# Patient Record
Sex: Male | Born: 1935
Health system: Southern US, Community
[De-identification: ages and names within clinical notes are randomized; demographics above are authoritative.]

## PROBLEM LIST (undated history)

## (undated) DIAGNOSIS — Z9989 Dependence on other enabling machines and devices: Principal | ICD-10-CM

## (undated) DIAGNOSIS — R519 Headache, unspecified: Secondary | ICD-10-CM

## (undated) DIAGNOSIS — F419 Anxiety disorder, unspecified: Secondary | ICD-10-CM

## (undated) DIAGNOSIS — E785 Hyperlipidemia, unspecified: Secondary | ICD-10-CM

## (undated) DIAGNOSIS — J189 Pneumonia, unspecified organism: Secondary | ICD-10-CM

## (undated) DIAGNOSIS — Z85038 Personal history of other malignant neoplasm of large intestine: Secondary | ICD-10-CM

## (undated) DIAGNOSIS — I2510 Atherosclerotic heart disease of native coronary artery without angina pectoris: Secondary | ICD-10-CM

## (undated) DIAGNOSIS — H53002 Unspecified amblyopia, left eye: Secondary | ICD-10-CM

## (undated) DIAGNOSIS — G4733 Obstructive sleep apnea (adult) (pediatric): Secondary | ICD-10-CM

## (undated) DIAGNOSIS — I480 Paroxysmal atrial fibrillation: Secondary | ICD-10-CM

## (undated) DIAGNOSIS — C189 Malignant neoplasm of colon, unspecified: Secondary | ICD-10-CM

## (undated) DIAGNOSIS — I10 Essential (primary) hypertension: Secondary | ICD-10-CM

## (undated) DIAGNOSIS — F32A Depression, unspecified: Secondary | ICD-10-CM

## (undated) DIAGNOSIS — R51 Headache: Secondary | ICD-10-CM

## (undated) DIAGNOSIS — F329 Major depressive disorder, single episode, unspecified: Secondary | ICD-10-CM

## (undated) DIAGNOSIS — G4731 Primary central sleep apnea: Secondary | ICD-10-CM

## (undated) DIAGNOSIS — E662 Morbid (severe) obesity with alveolar hypoventilation: Secondary | ICD-10-CM

## (undated) DIAGNOSIS — M4802 Spinal stenosis, cervical region: Secondary | ICD-10-CM

## (undated) DIAGNOSIS — I509 Heart failure, unspecified: Secondary | ICD-10-CM

## (undated) DIAGNOSIS — M1990 Unspecified osteoarthritis, unspecified site: Secondary | ICD-10-CM

## (undated) DIAGNOSIS — E669 Obesity, unspecified: Secondary | ICD-10-CM

## (undated) HISTORY — DX: Primary central sleep apnea: G47.31

## (undated) HISTORY — PX: HERNIA REPAIR: SHX51

## (undated) HISTORY — DX: Heart failure, unspecified: I50.9

## (undated) HISTORY — PX: EYE SURGERY: SHX253

## (undated) HISTORY — PX: PARTIAL COLECTOMY: SHX5273

## (undated) HISTORY — PX: CATARACT EXTRACTION: SUR2

## (undated) HISTORY — PX: HEMORRHOID SURGERY: SHX153

## (undated) HISTORY — DX: Major depressive disorder, single episode, unspecified: F32.9

## (undated) HISTORY — PX: TONSILLECTOMY: SUR1361

## (undated) HISTORY — DX: Morbid (severe) obesity with alveolar hypoventilation: E66.2

## (undated) HISTORY — DX: Obstructive sleep apnea (adult) (pediatric): G47.33

## (undated) HISTORY — DX: Essential (primary) hypertension: I10

## (undated) HISTORY — DX: Depression, unspecified: F32.A

## (undated) HISTORY — PX: KNEE ARTHROSCOPY W/ MENISCAL REPAIR: SHX1877

## (undated) HISTORY — DX: Hyperlipidemia, unspecified: E78.5

## (undated) HISTORY — PX: HYDROCELE EXCISION / REPAIR: SUR1145

## (undated) HISTORY — DX: Dependence on other enabling machines and devices: Z99.89

---

## 1987-02-23 HISTORY — PX: POLYPECTOMY: SHX149

## 1990-02-22 HISTORY — PX: CORONARY ARTERY BYPASS GRAFT: SHX141

## 1998-07-04 ENCOUNTER — Emergency Department (HOSPITAL_COMMUNITY): Admission: EM | Admit: 1998-07-04 | Discharge: 1998-07-04 | Payer: Self-pay | Admitting: *Deleted

## 2001-01-30 ENCOUNTER — Encounter (INDEPENDENT_AMBULATORY_CARE_PROVIDER_SITE_OTHER): Payer: Self-pay | Admitting: *Deleted

## 2001-01-30 ENCOUNTER — Ambulatory Visit (HOSPITAL_COMMUNITY): Admission: RE | Admit: 2001-01-30 | Discharge: 2001-01-30 | Payer: Self-pay | Admitting: Gastroenterology

## 2002-02-16 ENCOUNTER — Encounter: Payer: Self-pay | Admitting: Emergency Medicine

## 2002-02-16 ENCOUNTER — Emergency Department (HOSPITAL_COMMUNITY): Admission: EM | Admit: 2002-02-16 | Discharge: 2002-02-16 | Payer: Self-pay | Admitting: Emergency Medicine

## 2003-11-26 ENCOUNTER — Ambulatory Visit (HOSPITAL_BASED_OUTPATIENT_CLINIC_OR_DEPARTMENT_OTHER): Admission: RE | Admit: 2003-11-26 | Discharge: 2003-11-26 | Payer: Self-pay | Admitting: Urology

## 2003-12-04 ENCOUNTER — Inpatient Hospital Stay (HOSPITAL_COMMUNITY): Admission: EM | Admit: 2003-12-04 | Discharge: 2003-12-07 | Payer: Self-pay | Admitting: Emergency Medicine

## 2003-12-15 ENCOUNTER — Inpatient Hospital Stay (HOSPITAL_COMMUNITY): Admission: EM | Admit: 2003-12-15 | Discharge: 2003-12-19 | Payer: Self-pay | Admitting: Emergency Medicine

## 2008-01-02 ENCOUNTER — Encounter: Admission: RE | Admit: 2008-01-02 | Discharge: 2008-01-02 | Payer: Self-pay | Admitting: Cardiology

## 2008-01-11 ENCOUNTER — Inpatient Hospital Stay (HOSPITAL_BASED_OUTPATIENT_CLINIC_OR_DEPARTMENT_OTHER): Admission: RE | Admit: 2008-01-11 | Discharge: 2008-01-11 | Payer: Self-pay | Admitting: Cardiology

## 2008-05-16 ENCOUNTER — Encounter (HOSPITAL_COMMUNITY): Admission: RE | Admit: 2008-05-16 | Discharge: 2008-08-14 | Payer: Self-pay | Admitting: Cardiology

## 2008-08-15 ENCOUNTER — Encounter (HOSPITAL_COMMUNITY): Admission: RE | Admit: 2008-08-15 | Discharge: 2008-08-21 | Payer: Self-pay | Admitting: Cardiology

## 2010-07-07 NOTE — Cardiovascular Report (Signed)
NAME:  Vincent Sharp, Vincent Sharp NO.:  [id number]   MEDICAL RECORD NO.:  [id number]          PATIENT TYPE:  OIB   LOCATION:  1966                         FACILITY:  [facility]   PHYSICIAN:  Georga Hacking, M.D.DATE OF BIRTH:  [birth year]   DATE OF PROCEDURE:  01/11/2008  DATE OF DISCHARGE:  01/11/2008                            CARDIAC CATHETERIZATION   HISTORY:  The 77 year old male had bypass grafting in 1992 for single-  vessel disease.  He presented with new onset of exertional angina.   PROCEDURE:  The patient was brought to the cardiac catheterization  laboratory in the same-day diagnostic center, prepped and draped in the  usual manner.  After Xylocaine anesthesia, a 4-French sheath was placed  in the right femoral artery percutaneously with a single anterior needle  wall stick.  The grafts were selected with the right catheter and 4 left  and right catheters were used to select the native coronary arteries and  a 30 mL ventriculogram was performed.  He tolerated procedure well.   HEMODYNAMIC DATA:  Aorta postcontrast 143/78 and LV postcontrast 143/10-  20.   ANGIOGRAPHIC DATA:  Left ventriculogram:  Performed in the 30 degrees  RAO projection.  The aortic valve is normal.  The mitral valve is  normal.  Left ventricle appears normal in size.  Estimated ejection  fraction is 60%.  Coronary arteries arise and distribute normally.  System is codominant and balanced distribution.  There is significant  calcification in the left and right coronary systems.  The left main  coronary artery is calcified with mild irregularities.  The left  anterior descending is occluded proximally.  The intermediate branch  arises and contains scattered irregularities.  A small first marginal  branch arises and has scattered irregularities.  Right coronary has a  proximal 40% stenosis and moderate distal irregularity ending in a  single posterior descending artery.  The saphenous vein graft  to the  obtuse marginal artery is widely patent and fills retrograde a large  posterolateral branch.  The saphenous vein graft to the diagonal branch  is occluded.  The diagonal branch fills by means of a patent mammary  graft and fills retrograde, but there is a stenosis between the diagonal  branch and the insertion site of the mammary graft causing potential  site of ischemia.  The occlusion of the vein graft looks recent.  The  internal mammary graft to the LAD is widely patent.  The distal vessel  has good flow, but there is a stenosis between the insertion site of the  mammary graft and the diagonal branch that is previously bypassed.   IMPRESSION:  1. Probable recent occlusion of the saphenous vein graft to the      diagonal branch, which is unprotected at the present time and a      likely source of ischemia.  2. Patent internal mammary graft to the left anterior descending      coronary artery and a patent saphenous vein graft to the obtuse      marginal.  3. Severe native two-vessel coronary artery disease with mild to  moderate disease involving the right coronary artery.  4. Normal left ventricular function.   RECOMMENDATIONS:  Intensive medical therapy and attention to risk  factors.  Continued medical therapy.  The vessel in question is not  suitable for percutaneous intervention.      Georga Hacking, M.D.  Electronically Signed     WST/MEDQ  D:  01/11/2008  T:  01/12/2008  Job:  [id number]   cc:   Geoffry Paradise, M.D.  Delsa Grana. Andrey Campanile, M.D.

## 2010-07-10 NOTE — H&P (Signed)
NAME:  Vincent Sharp, WANAT NO.:  [id number]   MEDICAL RECORD NO.:  [id number]          PATIENT TYPE:  INP   LOCATION:  0101                         FACILITY:  [facility]   PHYSICIAN:  Lindaann Slough, M.D.  DATE OF BIRTH:  June 06, 1932   DATE OF ADMISSION:  12/15/2003  DATE OF DISCHARGE:                                HISTORY & PHYSICAL   CHIEF COMPLAINT:  Swelling of the scrotum.   The patient is a 77 year old male, who had bilateral hydrocelectomy and  right inguinal hernia repair on November 26, 2003, by Dr. Logan Bores and Dr.  Orson Slick.  The patient had been having swelling of the scrotum since surgery,  and he was readmitted on December 04, 2003, for marked swelling of the  scrotum and confusion.  He was discharged home about 10 days ago, and the  swelling has decreased since; however, for the past 2-3 days he has been  having seropurulent drainage from the right and was drained, and he also has  been having diarrhea.  He has not been eating or drinking fluids.  His  daughter and his wife brought him to the emergency room for reevaluation.   On physical examination, the scrotum is swollen and slightly tender, more on  the right side than the left side.  His scrotal ultrasound showed a  collection of fluid anterior to the testicle with septations.  The patient  is now admitted for further treatment.   PAST MEDICAL HISTORY:  1.  History of heart disease.  2.  History of colon cancer.  3.  Bypass surgery about 15 years ago and hemorrhoidectomy.   FAMILY HISTORY:  Positive for cancer and also prostate cancer, hypertension,  heart disease, and diabetes.   MEDICATIONS:  1.  Cipro, but that was discontinued about 3 days ago because of the      diarrhea.  2.  He is on oral analgesics.   REVIEW OF SYSTEMS:  He has no cough, no shortness of breath, no hemoptysis.  CARDIOVASCULAR:  No palpitation, no chest pain.  GI:  No nausea, but he has  a poor appetite, and he has  diarrhea.   PHYSICAL EXAMINATION:  VITAL SIGNS:  Temperature is 98.5, and blood pressure  is 12/472, pulse 122, and respirations 24.  HEENT:  His tongue is dry.  LUNGS:  Clear.  HEART:  Regular rhythm.  ABDOMEN:  Soft, nondistended, nontender.  He has no CVA tenderness.  Bowel  sounds normal.  He has a healing right inguinal scar.  GU:  Penis is circumcised, and meatus is normal.  Scrotum is enlarged, more  on the right than the left.  He has a Penrose drain on each side of the  scrotum.  There is no drainage from the left side, and there is some  seropurulent drainage from the right side.  The lower part of the scrotum is  fluctuant and nontender.  Cannot palpate either testicle because of the  swelling of the scrotum.   His hemoglobin is 12, hematocrit 35.2, and WBC 13.3.  Platelet count is 428.  Neutrophil is 87%.  Sodium is  133, potassium 3.4, glucose 137, BUN 24,  creatinine 1.2.   IMPRESSION:  1.  Status post bilateral hydrocelectomy, status post right inguinal hernia      repair.  2.  Scrotal hematoma.  3.  Possible scrotal abscess.      MN/MEDQ  D:  12/15/2003  T:  12/15/2003  Job:  [id number]

## 2010-07-10 NOTE — Op Note (Signed)
NAME:  Vincent Sharp, Vincent Sharp NO.:  [id number]   MEDICAL RECORD NO.:  [id number]          PATIENT TYPE:  AMB   LOCATION:  [facility]C                         FACILITY:  [facility]   PHYSICIAN:  Lorre Munroe., M.D.DATE OF BIRTH:  02-Feb-1933   DATE OF PROCEDURE:  11/26/2003  DATE OF DISCHARGE:                                 OPERATIVE REPORT   PREOPERATIVE DIAGNOSIS:  Indirect right inguinal hernia.   POSTOPERATIVE DIAGNOSIS:  Indirect right inguinal hernia.   OPERATION:  Repair of right inguinal hernia.   SURGEON:  Lebron Conners, M.D.   ASSISTANT:  Jamison Neighbor, M.D.   ANESTHESIA:  General.   PROCEDURE:  Dr. Logan Bores discovered a large inguinal hernia when he was  excising a hydrocele and repairing a varicocele of the right scrotum on Mr.  Vincent Sharp.  He asked me to scrub in and assess and take care of the hernia.  I  noted that there was a scrotal component of this hernia containing a large  amount of fat.  After Dr. Logan Bores finished his procedure, I made a standard  incision about 7 cm long in the right inguinal region after routine  preparation and draping of the skin.  I liberally used 0.5% Marcaine with  epinephrine local anesthesia to provide a long-lasting block after surgery.  I deepened the dissection through the fat until I encountered the external  oblique and found that I could easily see the superficial ring which was  greatly expanded by the large hernia.  I opened the superficial ring further  by cutting along the fibers of the external oblique aponeurosis, and then  exposed the hernia and spermatic cord.  Spermatic cord was so greatly  expanded that it was very difficult to dissect around it at the level of the  pubic tubercle, so I incised it longitudinally in the proximal aspect of it  and dissected out the large hernia which appeared to contain mostly omentum.  I took care to avoid injuring the ilioinguinal nerve, the vas deferens, and  the cord vessels.   After dissecting the hernia free from those structures, I  reduced it through the deep ring and placed a generous plug of polypropylene  mesh in the deep ring and sewed that in with 2-0 silk, sewing into the  internal oblique muscle on each side.  That held it in nicely.  I then cut  cremaster fibers further and demonstrated the pubic tubercle, shelving edge  of the inguinal ligament, and the superficial fascia of the internal  oblique, and the deep ring and cord.  I then fashioned a generous patch of  polypropylene mesh, cut a slit in it to allow exit of the spermatic cord  into the scrotum, and sewed that in from the pubic tubercle laterally and  inferiorly with running stitch in the inguinal ligament, and superiorly and  medially with running basting suture in the superficial fascia of the  internal oblique.  I used 2-0 Prolene for this suture, and I sutured the  tails of the mesh together lateral to the cord and deep ring with  two sutures  of 2-0 Prolene.  I felt that the repair was secure.  I added  more local anesthetic, then closed the external oblique and subcutaneous  tissues with running 3-0 Vicryl and closed the skin with intracuticular 4-0  Vicryl and Steri-Strips.  The patient was stable through the procedure.      WB/MEDQ  D:  11/26/2003  T:  11/26/2003  Job:  [id number]

## 2010-07-10 NOTE — Op Note (Signed)
NAME:  Vincent Sharp, Vincent Sharp NO.:  [id number]   MEDICAL RECORD NO.:  [id number]          PATIENT TYPE:  INP   LOCATION:  0101                         FACILITY:  [facility]   PHYSICIAN:  Lindaann Slough, M.D.  DATE OF BIRTH:  [birth year]   DATE OF PROCEDURE:  12/15/2003  DATE OF DISCHARGE:                                 OPERATIVE REPORT   PREPROCEDURE DIAGNOSIS:  Scrotal abscess.   POSTPROCEDURE DIAGNOSIS:  Scrotal abscess.   PROCEDURE DONE:  Incision and drainage of scrotal abscess.   INDICATION:  The patient is a 77 year old male, who had bilateral  hydrocelectomy on November 26, 2003 and repair of right inguinal hernia at the  same time.  He had been complaining of swelling of the scrotum and was  readmitted on December 04, 2003, and was treated with antibiotics.  The  swelling had decreased in size; however, for the past 2-3 days, he has been  having some drainage from the right scrotum, and the Penrose drain on the  left side is dry, but there is some seropurulent drainage from the right  side.  Scrotal ultrasound done this morning showed a collection of fluid  anterior to the testis with septations.  The scrotum is fluctuant and  tender.  The patient is scheduled for incision and drainage of the scrotum.   The scrotum was then prepped and draped, and the scrotum was infiltrated  with 1% lidocaine, and a longitudinal incision was made over fluctuant area,  and some purulent material was drained out of that incision.  The incision  was then extended, and a large amount of purulent material was drained out  of the scrotum.  Culture of the abscess was done.  Then, the wound was  packed with Iodoform gauze.   The patient tolerated the procedure well.      MN/MEDQ  D:  12/15/2003  T:  12/15/2003  Job:  [id number]

## 2010-07-10 NOTE — Discharge Summary (Signed)
NAME:  Vincent Sharp, Vincent Sharp NO.:  [id number]   MEDICAL RECORD NO.:  [id number]          PATIENT TYPE:  INP   LOCATION:  0361                         FACILITY:  [facility]   PHYSICIAN:  Jamison Neighbor, M.D.  DATE OF BIRTH:  1932/04/08   DATE OF ADMISSION:  12/04/2003  DATE OF DISCHARGE:  12/07/2003                                 DISCHARGE SUMMARY   DISCHARGE DIAGNOSES:  1.  Sleep apnea.  2.  Coronary artery disease.  3.  Venous insufficiency.  4.  Postoperative hematoma following recent hematoma following      hernia/hydrocele surgery.   HISTORY:  This 77 year old male was admitted to the hospital because he  developed significant problems with sleep apnea after he took medications  following recent bilateral hydrocele surgery. The patient became quite  hypoxic and presented to my evidence with clear-cut evidence of sleep apnea.  The family noted that the patient had been told in the past that he should  evaluated but has never had a formal diagnosis and has never been willing to  take anything for this. Once the patient become somewhat obtundent from his  pain medication, he clearly became hypoxic, and it was felt by me that when  I saw him in the office that he should be admitted to the hospital for  further evaluation.   The patient's past medical history, family history, social history, and  review of systems are well delineated in the dictated history and physical.   The patient was given a PCA pump to get his pain under better control and to  stop him from taking oral pain medications. He was started on oxygen and a  pulmonary consult was obtained. He felt it was clear-cut evidence of sleep  apnea and arranged for him to obtain a CPAP machine. The patient felt  significantly better and said this is the first time he has slept in many,  many years. He said that the CPAP worked quite nicely for him. The patient  still has a significant scrotal hematoma, but it is  felt that this would  resolve with time and that the best thing to do is to treat his hypoxia,  take care of the issues having to do with his sleep apnea, and worry about  the hematoma if it does not resolve on its own. During the hospital stay,  the medical service checked his D-dimer which was elevated, but I did not  wish to have the patient go on Lovenox because of the recent problems with  scrotal hematoma. He was ambulated regularly. He was on stockings and was  carefully monitored, and there did not appear to be any need for him to go  on anticoagulants. The patient was sent home on December 07, 2003. He was  encouraged to use scrotal  support as well as to continue to use his diuretics because he did appear to  be somewhat fluid overloaded. He was going on home oxygen which would be  monitored. He was given Flomax to help with his voiding and is maintained on  Cipro. He will return in  2 weeks for followup.      RJE/MEDQ  D:  12/30/2003  T:  12/30/2003  Job:  [id number]   cc:   Geoffry Paradise, M.D.  [address]  [place]  [place] [address]  Fax: [phone]

## 2010-07-10 NOTE — Discharge Summary (Signed)
NAME:  Vincent Sharp, Vincent Sharp NO.:  [id number]   MEDICAL RECORD NO.:  [id number]          PATIENT TYPE:  INP   LOCATION:  0359                         FACILITY:  [facility]   PHYSICIAN:  Jamison Neighbor, M.D.  DATE OF BIRTH:  December 02, 1932   DATE OF ADMISSION:  12/15/2003  DATE OF DISCHARGE:  12/19/2003                                 DISCHARGE SUMMARY   DISCHARGE DIAGNOSES:  1.  Infected hematoma, right scrotum.  2.  Group B streptococcal infection.  3.  Coronary artery disease.  4.  Past history of angioplasty.   OPERATION/PROCEDURE:  Drainage of the scrotal abscess by Dr. Brunilda Payor.   HISTORY:  This 77 year old male underwent bilateral hydrocelectomy and right  hernia repair on October 4.  The patient developed some postoperative  swelling and was admitted for postoperative pain management as well as for  treatment of some issues that he had with sleep apnea.  The patient felt  better with discharge.  On the day of this admission, he had developed some  drainage on that side.  He felt like he had gotten worse and was admitted by  Dr. Brunilda Payor.  The patient was found to have a collection on ultrasound with  septations and Dr. Brunilda Payor drained this area.   PAST MEDICAL HISTORY:  Remains unchanged.  He has a history of heart disease  and colon cancer.  Had a bypass surgery 15 years ago.  Has also had a  history of hemorrhoidectomy.   HOSPITAL COURSE:  The patient felt much better after drainage of that area.  He was carefully followed and the wound cleaned up very nicely.  He still  has some residual __________  but it is felt at this point that it will  clear up over time.  The patient did have wound and blood cultures  pending.  The patient blood cultures was negative.  The wound culture grew strep.  The  patient remained afebrile and was ready for discharge on December 19, 2003.  He will follow up with [facility] as well as in my office.      RJE/MEDQ  D:   01/10/2004  T:  01/11/2004  Job:  [id number]

## 2010-07-10 NOTE — Op Note (Signed)
NAME:  Vincent Sharp, Vincent Sharp NO.:  [id number]   MEDICAL RECORD NO.:  [id number]          PATIENT TYPE:  AMB   LOCATION:  [facility]C                         FACILITY:  [facility]   PHYSICIAN:  Jamison Neighbor, M.D.  DATE OF BIRTH:  09-27-1932   DATE OF PROCEDURE:  11/26/2003  DATE OF DISCHARGE:                                 OPERATIVE REPORT   PREOPERATIVE DIAGNOSES:  1.  Bilateral hydrocele.  2.  Right varicocele.   POSTOPERATIVE DIAGNOSES:  1.  Bilateral hydrocele.  2.  Right varicocele.  3.  Right inguinal hernia.   PROCEDURE:  Bilateral hydrocelectomy, right varicocelectomy, and right  inguinal hernia repair.   SURGEON:  Jamison Neighbor, M.D., hydrocele and varicocele repair.   SURGEON:  Lebron Conners, M.D., hernia repair, with Dr. Logan Bores assisting.   ANESTHESIA:  General with local infiltration.   COMPLICATIONS:  None.   DRAINS:  Bilateral Penrose drain in each hemiscrotum.   HISTORY:  This 77 year old male presented to the office with a massive  hydrocele.  This is primarily on the left-hand side, but there did appear to  be a bilateral component to this.  The patient's scrotum was so swollen he  was having a difficult time walking, and it is felt that it would be  appropriate for him to undergo surgical correction of the hydrocele.  The  patient is known to have a varicocele with some varicosities on the right-  hand side.  He was told that this could be repaired at the time of  procedure.  The patient gave full informed consent for the procedure.   PROCEDURE:  After successful induction of general anesthesia, the patient  was placed in the supine position, prepped with Betadine and draped in the  usual sterile fashion.  A midline incision was made and was carried down  through the contents of the right hemiscrotum.  The patient was found to  have a hydrocele on that side, but it was no where near as large as had been  expected.  The hydrocele sac was opened  and everted in preparation for  closure.  The patient did have some varicosities that were taken down, with  care taken to avoid injury to the vas or to parts of the testicle.  The  hydrocele sac was oversewn with a running suture of 3-0 Vicryl.  As the sac  was taken down and drained, it became clear there was additional fatty  tissue above the hydrocele sac, as that was carefully explored.  It became  clear that this was some omentum that had fallen down through a hernia sac.  This was reduced.  The sac could easily be identified separate from the  hydrocele.  General surgery was contacted to repair this.  The patient's  left hydrocele was exposed by making a small incision across the septum  through this same midline incision.  The hydrocele was then stripped away  from the surrounding tissue.  It was opened, everted.  The edges were  trimmed up, and it was over-sewn in the usual fashion with a running,  locking suture  of 3-0 Vicryl.  The testicle and the everted hydrocele were  turned back into the left hemiscrotum.  The opening in the septum was then  closed with a running suture of 3-0 Vicryl.  The hernia was reduced.  The  hydrocele sac was closed on that side, as was the opening in the dartos  layer.  The skin was then closed with a running suture of 3-0 chromic.  The  patient had Penrose drains placed bilaterally.  These were sutured in place  with a Vicryl stitch.  The patient was then reprepped and draped.  Dr.  Orson Slick made an incision.  He reduced the hernia with care taken to avoid  injury to the cord.  He then eventually placed the hernia structures back  into their abdominal contents using a plug system as well as a mesh repair.  He will dictate that portion of the procedure as well as the closure.  The  patient did have collodion placed on the scrotal incision.  He was given  scrotal support and ice.  The patient tolerated the procedure well and was  taken to the recovery  room in good condition.      RJE/MEDQ  D:  11/26/2003  T:  11/26/2003  Job:  [id number]   cc:   Vale Haven. Andrey Campanile, M.D.  [address]  [place]  [place] [address]  Fax: [phone]

## 2010-07-10 NOTE — H&P (Signed)
NAME:  Vincent Sharp, Vincent Sharp NO.:  [id number]   MEDICAL RECORD NO.:  [id number]          PATIENT TYPE:  INP   LOCATION:  0361                         FACILITY:  [facility]   PHYSICIAN:  Jamison Neighbor, M.D.  DATE OF BIRTH:  1932-08-01   DATE OF ADMISSION:  12/04/2003  DATE OF DISCHARGE:  12/07/2003                                HISTORY & PHYSICAL   ADMISSION DIAGNOSES:  1.  Scrotal hematoma.  2.  Sleep apnea.  3.  Coronary artery disease.  4.  Past history of chronic malignancy.  5.  Venous insufficiency.   HISTORY:  This 77 year old male was taken to the operating room on an  outpatient basis for a repair of two large hydroceles. The patient was found  to have a large hydrocele bilaterally, but unexpectedly behind the hydrocele  on the right hand side was found to have a hernia. He underwent groin  exploration by Dr. __________ with repair of the hernia. The patient  appeared stable and was sent home. He did develop the usual postoperative  swelling but presented back to the office on the day of admission with  increasing problems with somnolence. The patient has been taking pain  medication and trying to get his pain under control and became quite clear  that he was laboring to breath and quite hypoxic. Discussion with the family  revealed the fact that the patient had been having breathing problems for  some time but had refused any workup and had never had any formal diagnosis  of sleep apnea but clearly had all of the signs and symptoms of that  condition. Because the patient had poor pain control and really appeared to  be struggling, he was sent to the emergency room where a pulse oximeter was  obtained which revealed definite hypoxia. The patient was started on oxygen  and was admitted for pain control.   The patient's past medical history is remarkable for coronary artery  disease. He had bypass surgery x3 back in 1992. His only other previous  surgery was  the hemorrhoidectomy in 1989. He is known to have varicose veins  in the legs and some problems with circulation, particularly on that right  hand side. He is known to have some peripheral arthritis but no other  significant health problems.   He was on no medications other than pain medications at the time of  admission.   ALLERGIES:  He had no known allergies.   The patient does not use alcohol and has had no tobacco since high school.   PHYSICAL EXAMINATION:  GENERAL:  His oxygen saturation is 99%, but when he  was examined in the emergency room, he was 89%. He is afebrile. Pulse is 93,  respirations 18, blood pressure 157/69. He is a well-developed, well-  nourished male, quite large in size with an estimated weight of 250. He is  dehydrated and clearly anxious and having a difficult time with intermittent  episodes of apnea. In the emergency room, he could be seen to stop breathing  temporarily and then start breathing again on his own.  HEENT:  Normocephalic, atraumatic. Cranial nerves II-XII grossly intact.  NECK:  Supple without adenopathy or thyromegaly. His mucous membranes are  dry.  LUNGS:  Clear.  HEART:  Regular rate and rhythm with no murmurs, thrills, gallops, rubs, or  heaves.  ABDOMEN:  Soft, nontender, with no palpable masses, rebound, or guarding.  The patient has significant scrotal edema. He still has Penrose drains in  place. The tissue is quite thickened and edematous. There is no drainage at  this point. The penis is free of any lesions.  EXTREMITIES:  Pertinent for some varicose veins on the right hand side.   IMPRESSION:  1.  Sleep apnea exacerbated by recent surgery and pain medication use.  2.  Postoperative hematoma following recent scrotal surgery.   PLAN:  Admit for rehydration, evaluation of hematoma, and pulmonary  consultation to work on his sleep apnea problem.      RJE/MEDQ  D:  12/30/2003  T:  12/30/2003  Job:  [id number]   cc:   Geoffry Paradise, M.D.  [address]  [place]  [place] [address]  Fax: [phone]

## 2010-09-10 ENCOUNTER — Emergency Department (HOSPITAL_COMMUNITY): Payer: Medicare Other

## 2010-09-10 ENCOUNTER — Emergency Department (HOSPITAL_COMMUNITY)
Admission: EM | Admit: 2010-09-10 | Discharge: 2010-09-10 | Disposition: A | Discharge status: Home / Self Care | Payer: Medicare Other | Attending: Emergency Medicine | Admitting: Emergency Medicine

## 2010-09-10 DIAGNOSIS — Z85038 Personal history of other malignant neoplasm of large intestine: Secondary | ICD-10-CM | POA: Insufficient documentation

## 2010-09-10 DIAGNOSIS — R339 Retention of urine, unspecified: Secondary | ICD-10-CM | POA: Insufficient documentation

## 2010-09-10 DIAGNOSIS — R109 Unspecified abdominal pain: Secondary | ICD-10-CM | POA: Insufficient documentation

## 2010-09-10 DIAGNOSIS — I10 Essential (primary) hypertension: Secondary | ICD-10-CM | POA: Insufficient documentation

## 2010-09-10 DIAGNOSIS — R319 Hematuria, unspecified: Secondary | ICD-10-CM | POA: Insufficient documentation

## 2010-09-10 DIAGNOSIS — K5730 Diverticulosis of large intestine without perforation or abscess without bleeding: Secondary | ICD-10-CM | POA: Insufficient documentation

## 2010-09-10 DIAGNOSIS — I25810 Atherosclerosis of coronary artery bypass graft(s) without angina pectoris: Secondary | ICD-10-CM | POA: Insufficient documentation

## 2010-09-10 DIAGNOSIS — K8020 Calculus of gallbladder without cholecystitis without obstruction: Secondary | ICD-10-CM | POA: Insufficient documentation

## 2010-09-10 DIAGNOSIS — N419 Inflammatory disease of prostate, unspecified: Secondary | ICD-10-CM | POA: Insufficient documentation

## 2010-09-10 LAB — BASIC METABOLIC PANEL
BUN: 25 mg/dL — ABNORMAL HIGH (ref 6–23)
CO2: 29 mEq/L (ref 19–32)
Glucose, Bld: 109 mg/dL — ABNORMAL HIGH (ref 70–99)
Potassium: 4.4 mEq/L (ref 3.5–5.1)
Sodium: 141 mEq/L (ref 135–145)

## 2010-09-10 LAB — CBC
Hemoglobin: 14 g/dL (ref 13.0–17.0)
MCH: 31.7 pg (ref 26.0–34.0)
RBC: 4.42 MIL/uL (ref 4.22–5.81)
WBC: 7 10*3/uL (ref 4.0–10.5)

## 2010-09-10 LAB — URINE MICROSCOPIC-ADD ON

## 2010-09-10 LAB — DIFFERENTIAL
Basophils Absolute: 0 10*3/uL (ref 0.0–0.1)
Basophils Relative: 0 % (ref 0–1)
Lymphocytes Relative: 8 % — ABNORMAL LOW (ref 12–46)
Monocytes Relative: 7 % (ref 3–12)
Neutro Abs: 5.9 10*3/uL (ref 1.7–7.7)

## 2010-09-10 LAB — URINALYSIS, ROUTINE W REFLEX MICROSCOPIC
Glucose, UA: NEGATIVE mg/dL
Ketones, ur: NEGATIVE mg/dL
Specific Gravity, Urine: 1.015 (ref 1.005–1.030)
pH: 5.5 (ref 5.0–8.0)

## 2010-09-11 LAB — URINE CULTURE
Culture  Setup Time: 201207191533
Culture: NO GROWTH

## 2010-09-23 ENCOUNTER — Ambulatory Visit (HOSPITAL_COMMUNITY): Payer: Medicare Other | Attending: Orthopedic Surgery

## 2010-09-23 ENCOUNTER — Ambulatory Visit (HOSPITAL_BASED_OUTPATIENT_CLINIC_OR_DEPARTMENT_OTHER)
Admission: RE | Admit: 2010-09-23 | Discharge: 2010-09-23 | Disposition: A | Discharge status: Home / Self Care | Payer: Medicare Other | Source: Ambulatory Visit | Attending: Orthopedic Surgery | Admitting: Orthopedic Surgery

## 2010-09-23 DIAGNOSIS — E669 Obesity, unspecified: Secondary | ICD-10-CM | POA: Insufficient documentation

## 2010-09-23 DIAGNOSIS — I498 Other specified cardiac arrhythmias: Secondary | ICD-10-CM | POA: Insufficient documentation

## 2010-09-23 DIAGNOSIS — Z951 Presence of aortocoronary bypass graft: Secondary | ICD-10-CM | POA: Insufficient documentation

## 2010-09-23 DIAGNOSIS — M2390 Unspecified internal derangement of unspecified knee: Secondary | ICD-10-CM | POA: Insufficient documentation

## 2010-09-23 DIAGNOSIS — Z01818 Encounter for other preprocedural examination: Secondary | ICD-10-CM | POA: Insufficient documentation

## 2010-09-23 DIAGNOSIS — I4510 Unspecified right bundle-branch block: Secondary | ICD-10-CM | POA: Insufficient documentation

## 2010-09-23 DIAGNOSIS — M23329 Other meniscus derangements, posterior horn of medial meniscus, unspecified knee: Secondary | ICD-10-CM | POA: Insufficient documentation

## 2010-09-23 DIAGNOSIS — Z01810 Encounter for preprocedural cardiovascular examination: Secondary | ICD-10-CM | POA: Insufficient documentation

## 2010-09-23 DIAGNOSIS — Z01812 Encounter for preprocedural laboratory examination: Secondary | ICD-10-CM | POA: Insufficient documentation

## 2010-09-23 DIAGNOSIS — M23359 Other meniscus derangements, posterior horn of lateral meniscus, unspecified knee: Secondary | ICD-10-CM | POA: Insufficient documentation

## 2010-09-23 DIAGNOSIS — G4733 Obstructive sleep apnea (adult) (pediatric): Secondary | ICD-10-CM | POA: Insufficient documentation

## 2010-09-23 DIAGNOSIS — I10 Essential (primary) hypertension: Secondary | ICD-10-CM | POA: Insufficient documentation

## 2010-09-23 DIAGNOSIS — I440 Atrioventricular block, first degree: Secondary | ICD-10-CM | POA: Insufficient documentation

## 2010-09-23 DIAGNOSIS — I2510 Atherosclerotic heart disease of native coronary artery without angina pectoris: Secondary | ICD-10-CM | POA: Insufficient documentation

## 2010-10-05 NOTE — Op Note (Signed)
NAME:  Vincent Sharp, Vincent Sharp NO.:  [id number]  MEDICAL RECORD NO.:  [id number]  LOCATION:                                 FACILITY:  PHYSICIAN:  Ollen Gross, M.D.    DATE OF BIRTH:  26-Jun-1932  DATE OF PROCEDURE:  09/23/2010 DATE OF DISCHARGE:                              OPERATIVE REPORT   PREOPERATIVE DIAGNOSIS:  Right knee medial and lateral meniscal tears.  POSTOPERATIVE DIAGNOSES:  Right knee medial and lateral meniscal tears plus chondral defect medial.  PROCEDURE:  Right knee arthroscopy with medial and lateral meniscal debridement and chondroplasty medial femoral condyle.  SURGEON:  Ollen Gross, MD  ASSISTANT:  None.  ANESTHESIA:  General.  ESTIMATED BLOOD LOSS:  Minimal.  DRAIN:  None.  COMPLICATIONS:  None.  CONDITION:  Stable to Recovery.  BRIEF CLINICAL NOTE:  [name] is a 77 year old male with several- month history of significant right knee pain and mechanical symptoms. Exam and history suggest a meniscal tear, confirmed by MRI.  He presents for arthroscopy and debridement.  PROCEDURE IN DETAIL:  After successful administration of general anesthetic, a tourniquet was placed high on his right thigh and his right lower extremity was prepped and draped in usual sterile fashion. Standard superomedial and inferolateral incisions were made, inflow cannula passed superomedial and camera passed inferolateral. Arthroscopic visualization proceeds.  Undersurface of the patella and trochlea had some grade 2 changes, but no full-thickness chondral defects.  Medial and lateral gutters were visualized, there were no loose bodies.  Flexion and valgus force was applied to the knee and the medial compartment was entered.  He has a bad tear in the body and posterior horn of the medial meniscus.  He also has an area about 1 x 1 cm where the cartilage is coming off bone.  Spinal needle was used to localize the inferomedial portal, small incision  made, and dilator placed.  This was debrided back to stable base with baskets and a 4.2 mm shaver and then sealed off with the ArthroCare.  The unstable cartilage on the surface of medial femoral condyle was debrided back to stable bony base with stable cartilaginous edges.  Overall size was about 1 x 1 cm.  The rest of the condyle looked normal.  Intercondylar notch was visualized, ACL was intact.  Lateral compartment was entered, it has got significant degenerative tear body and posterior horn of the lateral meniscus.  It was debrided back to stable base with baskets and a 4.2 mm shaver and sealed off with the ArthroCare.  The lateral compartment otherwise looked normal.  The joint was again inspected, no other tears, defects, or loose bodies were noted.  Arthroscopic equipments were removed from the inferior portals which were closed with interrupted 4-0 nylon.  A 20 cc of 0.25% Marcaine with epinephrine injected through the inflow cannula, then that was removed and that portal closed with nylon. Incisions were cleaned and dried and a bulky sterile dressing applied. He was then awakened and transported to Recovery in stable condition.     Ollen Gross, M.D.     FA/MEDQ  D:  09/23/2010  T:  09/24/2010  Job:  [id number]  Electronically Signed  by Ollen Gross M.D. on 10/05/2010 11:38:24 AM

## 2011-03-02 DIAGNOSIS — E785 Hyperlipidemia, unspecified: Secondary | ICD-10-CM | POA: Diagnosis not present

## 2011-03-02 DIAGNOSIS — I10 Essential (primary) hypertension: Secondary | ICD-10-CM | POA: Diagnosis not present

## 2011-03-02 DIAGNOSIS — G4733 Obstructive sleep apnea (adult) (pediatric): Secondary | ICD-10-CM | POA: Diagnosis not present

## 2011-03-02 DIAGNOSIS — I2510 Atherosclerotic heart disease of native coronary artery without angina pectoris: Secondary | ICD-10-CM | POA: Diagnosis not present

## 2011-03-02 DIAGNOSIS — Z23 Encounter for immunization: Secondary | ICD-10-CM | POA: Diagnosis not present

## 2011-04-08 DIAGNOSIS — B0059 Other herpesviral disease of eye: Secondary | ICD-10-CM | POA: Diagnosis not present

## 2011-04-08 DIAGNOSIS — H5710 Ocular pain, unspecified eye: Secondary | ICD-10-CM | POA: Diagnosis not present

## 2011-04-29 DIAGNOSIS — H40019 Open angle with borderline findings, low risk, unspecified eye: Secondary | ICD-10-CM | POA: Diagnosis not present

## 2011-04-29 DIAGNOSIS — H04129 Dry eye syndrome of unspecified lacrimal gland: Secondary | ICD-10-CM | POA: Diagnosis not present

## 2011-04-29 DIAGNOSIS — B0059 Other herpesviral disease of eye: Secondary | ICD-10-CM | POA: Diagnosis not present

## 2011-08-03 DIAGNOSIS — I10 Essential (primary) hypertension: Secondary | ICD-10-CM | POA: Diagnosis not present

## 2011-08-03 DIAGNOSIS — E785 Hyperlipidemia, unspecified: Secondary | ICD-10-CM | POA: Diagnosis not present

## 2011-08-03 DIAGNOSIS — R0609 Other forms of dyspnea: Secondary | ICD-10-CM | POA: Diagnosis not present

## 2011-08-03 DIAGNOSIS — I2510 Atherosclerotic heart disease of native coronary artery without angina pectoris: Secondary | ICD-10-CM | POA: Diagnosis not present

## 2011-08-03 DIAGNOSIS — I25810 Atherosclerosis of coronary artery bypass graft(s) without angina pectoris: Secondary | ICD-10-CM | POA: Diagnosis not present

## 2011-08-03 DIAGNOSIS — E669 Obesity, unspecified: Secondary | ICD-10-CM | POA: Diagnosis not present

## 2011-08-03 DIAGNOSIS — R0989 Other specified symptoms and signs involving the circulatory and respiratory systems: Secondary | ICD-10-CM | POA: Diagnosis not present

## 2011-08-19 DIAGNOSIS — Z125 Encounter for screening for malignant neoplasm of prostate: Secondary | ICD-10-CM | POA: Diagnosis not present

## 2011-08-19 DIAGNOSIS — E785 Hyperlipidemia, unspecified: Secondary | ICD-10-CM | POA: Diagnosis not present

## 2011-08-19 DIAGNOSIS — I10 Essential (primary) hypertension: Secondary | ICD-10-CM | POA: Diagnosis not present

## 2011-08-30 DIAGNOSIS — I2510 Atherosclerotic heart disease of native coronary artery without angina pectoris: Secondary | ICD-10-CM | POA: Diagnosis not present

## 2011-08-30 DIAGNOSIS — Z23 Encounter for immunization: Secondary | ICD-10-CM | POA: Diagnosis not present

## 2011-08-30 DIAGNOSIS — I10 Essential (primary) hypertension: Secondary | ICD-10-CM | POA: Diagnosis not present

## 2011-08-30 DIAGNOSIS — Z125 Encounter for screening for malignant neoplasm of prostate: Secondary | ICD-10-CM | POA: Diagnosis not present

## 2011-08-30 DIAGNOSIS — E785 Hyperlipidemia, unspecified: Secondary | ICD-10-CM | POA: Diagnosis not present

## 2011-08-30 DIAGNOSIS — Z0000 Encounter for general adult medical examination without abnormal findings: Secondary | ICD-10-CM | POA: Diagnosis not present

## 2011-10-04 DIAGNOSIS — Z86010 Personal history of colonic polyps: Secondary | ICD-10-CM | POA: Diagnosis not present

## 2011-10-04 DIAGNOSIS — K5730 Diverticulosis of large intestine without perforation or abscess without bleeding: Secondary | ICD-10-CM | POA: Diagnosis not present

## 2011-10-04 DIAGNOSIS — D126 Benign neoplasm of colon, unspecified: Secondary | ICD-10-CM | POA: Diagnosis not present

## 2011-10-04 DIAGNOSIS — K648 Other hemorrhoids: Secondary | ICD-10-CM | POA: Diagnosis not present

## 2011-10-27 DIAGNOSIS — K625 Hemorrhage of anus and rectum: Secondary | ICD-10-CM | POA: Diagnosis not present

## 2011-10-27 DIAGNOSIS — K648 Other hemorrhoids: Secondary | ICD-10-CM | POA: Diagnosis not present

## 2011-11-10 DIAGNOSIS — K648 Other hemorrhoids: Secondary | ICD-10-CM | POA: Diagnosis not present

## 2011-11-24 DIAGNOSIS — K648 Other hemorrhoids: Secondary | ICD-10-CM | POA: Diagnosis not present

## 2011-12-27 DIAGNOSIS — H04129 Dry eye syndrome of unspecified lacrimal gland: Secondary | ICD-10-CM | POA: Diagnosis not present

## 2011-12-27 DIAGNOSIS — H43399 Other vitreous opacities, unspecified eye: Secondary | ICD-10-CM | POA: Diagnosis not present

## 2011-12-27 DIAGNOSIS — H40019 Open angle with borderline findings, low risk, unspecified eye: Secondary | ICD-10-CM | POA: Diagnosis not present

## 2012-01-04 DIAGNOSIS — K648 Other hemorrhoids: Secondary | ICD-10-CM | POA: Diagnosis not present

## 2012-01-04 DIAGNOSIS — K623 Rectal prolapse: Secondary | ICD-10-CM | POA: Diagnosis not present

## 2012-01-04 DIAGNOSIS — K625 Hemorrhage of anus and rectum: Secondary | ICD-10-CM | POA: Diagnosis not present

## 2012-02-02 DIAGNOSIS — K648 Other hemorrhoids: Secondary | ICD-10-CM | POA: Diagnosis not present

## 2012-02-02 DIAGNOSIS — K623 Rectal prolapse: Secondary | ICD-10-CM | POA: Diagnosis not present

## 2012-03-01 DIAGNOSIS — I2510 Atherosclerotic heart disease of native coronary artery without angina pectoris: Secondary | ICD-10-CM | POA: Diagnosis not present

## 2012-03-01 DIAGNOSIS — E785 Hyperlipidemia, unspecified: Secondary | ICD-10-CM | POA: Diagnosis not present

## 2012-03-01 DIAGNOSIS — I10 Essential (primary) hypertension: Secondary | ICD-10-CM | POA: Diagnosis not present

## 2012-03-01 DIAGNOSIS — M1990 Unspecified osteoarthritis, unspecified site: Secondary | ICD-10-CM | POA: Diagnosis not present

## 2012-06-23 DIAGNOSIS — I2510 Atherosclerotic heart disease of native coronary artery without angina pectoris: Secondary | ICD-10-CM | POA: Diagnosis not present

## 2012-06-23 DIAGNOSIS — I10 Essential (primary) hypertension: Secondary | ICD-10-CM | POA: Diagnosis not present

## 2012-06-23 DIAGNOSIS — M542 Cervicalgia: Secondary | ICD-10-CM | POA: Diagnosis not present

## 2012-06-23 DIAGNOSIS — M1990 Unspecified osteoarthritis, unspecified site: Secondary | ICD-10-CM | POA: Diagnosis not present

## 2012-06-23 DIAGNOSIS — R42 Dizziness and giddiness: Secondary | ICD-10-CM | POA: Diagnosis not present

## 2012-06-23 DIAGNOSIS — IMO0002 Reserved for concepts with insufficient information to code with codable children: Secondary | ICD-10-CM | POA: Diagnosis not present

## 2012-06-26 DIAGNOSIS — M949 Disorder of cartilage, unspecified: Secondary | ICD-10-CM | POA: Diagnosis not present

## 2012-06-26 DIAGNOSIS — M899 Disorder of bone, unspecified: Secondary | ICD-10-CM | POA: Diagnosis not present

## 2012-06-26 DIAGNOSIS — M47812 Spondylosis without myelopathy or radiculopathy, cervical region: Secondary | ICD-10-CM | POA: Diagnosis not present

## 2012-07-06 ENCOUNTER — Other Ambulatory Visit (HOSPITAL_COMMUNITY): Payer: Self-pay | Admitting: Internal Medicine

## 2012-07-06 DIAGNOSIS — N889 Noninflammatory disorder of cervix uteri, unspecified: Secondary | ICD-10-CM

## 2012-07-18 ENCOUNTER — Encounter (HOSPITAL_COMMUNITY)
Admission: RE | Admit: 2012-07-18 | Discharge: 2012-07-18 | Disposition: A | Discharge status: Home / Self Care | Payer: Medicare Other | Source: Ambulatory Visit | Attending: Internal Medicine | Admitting: Internal Medicine

## 2012-07-18 DIAGNOSIS — Z85038 Personal history of other malignant neoplasm of large intestine: Secondary | ICD-10-CM | POA: Insufficient documentation

## 2012-07-18 DIAGNOSIS — R42 Dizziness and giddiness: Secondary | ICD-10-CM | POA: Insufficient documentation

## 2012-07-18 DIAGNOSIS — M25569 Pain in unspecified knee: Secondary | ICD-10-CM | POA: Insufficient documentation

## 2012-07-18 DIAGNOSIS — M542 Cervicalgia: Secondary | ICD-10-CM | POA: Diagnosis not present

## 2012-07-18 DIAGNOSIS — N889 Noninflammatory disorder of cervix uteri, unspecified: Secondary | ICD-10-CM

## 2012-07-18 DIAGNOSIS — Z9181 History of falling: Secondary | ICD-10-CM | POA: Diagnosis not present

## 2012-07-18 MED ORDER — TECHNETIUM TC 99M MEDRONATE IV KIT
25.0000 | PACK | Freq: Once | INTRAVENOUS | Status: AC | PRN
  Administered 2012-07-18: 25 via INTRAVENOUS

## 2012-07-21 ENCOUNTER — Ambulatory Visit
Admission: RE | Admit: 2012-07-21 | Discharge: 2012-07-21 | Disposition: A | Discharge status: Home / Self Care | Payer: Medicare Other | Source: Ambulatory Visit | Attending: Internal Medicine | Admitting: Internal Medicine

## 2012-07-21 ENCOUNTER — Other Ambulatory Visit: Payer: Self-pay | Admitting: Internal Medicine

## 2012-07-21 DIAGNOSIS — N889 Noninflammatory disorder of cervix uteri, unspecified: Secondary | ICD-10-CM

## 2012-07-21 DIAGNOSIS — M542 Cervicalgia: Secondary | ICD-10-CM | POA: Diagnosis not present

## 2012-08-01 DIAGNOSIS — R0989 Other specified symptoms and signs involving the circulatory and respiratory systems: Secondary | ICD-10-CM | POA: Diagnosis not present

## 2012-08-01 DIAGNOSIS — R0609 Other forms of dyspnea: Secondary | ICD-10-CM | POA: Diagnosis not present

## 2012-08-01 DIAGNOSIS — I10 Essential (primary) hypertension: Secondary | ICD-10-CM | POA: Diagnosis not present

## 2012-08-01 DIAGNOSIS — I25810 Atherosclerosis of coronary artery bypass graft(s) without angina pectoris: Secondary | ICD-10-CM | POA: Diagnosis not present

## 2012-08-01 DIAGNOSIS — E669 Obesity, unspecified: Secondary | ICD-10-CM | POA: Diagnosis not present

## 2012-08-01 DIAGNOSIS — E785 Hyperlipidemia, unspecified: Secondary | ICD-10-CM | POA: Diagnosis not present

## 2012-08-01 DIAGNOSIS — I2510 Atherosclerotic heart disease of native coronary artery without angina pectoris: Secondary | ICD-10-CM | POA: Diagnosis not present

## 2012-08-17 DIAGNOSIS — Z125 Encounter for screening for malignant neoplasm of prostate: Secondary | ICD-10-CM | POA: Diagnosis not present

## 2012-08-17 DIAGNOSIS — E785 Hyperlipidemia, unspecified: Secondary | ICD-10-CM | POA: Diagnosis not present

## 2012-08-17 DIAGNOSIS — I10 Essential (primary) hypertension: Secondary | ICD-10-CM | POA: Diagnosis not present

## 2012-08-31 ENCOUNTER — Other Ambulatory Visit: Payer: Self-pay | Admitting: Internal Medicine

## 2012-08-31 DIAGNOSIS — R2689 Other abnormalities of gait and mobility: Secondary | ICD-10-CM

## 2012-08-31 DIAGNOSIS — R110 Nausea: Secondary | ICD-10-CM

## 2012-08-31 DIAGNOSIS — R42 Dizziness and giddiness: Secondary | ICD-10-CM

## 2012-08-31 DIAGNOSIS — E785 Hyperlipidemia, unspecified: Secondary | ICD-10-CM | POA: Diagnosis not present

## 2012-08-31 DIAGNOSIS — I2510 Atherosclerotic heart disease of native coronary artery without angina pectoris: Secondary | ICD-10-CM | POA: Diagnosis not present

## 2012-08-31 DIAGNOSIS — Z125 Encounter for screening for malignant neoplasm of prostate: Secondary | ICD-10-CM | POA: Diagnosis not present

## 2012-08-31 DIAGNOSIS — G4733 Obstructive sleep apnea (adult) (pediatric): Secondary | ICD-10-CM | POA: Diagnosis not present

## 2012-08-31 DIAGNOSIS — Z0000 Encounter for general adult medical examination without abnormal findings: Secondary | ICD-10-CM | POA: Diagnosis not present

## 2012-08-31 DIAGNOSIS — Z23 Encounter for immunization: Secondary | ICD-10-CM | POA: Diagnosis not present

## 2012-08-31 DIAGNOSIS — M1990 Unspecified osteoarthritis, unspecified site: Secondary | ICD-10-CM | POA: Diagnosis not present

## 2012-08-31 DIAGNOSIS — I10 Essential (primary) hypertension: Secondary | ICD-10-CM | POA: Diagnosis not present

## 2012-09-01 ENCOUNTER — Encounter: Payer: Self-pay | Admitting: Internal Medicine

## 2012-09-01 ENCOUNTER — Ambulatory Visit
Admission: RE | Admit: 2012-09-01 | Discharge: 2012-09-01 | Disposition: A | Discharge status: Home / Self Care | Payer: Medicare Other | Source: Ambulatory Visit | Attending: Internal Medicine | Admitting: Internal Medicine

## 2012-09-01 ENCOUNTER — Other Ambulatory Visit (INDEPENDENT_AMBULATORY_CARE_PROVIDER_SITE_OTHER): Payer: Medicare Other | Admitting: *Deleted

## 2012-09-01 DIAGNOSIS — R42 Dizziness and giddiness: Secondary | ICD-10-CM | POA: Diagnosis not present

## 2012-09-01 DIAGNOSIS — R110 Nausea: Secondary | ICD-10-CM

## 2012-09-01 DIAGNOSIS — R2689 Other abnormalities of gait and mobility: Secondary | ICD-10-CM

## 2012-09-02 DIAGNOSIS — R51 Headache: Secondary | ICD-10-CM | POA: Diagnosis not present

## 2012-09-02 DIAGNOSIS — R42 Dizziness and giddiness: Secondary | ICD-10-CM | POA: Diagnosis not present

## 2012-09-02 DIAGNOSIS — R110 Nausea: Secondary | ICD-10-CM | POA: Diagnosis not present

## 2012-09-02 MED ORDER — GADOBENATE DIMEGLUMINE 529 MG/ML IV SOLN
20.0000 mL | Freq: Once | INTRAVENOUS | Status: AC | PRN
  Administered 2012-09-02: 20 mL via INTRAVENOUS

## 2012-09-08 ENCOUNTER — Encounter: Payer: Self-pay | Admitting: Diagnostic Neuroimaging

## 2012-09-08 ENCOUNTER — Ambulatory Visit (INDEPENDENT_AMBULATORY_CARE_PROVIDER_SITE_OTHER): Payer: Medicare Other | Admitting: Diagnostic Neuroimaging

## 2012-09-08 VITALS — BP 129/69 | HR 58 | Temp 98.2°F | Ht 72.0 in | Wt 261.0 lb

## 2012-09-08 DIAGNOSIS — R209 Unspecified disturbances of skin sensation: Secondary | ICD-10-CM

## 2012-09-08 DIAGNOSIS — R42 Dizziness and giddiness: Secondary | ICD-10-CM | POA: Diagnosis not present

## 2012-09-08 DIAGNOSIS — R200 Anesthesia of skin: Secondary | ICD-10-CM | POA: Insufficient documentation

## 2012-09-08 DIAGNOSIS — M542 Cervicalgia: Secondary | ICD-10-CM | POA: Diagnosis not present

## 2012-09-08 DIAGNOSIS — Z79899 Other long term (current) drug therapy: Secondary | ICD-10-CM | POA: Diagnosis not present

## 2012-09-08 NOTE — Progress Notes (Signed)
[facility]  PATIENT: Vincent Sharp DOB: [birth year]  REFERRING CLINICIAN: Jacky Kindle HISTORY FROM: patient, wife, daughter REASON FOR VISIT: new consult   HISTORICAL  CHIEF COMPLAINT:  Chief Complaint  Patient presents with  . Dizziness    HISTORY OF PRESENT ILLNESS:   79 year old right-handed male here for evaluation of neck pain and dizziness. Has significant medical history including hypertension, hyperkalemia, coronary artery disease, skin cancer, colon cancer.  For past 5 years patient complains of gradual onset progressive neck pain. He has pain and grinding sensation in his neck when he turns from side to side. Symptoms have progressed or time. Sometimes he has pain in the back of his neck radiating to his left shoulder and left arm. Sometimes his bilateral hands go numb when he drives. Patient has had progressive balance and gait difficulty as well.  Since summer 2013 patient has had increasing problems with dizziness episodes and falling. He describes his dizziness as a combination of lightheadedness, spinning sensation, balance problems. Symptoms typically affects him when he moves his head from side to side, sits up or stands up. He has wife have never checked blood pressures during severe dizzy spells. Sometimes he has nausea and sweating with the episodes.  REVIEW OF SYSTEMS: Full 14 system review of systems performed and notable only for fatigue swelling in legs spinning sensation rash induration problems impotence consistently running nose depression anxiety joint pain swelling cramps aching muscles feeling hot headache numbness weakness dizziness sleeping snoring.  ALLERGIES: No Known Allergies  HOME MEDICATIONS: No outpatient prescriptions prior to visit.   No facility-administered medications prior to visit.    PAST MEDICAL HISTORY: Past Medical History  Diagnosis Date  . Hypertension   . Hyperlipidemia   . Heart disease   .  Depression   . Cancer     skin, colon    PAST SURGICAL HISTORY: Past Surgical History  Procedure Laterality Date  . Polypectomy  1989  . Gastric bypass  1992  . Testicle surgery  2005  . Cystectomy  1960    spine  . Cataract extraction Bilateral     FAMILY HISTORY: Family History  Problem Relation Age of Onset  . Heart disease Mother     SOCIAL HISTORY:  History   Social History  . Marital Status: Married    Spouse Name: [name]    Number of Children: 3  . Years of Education: HS   Occupational History  .      N/A   Social History Main Topics  . Smoking status: Never Smoker   . Smokeless tobacco: Never Used  . Alcohol Use: No  . Drug Use: No  . Sexually Active: Not on file   Other Topics Concern  . Not on file   Social History Narrative   Patient lives at home with his spouse.   Married 55 yrs   Caffeine Use: 1/2 cup daily.     PHYSICAL EXAM  Filed Vitals:   09/08/12 1021 09/08/12 1038  BP: 135/73 129/69  Pulse: 64 58  Temp: 98.2 F (36.8 C)   TempSrc: Oral   Height: 6' (1.829 m)   Weight: 261 lb (118.389 kg)     Not recorded    Body mass index is 35.39 kg/(m^2).  GENERAL EXAM: Patient is in no distress; DECR ROM IN NECK. DIX HALLPIKE TRIGGERS VERTIGO BUT NO NYSTAGMUS.  CARDIOVASCULAR: Regular rate and rhythm, no murmurs, no carotid bruits  NEUROLOGIC: MENTAL STATUS: awake, alert, language fluent, comprehension  intact, naming intact; NEG MYERSONS. NEG SNOUT. CRANIAL NERVE: no papilledema on fundoscopic exam, EXOTROPIA, WITH LEFT EYE AMBLYOPIA. pupils equal and reactive to light, visual fields full to confrontation, extraocular muscles intact, no nystagmus, facial sensation and strength symmetric, uvula midline, shoulder shrug symmetric, tongue midline. MOTOR: normal bulk and tone, full strength in the BUE, BLE; MILD POSTURAL TREMOR. NO BRADYKINESIA  SENSORY: ABSENT VIB AT TOES; ABSENT VIB IN LEFT ANKLE. DECR PP IN  FEET/ANKLES. COORDINATION: finger-nose-finger, fine finger movements normal REFLEXES: BUE (BICEPS 2, TRICEPS 3), KNEES 2, ANKLES 1.  GAIT/STATION: UNSTEADY GAIT. SHOT STEPS. SLOW TURNS.   DIAGNOSTIC DATA (LABS, IMAGING, TESTING) - I reviewed patient records, labs, notes, testing and imaging myself where available.  Lab Results  Component Value Date   WBC 7.0 09/10/2010   HGB 14.3 09/23/2010   HCT 42.0 09/23/2010   MCV 88.7 09/10/2010   PLT 85* 09/10/2010      Component Value Date/Time   NA 142 09/23/2010 1229   K 4.0 09/23/2010 1229   CL 105 09/10/2010 1014   CO2 29 09/10/2010 1014   GLUCOSE 89 09/23/2010 1229   BUN 25* 09/10/2010 1014   CREATININE 1.00 09/10/2010 1014   CALCIUM 9.1 09/10/2010 1014   GFRNONAA >60 09/10/2010 1014   GFRAA >60 09/10/2010 1014   No results found for this basename: CHOL, HDL, LDLCALC, LDLDIRECT, TRIG, CHOLHDL   No results found for this basename: HGBA1C   No results found for this basename: VITAMINB12   No results found for this basename: TSH    09/02/12 MRI brain - Mild age related atrophy without hydrocephalus. No acute infarct. No intracranial mass or abnormal enhancement. Very mild small vessel disease type changes.   09/01/12 CT head - < 40% stenosis of bilateral ICA   ASSESSMENT AND PLAN  79 y.o. year old male  has a past medical history of Hypertension; Hyperlipidemia; Heart disease; Depression; and Cancer. here with 5 years of neck pain and 1 year of dizziness.  Ddx neck pain: cervical spine dz, degenerative, radiculopathy, spinal stenosis   Ddx dizziness: presyncope, BPV, labyrinthitis, cardiac, metabolic  PLAN: 1. addl testing 2. PT eval for balance 3. Patient should not drive due to presyncope attacks, sleep attacks, deconditioning, poor coordination; family and patient agree with plan  Orders Placed This Encounter  Procedures  . MR Cervical Spine Wo Contrast  . Vitamin B12  . Hemoglobin A1c  . Ambulatory referral to Physical Therapy      Suanne Marker, MD 09/08/2012, 11:52 AM Certified in Neurology, Neurophysiology and Neuroimaging  [facility] [address] [place], [place] [address] [phone]

## 2012-09-09 LAB — HEMOGLOBIN A1C
Est. average glucose Bld gHb Est-mCnc: 111 mg/dL
Hgb A1c MFr Bld: 5.5 % (ref 4.8–5.6)

## 2012-09-15 ENCOUNTER — Ambulatory Visit
Admission: RE | Admit: 2012-09-15 | Discharge: 2012-09-15 | Disposition: A | Discharge status: Home / Self Care | Payer: Medicare Other | Source: Ambulatory Visit | Attending: Diagnostic Neuroimaging | Admitting: Diagnostic Neuroimaging

## 2012-09-15 DIAGNOSIS — R200 Anesthesia of skin: Secondary | ICD-10-CM

## 2012-09-15 DIAGNOSIS — R42 Dizziness and giddiness: Secondary | ICD-10-CM

## 2012-09-15 DIAGNOSIS — M542 Cervicalgia: Secondary | ICD-10-CM

## 2012-09-15 DIAGNOSIS — R209 Unspecified disturbances of skin sensation: Secondary | ICD-10-CM | POA: Diagnosis not present

## 2012-09-18 ENCOUNTER — Telehealth: Payer: Self-pay | Admitting: Diagnostic Neuroimaging

## 2012-09-18 NOTE — Telephone Encounter (Signed)
I called patient's wife. Reviewed MRI cervical results. Multi-level spinal stenosis and foraminal stenosis. May contribute to neck pain and possibly some balance issues. Could consider neurosurgery evaluation, but they want to think about it, as he is not sure about pursuing surgical treatment. Also could continue conservative mgmt with PT.  They will discuss and call us back.  Suanne Marker, MD 09/18/2012, 5:50 PM Certified in Neurology, Neurophysiology and Neuroimaging  [facility] [address], [address] [place], [place] [address] [phone]

## 2012-09-19 ENCOUNTER — Ambulatory Visit: Payer: Medicare Other | Admitting: Physical Therapy

## 2012-09-20 ENCOUNTER — Ambulatory Visit: Payer: Medicare Other | Admitting: Neurology

## 2012-09-26 DIAGNOSIS — H40019 Open angle with borderline findings, low risk, unspecified eye: Secondary | ICD-10-CM | POA: Diagnosis not present

## 2012-09-26 DIAGNOSIS — H04129 Dry eye syndrome of unspecified lacrimal gland: Secondary | ICD-10-CM | POA: Diagnosis not present

## 2012-10-11 DIAGNOSIS — H8110 Benign paroxysmal vertigo, unspecified ear: Secondary | ICD-10-CM | POA: Diagnosis not present

## 2012-10-11 DIAGNOSIS — R42 Dizziness and giddiness: Secondary | ICD-10-CM | POA: Diagnosis not present

## 2012-12-11 ENCOUNTER — Ambulatory Visit (INDEPENDENT_AMBULATORY_CARE_PROVIDER_SITE_OTHER): Payer: Medicare Other | Admitting: Diagnostic Neuroimaging

## 2012-12-11 ENCOUNTER — Encounter (INDEPENDENT_AMBULATORY_CARE_PROVIDER_SITE_OTHER): Payer: Self-pay

## 2012-12-11 ENCOUNTER — Encounter: Payer: Self-pay | Admitting: Diagnostic Neuroimaging

## 2012-12-11 VITALS — BP 137/63 | HR 58 | Ht 72.0 in | Wt 263.0 lb

## 2012-12-11 DIAGNOSIS — M4802 Spinal stenosis, cervical region: Secondary | ICD-10-CM | POA: Diagnosis not present

## 2012-12-11 DIAGNOSIS — M542 Cervicalgia: Secondary | ICD-10-CM

## 2012-12-11 DIAGNOSIS — R42 Dizziness and giddiness: Secondary | ICD-10-CM | POA: Diagnosis not present

## 2012-12-11 HISTORY — DX: Spinal stenosis, cervical region: M48.02

## 2012-12-11 NOTE — Patient Instructions (Signed)
Try physical therapy

## 2012-12-11 NOTE — Progress Notes (Signed)
[facility]  PATIENT: Vincent Sharp DOB: 1932-07-23  REFERRING CLINICIAN: Jacky Kindle HISTORY FROM: patient, wife, daughter REASON FOR VISIT: new consult   HISTORICAL  CHIEF COMPLAINT:  Chief Complaint  Patient presents with  . Follow-up    dizziness    HISTORY OF PRESENT ILLNESS:   UPDATE 12/11/12: Since last visit, doing about the same. 2 more dizzy spells since last visit, when he wakes up and has difficulty walking (5-30 minutes). No vertigo. BP checks with episodes unremarkable (130's/60's). Still with some numbness in hands when he is driving.  PRIOR HPI ([birth year]): 79 year old right-handed male here for evaluation of neck pain and dizziness. Has significant medical history including hypertension, hyperkalemia, coronary artery disease, skin cancer, colon cancer.  For past 5 years patient complains of gradual onset progressive neck pain. He has pain and grinding sensation in his neck when he turns from side to side. Symptoms have progressed or time. Sometimes he has pain in the back of his neck radiating to his left shoulder and left arm. Sometimes his bilateral hands go numb when he drives. Patient has had progressive balance and gait difficulty as well.  Since summer 2013 patient has had increasing problems with dizziness episodes and falling. He describes his dizziness as a combination of lightheadedness, spinning sensation, balance problems. Symptoms typically affects him when he moves his head from side to side, sits up or stands up. He has wife have never checked blood pressures during severe dizzy spells. Sometimes he has nausea and sweating with the episodes.  REVIEW OF SYSTEMS: Full 14 system review of systems performed and notable only for joint pain, dizziness, snoring.  ALLERGIES: No Known Allergies  HOME MEDICATIONS: Outpatient Prescriptions Prior to Visit  Medication Sig Dispense Refill  . aspirin 81 MG tablet Take 81 mg by mouth daily.       Marland Kitchen atorvastatin (LIPITOR) 40 MG tablet Take 40 mg by mouth daily. 1/2 tab in the a.m; 1/2 tab in evening      . Cyanocobalamin (B-12) 500 MCG TABS Take 1 tablet by mouth daily.      . nebivolol (BYSTOLIC) 10 MG tablet Take 10 mg by mouth daily.      . Omega-3 Fatty Acids (FISH OIL) 300 MG CAPS Take 2 capsules by mouth daily.      . ramipril (ALTACE) 10 MG capsule Take 10 mg by mouth daily.       No facility-administered medications prior to visit.    PAST MEDICAL HISTORY: Past Medical History  Diagnosis Date  . Hypertension   . Hyperlipidemia   . Heart disease   . Depression   . Cancer     skin, colon    PAST SURGICAL HISTORY: Past Surgical History  Procedure Laterality Date  . Polypectomy  1989  . Gastric bypass  1992  . Testicle surgery  2005  . Cystectomy  1960    spine  . Cataract extraction Bilateral     FAMILY HISTORY: Family History  Problem Relation Age of Onset  . Heart disease Mother     SOCIAL HISTORY:  History   Social History  . Marital Status: Married    Spouse Name: [name]    Number of Children: 3  . Years of Education: HS   Occupational History  .      N/A   Social History Main Topics  . Smoking status: Never Smoker   . Smokeless tobacco: Never Used  . Alcohol Use: No  . Drug Use:  No  . Sexual Activity: Not on file   Other Topics Concern  . Not on file   Social History Narrative   Patient lives at home with his spouse.   Married 55 yrs   Caffeine Use: 1/2 cup daily.     PHYSICAL EXAM  Filed Vitals:   12/11/12 0835  BP: 137/63  Pulse: 58  Height: 6' (1.829 m)  Weight: 263 lb (119.296 kg)    Not recorded    Body mass index is 35.66 kg/(m^2).  GENERAL EXAM: Patient is in no distress; DECR ROM IN NECK.  CARDIOVASCULAR: Regular rate and rhythm, no murmurs, no carotid bruits  NEUROLOGIC: MENTAL STATUS: awake, alert, language fluent, comprehension intact, naming intact; NEG MYERSONS. NEG SNOUT. CRANIAL NERVE:  EXOTROPIA, WITH LEFT EYE AMBLYOPIA. pupils equal and reactive to light, visual fields full to confrontation, extraocular muscles intact, no nystagmus, facial sensation and strength symmetric, uvula midline, shoulder shrug symmetric, tongue midline. MOTOR: normal bulk and tone, full strength in the BUE, BLE; MILD POSTURAL TREMOR. NO BRADYKINESIA  SENSORY: ABSENT VIB AT TOES; ABSENT VIB IN LEFT ANKLE. DECR PP IN FEET/ANKLES. COORDINATION: finger-nose-finger, fine finger movements normal REFLEXES: BUE 2, KNEES 2, ANKLES 1.  GAIT/STATION: UNSTEADY GAIT. SHOT STEPS. SLOW TURNS.   DIAGNOSTIC DATA (LABS, IMAGING, TESTING) - I reviewed patient records, labs, notes, testing and imaging myself where available.  Lab Results  Component Value Date   WBC 7.0 09/10/2010   HGB 14.3 09/23/2010   HCT 42.0 09/23/2010   MCV 88.7 09/10/2010   PLT 85* 09/10/2010      Component Value Date/Time   NA 142 09/23/2010 1229   K 4.0 09/23/2010 1229   CL 105 09/10/2010 1014   CO2 29 09/10/2010 1014   GLUCOSE 89 09/23/2010 1229   BUN 25* 09/10/2010 1014   CREATININE 1.00 09/10/2010 1014   CALCIUM 9.1 09/10/2010 1014   GFRNONAA >60 09/10/2010 1014   GFRAA >60 09/10/2010 1014   No results found for this basename: CHOL,  HDL,  LDLCALC,  LDLDIRECT,  TRIG,  CHOLHDL   Lab Results  Component Value Date   HGBA1C 5.5 09/08/2012   Lab Results  Component Value Date   VITAMINB12 1225* 09/08/2012   No results found for this basename: TSH    09/02/12 MRI brain - Mild age related atrophy without hydrocephalus. No acute infarct. No intracranial mass or abnormal enhancement. Very mild small vessel disease type changes.   09/01/12 CT head - < 40% stenosis of bilateral ICA  09/18/12 MRI cervical spine 1. Mild spinal stenosis from C3-4 to C5-6, and mild-moderate spinal stenosis at C6-7, due to facet hypertrophy, uncovertebral joint hypertrophy, and possible ossification of the posterior longitudinal ligament. No cord signal abnormalities.   2. Multi-level foraminal stenosis and degenerative changes from C3-4 to C6-7.   ASSESSMENT AND PLAN  79 y.o. year old male  has a past medical history of Hypertension; Hyperlipidemia; Heart disease; Depression; and Cancer. here with 5 years of neck pain and 1 year of dizziness.  Dx neck pain: cervical spine dz (degenerative, radiculopathy and spinal stenosis)  Ddx dizziness: presyncope, BPV, labyrinthitis, cardiac, metabolic  PLAN: 1. PT eval for balance/gait training 2. Discussed options for surgical evaluation of cervical spinal stenosis; patient wants to hold off and pursue conservative mgmt for now which is reasonable. If symptoms of weakness, pain, bowel/bladd dysfunction progress, then will need neurosurg referral   Orders Placed This Encounter  Procedures  . Ambulatory referral to Physical Therapy  Suanne Marker, MD 12/11/2012, 9:12 AM Certified in Neurology, Neurophysiology and Neuroimaging  [facility] [address], [address] [place], [place] [address] [phone]

## 2012-12-19 ENCOUNTER — Ambulatory Visit
Payer: Medicare Other | Attending: Diagnostic Neuroimaging | Admitting: Rehabilitative and Restorative Service Providers"

## 2012-12-19 DIAGNOSIS — R42 Dizziness and giddiness: Secondary | ICD-10-CM | POA: Insufficient documentation

## 2012-12-19 DIAGNOSIS — R269 Unspecified abnormalities of gait and mobility: Secondary | ICD-10-CM | POA: Diagnosis not present

## 2012-12-19 DIAGNOSIS — IMO0001 Reserved for inherently not codable concepts without codable children: Secondary | ICD-10-CM | POA: Diagnosis not present

## 2012-12-19 DIAGNOSIS — M542 Cervicalgia: Secondary | ICD-10-CM | POA: Diagnosis not present

## 2012-12-26 ENCOUNTER — Ambulatory Visit
Payer: Medicare Other | Attending: Diagnostic Neuroimaging | Admitting: Rehabilitative and Restorative Service Providers"

## 2012-12-26 DIAGNOSIS — R269 Unspecified abnormalities of gait and mobility: Secondary | ICD-10-CM | POA: Diagnosis not present

## 2012-12-26 DIAGNOSIS — M542 Cervicalgia: Secondary | ICD-10-CM | POA: Insufficient documentation

## 2012-12-26 DIAGNOSIS — IMO0001 Reserved for inherently not codable concepts without codable children: Secondary | ICD-10-CM | POA: Insufficient documentation

## 2012-12-26 DIAGNOSIS — R42 Dizziness and giddiness: Secondary | ICD-10-CM | POA: Diagnosis not present

## 2012-12-27 ENCOUNTER — Other Ambulatory Visit: Payer: Self-pay | Admitting: Dermatology

## 2012-12-27 DIAGNOSIS — B079 Viral wart, unspecified: Secondary | ICD-10-CM | POA: Diagnosis not present

## 2012-12-27 DIAGNOSIS — C44320 Squamous cell carcinoma of skin of unspecified parts of face: Secondary | ICD-10-CM | POA: Diagnosis not present

## 2012-12-27 DIAGNOSIS — L570 Actinic keratosis: Secondary | ICD-10-CM | POA: Diagnosis not present

## 2012-12-27 DIAGNOSIS — D485 Neoplasm of uncertain behavior of skin: Secondary | ICD-10-CM | POA: Diagnosis not present

## 2012-12-27 DIAGNOSIS — C44319 Basal cell carcinoma of skin of other parts of face: Secondary | ICD-10-CM | POA: Diagnosis not present

## 2012-12-28 ENCOUNTER — Ambulatory Visit: Payer: Medicare Other | Admitting: Rehabilitative and Restorative Service Providers"

## 2013-01-02 ENCOUNTER — Ambulatory Visit: Payer: Medicare Other | Admitting: Rehabilitative and Restorative Service Providers"

## 2013-01-03 DIAGNOSIS — H04129 Dry eye syndrome of unspecified lacrimal gland: Secondary | ICD-10-CM | POA: Diagnosis not present

## 2013-01-03 DIAGNOSIS — H02839 Dermatochalasis of unspecified eye, unspecified eyelid: Secondary | ICD-10-CM | POA: Diagnosis not present

## 2013-01-03 DIAGNOSIS — H43819 Vitreous degeneration, unspecified eye: Secondary | ICD-10-CM | POA: Diagnosis not present

## 2013-01-03 DIAGNOSIS — H40019 Open angle with borderline findings, low risk, unspecified eye: Secondary | ICD-10-CM | POA: Diagnosis not present

## 2013-01-03 DIAGNOSIS — H31019 Macula scars of posterior pole (postinflammatory) (post-traumatic), unspecified eye: Secondary | ICD-10-CM | POA: Diagnosis not present

## 2013-01-04 ENCOUNTER — Ambulatory Visit: Payer: Medicare Other | Admitting: Rehabilitative and Restorative Service Providers"

## 2013-01-05 DIAGNOSIS — M1990 Unspecified osteoarthritis, unspecified site: Secondary | ICD-10-CM | POA: Diagnosis not present

## 2013-01-05 DIAGNOSIS — G4733 Obstructive sleep apnea (adult) (pediatric): Secondary | ICD-10-CM | POA: Diagnosis not present

## 2013-01-05 DIAGNOSIS — E785 Hyperlipidemia, unspecified: Secondary | ICD-10-CM | POA: Diagnosis not present

## 2013-01-05 DIAGNOSIS — I2510 Atherosclerotic heart disease of native coronary artery without angina pectoris: Secondary | ICD-10-CM | POA: Diagnosis not present

## 2013-01-05 DIAGNOSIS — Z23 Encounter for immunization: Secondary | ICD-10-CM | POA: Diagnosis not present

## 2013-01-05 DIAGNOSIS — Z6834 Body mass index (BMI) 34.0-34.9, adult: Secondary | ICD-10-CM | POA: Diagnosis not present

## 2013-01-05 DIAGNOSIS — I10 Essential (primary) hypertension: Secondary | ICD-10-CM | POA: Diagnosis not present

## 2013-01-09 ENCOUNTER — Ambulatory Visit: Payer: Medicare Other | Admitting: Rehabilitative and Restorative Service Providers"

## 2013-01-11 ENCOUNTER — Ambulatory Visit (INDEPENDENT_AMBULATORY_CARE_PROVIDER_SITE_OTHER): Payer: Medicare Other | Admitting: Neurology

## 2013-01-11 ENCOUNTER — Ambulatory Visit: Payer: Medicare Other | Admitting: Rehabilitative and Restorative Service Providers"

## 2013-01-11 VITALS — BP 138/64 | HR 54 | Resp 18 | Ht 72.0 in | Wt 261.0 lb

## 2013-01-11 DIAGNOSIS — G4710 Hypersomnia, unspecified: Secondary | ICD-10-CM | POA: Diagnosis not present

## 2013-01-11 NOTE — Progress Notes (Signed)
[facility]  Provider:  Melvyn Novas, M D  Referring Provider: Minda Meo, MD Primary Care Physician:  Minda Meo, MD  Chief Complaint  Patient presents with  . SLEEP CONSULT    POSS. OSA    HPI:  Vincent Sharp is a 79 y.o. male  Is seen here as a referral   from Dr. Marjory Lies and Dr. Jacky Kindle for excessive daytime sleepiness.     His wife and daughters "made" Vincent Sharp seek medical advise about his hypersomnia. He feels it's a normal function of age, that he is sleepier and easier tired, but his family disagrees. He is retired from the [employer] and worked often extra or late hours. He has been snoring since his thirties, and he fell asleep in a truck once waiting for a train to pass.  His family noted him to fall asleep in the midst of a lively conversation, , at church , during a visit and dinner with friends...his wife witnessed apnoeic breathing.  Interestingly , Vincent Sharp is seemingly unaware- he picks up the conversation were he dropped of.  He reports dreaming about his former work, waking up and stating " I will call in sick today" , 18 years after his retirement.   His wife reports verbalization in his sleep, as if he carries mail.  His wife is doing most of the driving now.   Since Vincent Sharp retired he has less and less of a sleep wake routine. Sometimes the couple watches movies up to 2 AM , some night 10 PM or midnight is the bed time.  It is remarkable that the patient can't stay awake during a movie that late at night without falling asleep. The patient reports that he wakes up in the morning regardless of when he went to bed, mainly because his shoulder pain on the left side is waking him. He also has a headache in the early morning hours when waking. Seems to have a multi level -neck  and headache.  The headache seems  associated with the morning hours but doesn't necessarily wake him up-  but he wakes up with it. He drinks  a cup of coffee in AM, he drinks neither alcohol not smokes. He schedules no naps, but inadvertently falls asleep frequently - and his wife is frequently embarrassed.  He will take an Aleve after breakfast after he had some food intake , and his headaches sometimes resolve without additional medication, always  over the morning hours, by lunch he is better . He wakes up with a dry mouth and runny nose and had 3 bathroom breaks at night.   The patient wears partial dentures, takes these off at night.   I reviewed the patients medications. :  Health history : reviewed.            Dr Marjory Lies wrote this update on  12/11/12: Since last visit, doing about the same. 2 more dizzy spells since last visit, when he wakes up and has difficulty walking (5-30 minutes). No vertigo.  BP checks with episodes unremarkable (130's/60's). Still with some numbness in hands when he is driving.  PRIOR HPI (03/23/2034): 79 year old right-handed male here for evaluation of neck pain and dizziness. Has significant medical history including hypertension, hyperkalemia, coronary artery disease, skin cancer, colon cancer.  For past 5 years patient complains of gradual onset progressive neck pain. He has pain and grinding sensation in his neck when he turns from side to side. Symptoms have progressed  or time. Sometimes he has pain in the back of his neck radiating to his left shoulder and left arm. Sometimes his bilateral hands go numb when he drives. Patient has had progressive balance and gait difficulty as well.  Since summer 2013 patient has had increasing problems with dizziness episodes and falling. He describes his dizziness as a combination of lightheadedness, spinning sensation, balance problems. Symptoms typically affects him when he moves his head from side to side, sits up or stands up. He has wife have never checked blood pressures during severe dizzy spells. Sometimes he has nausea and sweating with the  episodes.  REVIEW OF SYSTEMS: Full 14 system review of systems performed and notable only for joint pain, dizziness, snoring. Epworth score 15, GDS 4 points, FSS 24 points.       History   Social History  . Marital Status: Married    Spouse Name: [name]    Number of Children: 3  . Years of Education: HS   Occupational History  . RET [employer]    N/A  .  [employer]   Social History Main Topics  . Smoking status: Never Smoker   . Smokeless tobacco: Never Used  . Alcohol Use: No  . Drug Use: No  . Sexual Activity: No   Other Topics Concern  . Not on file   Social History Narrative   Patient lives at home with his spouse.   Married 55 yrs   Caffeine Use: 1/2 cup daily.    Family History  Problem Relation Age of Onset  . Heart disease Mother     Past Medical History  Diagnosis Date  . Hypertension   . Hyperlipidemia   . Heart disease   . Depression   . Cancer     skin, colon  . Hypersomnia, persistent 01/11/2013    Loud snoring and daytime sleepy for many years, fell asleep at a red light.     Past Surgical History  Procedure Laterality Date  . Polypectomy  1989  . Gastric bypass  1992  . Testicle surgery  2005  . Cystectomy  1960    spine  . Cataract extraction Bilateral     Current Outpatient Prescriptions  Medication Sig Dispense Refill  . aspirin 81 MG tablet Take 81 mg by mouth daily.      Marland Kitchen atorvastatin (LIPITOR) 40 MG tablet Take 40 mg by mouth daily. 1/2 tab in the a.m; 1/2 tab in evening      . Cyanocobalamin (B-12) 500 MCG TABS Take 1 tablet by mouth daily.      . nebivolol (BYSTOLIC) 10 MG tablet Take 10 mg by mouth daily.      . Omega-3 Fatty Acids (FISH OIL) 300 MG CAPS Take 2 capsules by mouth daily.      . ramipril (ALTACE) 10 MG capsule Take 10 mg by mouth daily.       No current facility-administered medications for this visit.    Allergies as of 01/11/2013  . (No Known Allergies)    Vitals: BP 138/64  Pulse 54   Resp 18  Ht 6' (1.829 m)  Wt 261 lb (118.389 kg)  BMI 35.39 kg/m2 Last Weight:  Wt Readings from Last 1 Encounters:  01/11/13 261 lb (118.389 kg)   Last Height:   Ht Readings from Last 1 Encounters:  01/11/13 6' (1.829 m)    Physical exam:  General: The patient is awake, alert and appears not in acute distress. The patient is  well groomed. Head: Normocephalic, atraumatic. Neck is supple. Mallampati 4 , neck circumference: 14, but retrognathia is noted.  Cardiovascular:  Regular rate and rhythm , without  murmurs or carotid bruit, and without distended neck veins. Respiratory: Lungs are clear to auscultation. Skin:  With evidence of edema and skin rash over the right leg and ankle, since bypass grafting 1992 , triple bypass- Dr. Andrey Campanile. Trunk: BMI is  elevated and patient  has normal posture.  Neurologic exam : The patient is awake and alert, oriented to place and time.  Memory subjective described as intact. There is a normal attention span & concentration ability.    [facility]  PATIENT: Vincent Sharp DOB: Jul 23, 1932  REFERRING CLINICIAN: Jacky Kindle HISTORY FROM: patient, wife, daughter REASON FOR VISIT: new consult   HISTORICAL  CHIEF COMPLAINT:  Chief Complaint  Patient presents with  . SLEEP CONSULT    POSS. OSA    HISTORY OF PRESENT ILLNESS:   UPDATE 12/11/12: Since last visit, doing about the same. 2 more dizzy spells since last visit, when he wakes up and has difficulty walking (5-30 minutes). No vertigo. BP checks with episodes unremarkable (130's/60's). Still with some numbness in hands when he is driving.  PRIOR HPI (09/09/34): 79 year old right-handed male here for evaluation of neck pain and dizziness. Has significant medical history including hypertension, hyperkalemia, coronary artery disease, skin cancer, colon cancer.  For past 5 years patient complains of gradual onset progressive neck pain. He has pain and grinding sensation in  his neck when he turns from side to side. Symptoms have progressed or time. Sometimes he has pain in the back of his neck radiating to his left shoulder and left arm. Sometimes his bilateral hands go numb when he drives. Patient has had progressive balance and gait difficulty as well.  Since summer 2013 patient has had increasing problems with dizziness episodes and falling. He describes his dizziness as a combination of lightheadedness, spinning sensation, balance problems. Symptoms typically affects him when he moves his head from side to side, sits up or stands up. He has wife have never checked blood pressures during severe dizzy spells. Sometimes he has nausea and sweating with the episodes.  REVIEW OF SYSTEMS: Full 14 system review of systems performed and notable only for joint pain, dizziness, snoring.  ALLERGIES: No Known Allergies  HOME MEDICATIONS: Outpatient Prescriptions Prior to Visit  Medication Sig Dispense Refill  . aspirin 81 MG tablet Take 81 mg by mouth daily.      Marland Kitchen atorvastatin (LIPITOR) 40 MG tablet Take 40 mg by mouth daily. 1/2 tab in the a.m; 1/2 tab in evening      . Cyanocobalamin (B-12) 500 MCG TABS Take 1 tablet by mouth daily.      . nebivolol (BYSTOLIC) 10 MG tablet Take 10 mg by mouth daily.      . Omega-3 Fatty Acids (FISH OIL) 300 MG CAPS Take 2 capsules by mouth daily.      . ramipril (ALTACE) 10 MG capsule Take 10 mg by mouth daily.       No facility-administered medications prior to visit.    PAST MEDICAL HISTORY: Past Medical History  Diagnosis Date  . Hypertension   . Hyperlipidemia   . Heart disease   . Depression   . Cancer     skin, colon  . Hypersomnia, persistent 01/11/2013    Loud snoring and daytime sleepy for many years, fell asleep at a red light.     PAST  SURGICAL HISTORY: Past Surgical History  Procedure Laterality Date  . Polypectomy  1989  . Gastric bypass  1992  . Testicle surgery  2005  . Cystectomy  1960    spine  .  Cataract extraction Bilateral     FAMILY HISTORY: Family History  Problem Relation Age of Onset  . Heart disease Mother     SOCIAL HISTORY:  History   Social History  . Marital Status: Married    Spouse Name: [name]    Number of Children: 3  . Years of Education: HS   Occupational History  . RET [employer]    N/A  .  [employer]   Social History Main Topics  . Smoking status: Never Smoker   . Smokeless tobacco: Never Used  . Alcohol Use: No  . Drug Use: No  . Sexual Activity: No   Other Topics Concern  . Not on file   Social History Narrative   Patient lives at home with his spouse.   Married 55 yrs   Caffeine Use: 1/2 cup daily.     PHYSICAL EXAM  Filed Vitals:   01/11/13 1543  BP: 138/64  Pulse: 54  Resp: 18  Height: 6' (1.829 m)  Weight: 261 lb (118.389 kg)    Not recorded    Body mass index is 35.39 kg/(m^2).  GENERAL EXAM: Patient is in no distress; DECR ROM IN NECK.  CARDIOVASCULAR: Regular rate and rhythm, no murmurs, no carotid bruits  NEUROLOGIC: MENTAL STATUS: awake, alert, language fluent, comprehension intact, naming intact; NEG MYERSONS. NEG SNOUT. CRANIAL NERVE: EXOTROPIA, WITH LEFT EYE AMBLYOPIA. pupils equal and reactive to light, visual fields full to confrontation, extraocular muscles intact, no nystagmus, facial sensation and strength symmetric, uvula midline, shoulder shrug symmetric, tongue midline. MOTOR: normal bulk and tone, full strength in the BUE, BLE; MILD POSTURAL TREMOR. NO BRADYKINESIA  SENSORY: ABSENT VIB AT TOES; ABSENT VIB IN LEFT ANKLE. DECR PP IN FEET/ANKLES. COORDINATION: finger-nose-finger, fine finger movements normal REFLEXES: BUE 2, KNEES 2, ANKLES 1.  GAIT/STATION: UNSTEADY GAIT. SHOT STEPS. SLOW TURNS.   DIAGNOSTIC DATA (LABS, IMAGING, TESTING) - I reviewed patient records, labs, notes, testing and imaging myself where available.  Lab Results  Component Value Date   WBC 7.0 09/10/2010    HGB 14.3 09/23/2010   HCT 42.0 09/23/2010   MCV 88.7 09/10/2010   PLT 85* 09/10/2010      Component Value Date/Time   NA 142 09/23/2010 1229   K 4.0 09/23/2010 1229   CL 105 09/10/2010 1014   CO2 29 09/10/2010 1014   GLUCOSE 89 09/23/2010 1229   BUN 25* 09/10/2010 1014   CREATININE 1.00 09/10/2010 1014   CALCIUM 9.1 09/10/2010 1014   GFRNONAA >60 09/10/2010 1014   GFRAA >60 09/10/2010 1014   No results found for this basename: CHOL,  HDL,  LDLCALC,  LDLDIRECT,  TRIG,  CHOLHDL   Lab Results  Component Value Date   HGBA1C 5.5 09/08/2012   Lab Results  Component Value Date   VITAMINB12 1225* 09/08/2012   No results found for this basename: TSH    09/02/12 MRI brain - Mild age related atrophy without hydrocephalus. No acute infarct. No intracranial mass or abnormal enhancement. Very mild small vessel disease type changes.   09/01/12 CT head - < 40% stenosis of bilateral ICA  09/18/12 MRI cervical spine 1. Mild spinal stenosis from C3-4 to C5-6, and mild-moderate spinal stenosis at C6-7, due to facet hypertrophy, uncovertebral joint hypertrophy, and  possible ossification of the posterior longitudinal ligament. No cord signal abnormalities.  2. Multi-level foraminal stenosis and degenerative changes from C3-4 to C6-7.   ASSESSMENT AND PLAN  79 y.o. year old male  has a past medical history of Hypertension; Hyperlipidemia; Heart disease; Depression; Cancer; and Hypersomnia, persistent (01/11/2013). here with 5 years of neck pain and 1 year of dizziness.  Dx neck pain: cervical spine dz (degenerative, radiculopathy and spinal stenosis)  Ddx dizziness: presyncope, BPV, labyrinthitis, cardiac, metabolic  PLAN: 1. PT eval for balance/gait training 2. Discussed options for surgical evaluation of cervical spinal stenosis; patient wants to hold off and pursue conservative mgmt for now which is reasonable. If symptoms of weakness, pain, bowel/bladd dysfunction progress, then will need neurosurg  referral   No orders of the defined types were placed in this encounter.     Suanne Marker, MD 01/11/2013, 4:53 PM Certified in Neurology, Neurophysiology and Neuroimaging  [facility] [address] [place], [place] [address] [phone] Speech is fluent without   dysarthria, dysphonia or aphasia. Mood and affect are appropriate.  Cranial nerves: Marland Kitchen Visual fields for right eye by  finger perimetry are intact,  EXOTROPIA, WITH LEFT EYE AMBLYOPIA. pupils equal and reactive to light, visual fields full to confrontation, extraocular muscles intact, no nystagmus, facial sensation and strength symmetric, uvula midline, shoulder shrug symmetric, tongue midline.Hearing to finger rub intact.  Facial sensation intact to fine touch. Facial motor strength is symmetric and tongue and uvula move midline.  MOTOR: normal bulk and tone, full strength in the BUE, BLE;  Carpal tunnel syndrome.   MILD POSTURAL TREMOR. NO BRADYKINESIA  SENSORY: ABSENT VIB AT TOES; ABSENT VIB IN LEFT ANKLE. DECR PP IN FEET/ANKLES. COORDINATION: finger-nose-finger is with mild tremor.   REFLEXES: BUE 2, KNEES 2, ANKLES 1.  GAIT/STATION: UNSTEADY GAIT. SHOT STEPS. SLOW TURNS.   Assessment : Patient with EDS, reflected in Epworth of 16  Points , and high risk form BMI, Mallompatti 3-4 and from CAD history , HTN on multiple medications.  He has intolerance to statins.   Plan: PSG with parasomnia montage , split at AHI 15 and score at 4%.

## 2013-01-11 NOTE — Patient Instructions (Signed)
Sleep Apnea  Sleep apnea is a sleep disorder characterized by abnormal pauses in breathing while you sleep. When your breathing pauses, the level of oxygen in your blood decreases. This causes you to move out of deep sleep and into light sleep. As a result, your quality of sleep is poor, and the system that carries your blood throughout your body (cardiovascular system) experiences stress. If sleep apnea remains untreated, the following conditions can develop:  High blood pressure (hypertension).  Coronary artery disease.  Inability to achieve or maintain an erection (impotence).  Impairment of your thought process (cognitive dysfunction). There are three types of sleep apnea: 1. Obstructive sleep apnea Pauses in breathing during sleep because of a blocked airway. 2. Central sleep apnea Pauses in breathing during sleep because the area of the brain that controls your breathing does not send the correct signals to the muscles that control breathing. 3. Mixed sleep apnea A combination of both obstructive and central sleep apnea. RISK FACTORS The following risk factors can increase your risk of developing sleep apnea:  Being overweight.  Smoking.  Having narrow passages in your nose and throat.  Being of older age.  Being male.  Alcohol use.  Sedative and tranquilizer use.  Ethnicity. Among individuals younger than 35 years, African Americans are at increased risk of sleep apnea. SYMPTOMS   Difficulty staying asleep.  Daytime sleepiness and fatigue.  Loss of energy.  Irritability.  Loud, heavy snoring.  Morning headaches.  Trouble concentrating.  Forgetfulness.  Decreased interest in sex. DIAGNOSIS  In order to diagnose sleep apnea, your caregiver will perform a physical examination. Your caregiver may suggest that you take a home sleep test. Your caregiver may also recommend that you spend the night in a sleep lab. In the sleep lab, several monitors record  information about your heart, lungs, and brain while you sleep. Your leg and arm movements and blood oxygen level are also recorded. TREATMENT The following actions may help to resolve mild sleep apnea:  Sleeping on your side.   Using a decongestant if you have nasal congestion.   Avoiding the use of depressants, including alcohol, sedatives, and narcotics.   Losing weight and modifying your diet if you are overweight. There also are devices and treatments to help open your airway:  Oral appliances. These are custom-made mouthpieces that shift your lower jaw forward and slightly open your bite. This opens your airway.  Devices that create positive airway pressure. This positive pressure "splints" your airway open to help you breathe better during sleep. The following devices create positive airway pressure:  Continuous positive airway pressure (CPAP) device. The CPAP device creates a continuous level of air pressure with an air pump. The air is delivered to your airway through a mask while you sleep. This continuous pressure keeps your airway open.  Nasal expiratory positive airway pressure (EPAP) device. The EPAP device creates positive air pressure as you exhale. The device consists of single-use valves, which are inserted into each nostril and held in place by adhesive. The valves create very little resistance when you inhale but create much more resistance when you exhale. That increased resistance creates the positive airway pressure. This positive pressure while you exhale keeps your airway open, making it easier to breath when you inhale again.  Bilevel positive airway pressure (BPAP) device. The BPAP device is used mainly in patients with central sleep apnea. This device is similar to the CPAP device because it also uses an air pump to deliver   continuous air pressure through a mask. However, with the BPAP machine, the pressure is set at two different levels. The pressure when you  exhale is lower than the pressure when you inhale.  Surgery. Typically, surgery is only done if you cannot comply with less invasive treatments or if the less invasive treatments do not improve your condition. Surgery involves removing excess tissue in your airway to create a wider passage way. Document Released: 01/29/2002 Document Revised: 06/05/2012 Document Reviewed: 06/17/2011 ExitCare Patient Information 2014 [employer]. Polysomnography (Sleep Studies) Polysomnography (PSG) is a series of tests used for detecting (diagnosing) obstructive sleep apnea and other sleep disorders. The tests measure how some parts of your body are working while you are sleeping. The tests are extensive and expensive. They are done in a sleep lab or hospital, and vary from center to center. Your caregiver may perform other more simple sleep studies and questionnaires before doing more complete and involved testing. Testing may not be covered by insurance. Some of these tests are:  An EEG (Electroencephalogram). This tests your brain waves and stages of sleep.  An EOG (Electrooculogram). This measures the movements of your eyes. It detects periods of REM (rapid eye movement) sleep, which is your dream sleep.  An EKG (Electrocardiogram). This measures your heart rhythm.  EMG (Electromyography). This is a measurement of how the muscles are working in your upper airway and your legs while sleeping.  An oximetry measurement. It measures how much oxygen (air) you are getting while sleeping.  Breathing efforts may be measured. The same test can be interpreted (understood) differently by different caregivers and centers that study sleep.  Studies may be given an apnea/hypopnea index (AHI). This is a number which is found by counting the times of no breathing or under breathing during the night, and relating those numbers to the amount of time spent in bed. When the AHI is greater than 15, the patient is likely to  complain of daytime sleepiness. When the AHI is greater than 30, the patient is at increased risk for heart problems and must be followed more closely. Following the AHI also allows you to know how treatment is working. Simple oximetry (tracking the amount of oxygen that is taken in) can be used for screening patients who:  Do not have symptoms (problems) of OSA.  Have a normal Epworth Sleepiness Scale Score.  Have a low pre-test probability of having OSA.  Have none of the upper airway problems likely to cause apnea.  Oximetry is also used to determine if treatment is effective in patients who showed significant desaturations (not getting enough oxygen) on their home sleep study. One extra measure of safety is to perform additional studies for the person who only snores. This is because no one can predict with absolute certainty who will have OSA. Those who show significant desaturations (not getting enough oxygen) are recommended to have a more detailed sleep study. Document Released: 08/15/2002 Document Revised: 05/03/2011 Document Reviewed: 02/08/2005 ExitCare Patient Information 2014 [employer].  

## 2013-01-15 ENCOUNTER — Institutional Professional Consult (permissible substitution): Payer: Medicare Other | Admitting: Neurology

## 2013-01-16 ENCOUNTER — Ambulatory Visit: Payer: Medicare Other | Admitting: Rehabilitative and Restorative Service Providers"

## 2013-01-23 ENCOUNTER — Ambulatory Visit
Payer: Medicare Other | Attending: Diagnostic Neuroimaging | Admitting: Rehabilitative and Restorative Service Providers"

## 2013-01-23 DIAGNOSIS — IMO0001 Reserved for inherently not codable concepts without codable children: Secondary | ICD-10-CM | POA: Diagnosis not present

## 2013-01-23 DIAGNOSIS — R42 Dizziness and giddiness: Secondary | ICD-10-CM | POA: Insufficient documentation

## 2013-01-23 DIAGNOSIS — M542 Cervicalgia: Secondary | ICD-10-CM | POA: Diagnosis not present

## 2013-01-23 DIAGNOSIS — R269 Unspecified abnormalities of gait and mobility: Secondary | ICD-10-CM | POA: Insufficient documentation

## 2013-02-20 ENCOUNTER — Ambulatory Visit (INDEPENDENT_AMBULATORY_CARE_PROVIDER_SITE_OTHER): Payer: Medicare Other | Admitting: Neurology

## 2013-02-20 DIAGNOSIS — G4731 Primary central sleep apnea: Secondary | ICD-10-CM | POA: Diagnosis not present

## 2013-02-20 DIAGNOSIS — G4710 Hypersomnia, unspecified: Secondary | ICD-10-CM

## 2013-02-20 DIAGNOSIS — G4733 Obstructive sleep apnea (adult) (pediatric): Secondary | ICD-10-CM

## 2013-02-27 ENCOUNTER — Telehealth: Payer: Self-pay | Admitting: Neurology

## 2013-02-27 DIAGNOSIS — G4731 Primary central sleep apnea: Secondary | ICD-10-CM

## 2013-02-27 NOTE — Telephone Encounter (Signed)
Patient could not achieve apnea control at any setting of CPAP and some additional BiPAP settings failed as well. He seems to need additional oxygen . This means he will have to return for a full night BiPAP or adapt Sv titration and oxygen titration.  i like for him to get the mask for the PAP machine already issued , so he can try to get " warmed up' to using it, send to [facility].

## 2013-02-28 ENCOUNTER — Encounter: Payer: Self-pay | Admitting: *Deleted

## 2013-02-28 NOTE — Telephone Encounter (Signed)
Ivin Booty contacted this patient to try to bring him in tonight.  However, he reports that he and his spouse both have developed upper airway infections.  He was scheduled tentatively for 03/07/13 as a 1:1 patient.  This will allow the tech extra time and attention to do a fantastic desensitization prior to titration and also will allow for the choice of several masks.  Because he is sick and also because he had so many centrals emerge on PAP therapy, it will be best for this patient to await his titration study rather than starting on APAP or CPAP without O2 right now as recommended as a "warm-up".  When patient returns for titration, appropriate titration methods should be used to determine if he fails CPAP (this has been documented and if tech feels best, patient can start out on BiPAP initially - but pt may benefit from more time on CPAP).  Obstruction must be eliminated.  If central sleep apnea persists and 50% of events or greater are central in nature, then it is appropriate to switch to either BiPAP ST, ASV, or IVAPS -- if his issue is related to Hypoventilation, IVAPS may provide greater ventilation and oxygenation for him especially as he changes position.    O2 therapy may be added after we document that patient has been optimally titrated, AHI is less than 5/hr or as low as we can possibly get it and obstruction is not contributing to desats, and sats remain 88% or below for a cumulative total of 5 minutes.  O2 therapy must be added during titration study in order to order supplemental O2 therapy and have it covered by Medicare.

## 2013-03-07 ENCOUNTER — Ambulatory Visit (INDEPENDENT_AMBULATORY_CARE_PROVIDER_SITE_OTHER): Payer: Medicare Other | Admitting: Neurology

## 2013-03-07 DIAGNOSIS — L820 Inflamed seborrheic keratosis: Secondary | ICD-10-CM | POA: Diagnosis not present

## 2013-03-07 DIAGNOSIS — G4733 Obstructive sleep apnea (adult) (pediatric): Secondary | ICD-10-CM

## 2013-03-07 DIAGNOSIS — Z85828 Personal history of other malignant neoplasm of skin: Secondary | ICD-10-CM | POA: Diagnosis not present

## 2013-03-07 DIAGNOSIS — G4731 Primary central sleep apnea: Secondary | ICD-10-CM

## 2013-03-07 DIAGNOSIS — L570 Actinic keratosis: Secondary | ICD-10-CM | POA: Diagnosis not present

## 2013-03-15 ENCOUNTER — Telehealth: Payer: Self-pay | Admitting: *Deleted

## 2013-03-15 ENCOUNTER — Encounter: Payer: Self-pay | Admitting: *Deleted

## 2013-03-15 DIAGNOSIS — G4733 Obstructive sleep apnea (adult) (pediatric): Secondary | ICD-10-CM

## 2013-03-15 DIAGNOSIS — G4737 Central sleep apnea in conditions classified elsewhere: Secondary | ICD-10-CM

## 2013-03-15 NOTE — Telephone Encounter (Signed)
Called patient to discuss sleep study results.  Discussed findings, recommendations and follow up care.  Patient understood well and all questions were answered.   Patient will contact our office as soon as he receives his equipment in order to set an appointment for Dr. Brett Fairy by 30 days of BiPAP use.  Patient prefers to have his equipment delivered to his home if at all possible, therefore, I will send his order to [facility].  This may also be a good fit because Dr. Brett Fairy has requested another desensitization for this patient prior to his starting BiPAP.  Their therapist is quite good at this and will take good care of him.  Patient is aware that if he hasn't heard anything from [facility] in 1 week, he should call the sleep lab and he took that number down.  A copy of the sleep study interpretive report as well as a letter with info regarding contact info for the DME company, the importance of CPAP compliance, and the need to schedule the follow up appointment info will be mailed to the patient's home.

## 2013-04-04 NOTE — Telephone Encounter (Signed)
Called and made sure that orders were received and went through.  First try came back busy, had to send again and did receive a confirmation that time.  When I called, they had to go to their new faxes but did find that the orders were there.  She said they would contact us with any additional documentation they might need via fax. -sh

## 2013-04-04 NOTE — Telephone Encounter (Signed)
Patient reports they hadn't heard anything from [facility] so they called the phone number in their letter and [facility] said they never received any faxed order.  I prepared and re-faxed the order today 04/04/13 to [phone].  I also contacted [facility] to ensure this was the correct fax number to forward orders to for [facility] residents.    I will contact them in one hour to ensure the order was received.  I asked them to rush these orders if at all possible.

## 2013-05-02 DIAGNOSIS — H40019 Open angle with borderline findings, low risk, unspecified eye: Secondary | ICD-10-CM | POA: Diagnosis not present

## 2013-05-14 ENCOUNTER — Encounter: Payer: Self-pay | Admitting: Neurology

## 2013-05-15 ENCOUNTER — Encounter: Payer: Self-pay | Admitting: Neurology

## 2013-05-18 ENCOUNTER — Ambulatory Visit (INDEPENDENT_AMBULATORY_CARE_PROVIDER_SITE_OTHER): Payer: Medicare Other | Admitting: Neurology

## 2013-05-18 ENCOUNTER — Encounter: Payer: Self-pay | Admitting: Neurology

## 2013-05-18 VITALS — BP 138/75 | HR 53 | Resp 19 | Ht 72.5 in | Wt 265.0 lb

## 2013-05-18 DIAGNOSIS — G4730 Sleep apnea, unspecified: Secondary | ICD-10-CM | POA: Diagnosis not present

## 2013-05-18 DIAGNOSIS — G4739 Other sleep apnea: Secondary | ICD-10-CM

## 2013-05-18 DIAGNOSIS — G4731 Primary central sleep apnea: Secondary | ICD-10-CM

## 2013-05-18 DIAGNOSIS — G4733 Obstructive sleep apnea (adult) (pediatric): Secondary | ICD-10-CM | POA: Insufficient documentation

## 2013-05-18 DIAGNOSIS — Z9989 Dependence on other enabling machines and devices: Principal | ICD-10-CM

## 2013-05-18 HISTORY — DX: Other sleep apnea: G47.39

## 2013-05-18 HISTORY — DX: Primary central sleep apnea: G47.31

## 2013-05-18 NOTE — Patient Instructions (Signed)
Continue using the FFM or chin strap. Your apnea is much improved.

## 2013-05-18 NOTE — Progress Notes (Addendum)
[facility]  Provider:  Larey Seat, M D  Referring Provider: [name], MD Primary Care Physician:  [name], MD  Chief Complaint  Patient presents with  . Follow-up    Room 10  . Sleep Apnea    HPI:  SIDDIQ KALUZNY is a 80 y.o. male  Is seen here as a referral   from Dr. Leta Baptist and Dr. Reynaldo Minium for excessive daytime sleepiness.     He underwent a sleep study on 02-20-13 and was diagnosed with severe complex apnea , at an [facility] of 62, was titrated to a adapt SV, expiratory pressure of 12 cm water  and 15 cm water inspiratory pressure/ In the meanwhile his wife has been more sick , admited to hospital with pulmonary sarcoidosis. She is a cancer survivor. He is depressed.     His wife and daughters "made" Mr. Snellgrove seek medical advise about his hypersomnia. He feels it's a normal function of age, that he is sleepier and easier tired, but his family disagrees. He is retired from the [employer] and worked often extra or late hours. He has been snoring since his thirties, and he fell asleep in a truck once waiting for a train to pass.  His family noted him to fall asleep in the midst of a lively conversation, , at church , during a visit and dinner with friends...his wife witnessed apnoeic breathing.  Interestingly , Mr.Laszlo is seemingly unaware- he picks up the conversation were he dropped of.  He reports dreaming about his former work, waking up and stating " I will call in sick today" , 18 years after his retirement.   His wife reports verbalization in his sleep, as if he carries mail.  His wife is doing most of the driving now.   Since Mr. Antuna retired he has less and less of a sleep wake routine. Sometimes the couple watches movies up to 2 AM , some night 10 PM or midnight is the bed time.  It is remarkable that the patient can't stay awake during a movie that late at night without falling asleep. The patient reports that he  wakes up in the morning regardless of when he went to bed, mainly because his shoulder pain on the left side is waking him. He also has a headache in the early morning hours when waking. Seems to have a multi level -neck  and headache.  The headache seems  associated with the morning hours but doesn't necessarily wake him up-  but he wakes up with it. He drinks a cup of coffee in AM, he drinks neither alcohol not smokes. He schedules no naps, but inadvertently falls asleep frequently - and his wife is frequently embarrassed.  He will take an Aleve after breakfast after he had some food intake , and his headaches sometimes resolve without additional medication, always  over the morning hours, by lunch he is better . He wakes up with a dry mouth and runny nose and had 3 bathroom breaks at night.   The patient wears partial dentures, takes these off at night.   I reviewed the patients medications. :  Health history : reviewed.            Dr Leta Baptist wrote this update on  12/11/12: Since last visit, doing about the same. 2 more dizzy spells since last visit, when he wakes up and has difficulty walking (5-30 minutes). No vertigo.  BP checks with episodes unremarkable (130's/60's). Still  with some numbness in hands when he is driving.  PRIOR HPI (2034-07-11): 80 year old right-handed male here for evaluation of neck pain and dizziness. Has significant medical history including hypertension, hyperkalemia, coronary artery disease, skin cancer, colon cancer.  For past 5 years patient complains of gradual onset progressive neck pain. He has pain and grinding sensation in his neck when he turns from side to side. Symptoms have progressed or time. Sometimes he has pain in the back of his neck radiating to his left shoulder and left arm. Sometimes his bilateral hands go numb when he drives. Patient has had progressive balance and gait difficulty as well.  Since summer 2013 patient has had increasing  problems with dizziness episodes and falling. He describes his dizziness as a combination of lightheadedness, spinning sensation, balance problems. Symptoms typically affects him when he moves his head from side to side, sits up or stands up. He has wife have never checked blood pressures during severe dizzy spells. Sometimes he has nausea and sweating with the episodes.  REVIEW OF SYSTEMS: Full 14 system review of systems performed and notable only for joint pain, dizziness, snoring. Epworth score 15, GDS 4 points, FSS 24 points.       History   Social History  . Marital Status: Married    Spouse Name: [name]    Number of Children: 3  . Years of Education: HS   Occupational History  . RET [employer]    N/A  .  [place] History Main Topics  . Smoking status: Never Smoker   . Smokeless tobacco: Never Used  . Alcohol Use: No  . Drug Use: No  . Sexual Activity: No   Other Topics Concern  . Not on file   Social History Narrative   Patient lives at home with his spouseWilburn Cornelia)   Patient has three children.   Married 55 yrs   Caffeine Use: 1/2 cup daily.   Patient is retired.   Patient is right-handed.    Family History  Problem Relation Age of Onset  . Heart disease Mother     Past Medical History  Diagnosis Date  . Hypertension   . Hyperlipidemia   . Heart disease   . Depression   . Cancer     skin, colon  . Hypersomnia, persistent 01/11/2013    Loud snoring and daytime sleepy for many years, fell asleep at a red light.   . OSA on CPAP   . Obesity hypoventilation syndrome   . Complex sleep apnea syndrome 05/18/2013    Past Surgical History  Procedure Laterality Date  . Polypectomy  1989  . Gastric bypass  1992  . Testicle surgery  2005  . Cystectomy  1960    spine  . Cataract extraction Bilateral     Current Outpatient Prescriptions  Medication Sig Dispense Refill  . aspirin 81 MG tablet Take 81 mg by mouth daily.      Marland Kitchen  atorvastatin (LIPITOR) 40 MG tablet Take 40 mg by mouth daily. 1/2 tab in the a.m; 1/2 tab in evening      . Cyanocobalamin (B-12) 500 MCG TABS Take 1 tablet by mouth daily.      . nebivolol (BYSTOLIC) 10 MG tablet Take 10 mg by mouth daily.      . Omega-3 Fatty Acids (FISH OIL) 300 MG CAPS Take 2 capsules by mouth daily.      . ramipril (ALTACE) 10 MG capsule Take 10 mg by mouth  daily.       No current facility-administered medications for this visit.    Allergies as of 05/18/2013  . (No Known Allergies)    Vitals: BP 138/75  Pulse 53  Resp 19  Ht 6' 0.5" (1.842 m)  Wt 265 lb (120.203 kg)  BMI 35.43 kg/m2 Last Weight:  Wt Readings from Last 1 Encounters:  05/18/13 265 lb (120.203 kg)   Last Height:   Ht Readings from Last 1 Encounters:  05/18/13 6' 0.5" (1.842 m)    Physical exam:  General: The patient is awake, alert and appears not in acute distress. The patient is well groomed. Head: Normocephalic, atraumatic. Neck is supple. Mallampati 4 , neck circumference: 14, but retrognathia is noted.  Cardiovascular:  Regular rate and rhythm , without  murmurs or carotid bruit, and without distended neck veins. Respiratory: Lungs are clear to auscultation. Skin:  With evidence of edema and skin rash over the right leg and ankle, since bypass grafting 1992 , triple bypass- Dr. Redmond Pulling. Trunk: BMI is  elevated and patient  has normal posture.  Neurologic exam : The patient is awake and alert, oriented to place and time.  Memory subjective described as intact. There is a normal attention span & concentration ability.    [facility]  PATIENT: DAEL HOWLAND DOB: 01/27/1933  REFERRING CLINICIAN: Reynaldo Minium HISTORY FROM: patient, wife, daughter REASON FOR VISIT: new consult   HISTORICAL  CHIEF COMPLAINT:  Chief Complaint  Patient presents with  . Follow-up    Room 10  . Sleep Apnea    HISTORY OF PRESENT ILLNESS:   UPDATE 12/11/12: Since last visit,  doing about the same. 2 more dizzy spells since last visit, when he wakes up and has difficulty walking (5-30 minutes). No vertigo. BP checks with episodes unremarkable (130's/60's). Still with some numbness in hands when he is driving.  PRIOR HPI (October 16, 2034): 80 year old right-handed male here for evaluation of neck pain and dizziness. Has significant medical history including hypertension, hyperkalemia, coronary artery disease, skin cancer, colon cancer.  For past 5 years patient complains of gradual onset progressive neck pain. He has pain and grinding sensation in his neck when he turns from side to side. Symptoms have progressed or time. Sometimes he has pain in the back of his neck radiating to his left shoulder and left arm. Sometimes his bilateral hands go numb when he drives. Patient has had progressive balance and gait difficulty as well.  Since summer 2013 patient has had increasing problems with dizziness episodes and falling. He describes his dizziness as a combination of lightheadedness, spinning sensation, balance problems. Symptoms typically affects him when he moves his head from side to side, sits up or stands up. He has wife have never checked blood pressures during severe dizzy spells. Sometimes he has nausea and sweating with the episodes.  REVIEW OF SYSTEMS: Full 14 system review of systems performed and notable only for joint pain, dizziness, snoring.  ALLERGIES: No Known Allergies    PHYSICAL EXAM  Filed Vitals:   05/18/13 1413  BP: 138/75  Pulse: 53  Resp: 19  Height: 6' 0.5" (1.842 m)  Weight: 265 lb (120.203 kg)    Not recorded    Body mass index is 35.43 kg/(m^2).  GENERAL EXAM: Patient is in no distress; DECR ROM IN NECK.  CARDIOVASCULAR: Regular rate and rhythm, no murmurs, no carotid bruits  NEUROLOGIC: MENTAL STATUS: awake, alert, language fluent, comprehension intact, naming intact; NEG MYERSONS. NEG SNOUT. CRANIAL NERVE:  EXOTROPIA, WITH LEFT EYE  AMBLYOPIA. pupils equal and reactive to light, visual fields full to confrontation, extraocular muscles intact, no nystagmus, facial sensation and strength symmetric, uvula midline, shoulder shrug symmetric, tongue midline. MOTOR: normal bulk and tone, full strength in the BUE, BLE; MILD POSTURAL TREMOR. NO BRADYKINESIA  SENSORY: ABSENT VIB AT TOES; ABSENT VIB IN LEFT ANKLE. DECR PP IN FEET/ANKLES. COORDINATION: finger-nose-finger, fine finger movements normal REFLEXES: BUE 2, KNEES 2, ANKLES 1.  GAIT/STATION: UNSTEADY GAIT. SHOT STEPS. SLOW TURNS.   DIAGNOSTIC DATA (LABS, IMAGING, TESTING).  See sleep study from 02-20-13  ( AHI 62) and today's download.   ASSESSMENT AND PLAN  80 y.o. year old male  has a past medical history of Hypertension; Hyperlipidemia; Heart disease; Depression; Cancer; Hypersomnia, persistent (01/11/2013); OSA on CPAP; Obesity hypoventilation syndrome; and Complex sleep apnea syndrome (05/18/2013). here with 5 years of neck pain and 1 year of dizziness.  Dx : complex apnea, followed by Dr. Brett Fairy . This patient's  AHi on 90 days is  is much reduced to 7 AHI, and  5:41 hours of use.  86% .  Expiratory pressure of  11 and inspiratory pressure of 15 cm water.   FSS was 21  And EDS  at 12 , GDS at 6 points.   Patient feels depressed.    Dx neck spinal stenosis and gait disorder , followed by Dr. Leta Baptist  .      Larey Seat, MD   5/64/3329, 5:18 PM Certified in Neurology, Neurophysiology and Sleep medicine.  [facility] [address] [place], [place] [address] [phone] Speech is fluent without   dysarthria, dysphonia or aphasia. Mood and affect are appropriate.  Cranial nerves: Marland Kitchen Visual fields for right eye by  finger perimetry are intact,  EXOTROPIA, WITH LEFT EYE AMBLYOPIA. pupils equal and reactive to light, visual fields full to confrontation, extraocular muscles intact, no nystagmus, facial sensation and strength  symmetric, uvula midline, shoulder shrug symmetric, tongue midline. Hearing to finger rub intact.  Facial sensation intact to fine touch.  Facial motor strength is symmetric and tongue and uvula move midline.  MOTOR: normal bulk and tone, full strength in the BUE, BLE;  Carpal tunnel syndrome.   MILD POSTURAL TREMOR. NO BRADYKINESIA    Assessment : Patient with EDS, reflected in Epworth of 16  Points , diagnosed with complex sleep apnea and compliant with ASV PAP.  Sleep study results discussed , diagnosis and treatment options discussed .    Risk factors discussed : high risk  BMI, Mallompatti 3-4 and due to CAD history , HTN on multiple medications. He has intolerance to statins.   Plan: the patient has used ASV  5  Hours and  41 minutes each night, compliance of  86% .  Discussed with patient , who is compliant but feels symptoms did not change.   He admits he is not happy with having to use the machine. He feels not more alert or less fatigued with or without machine use , he stated. He takes naps and feels nothing of concern about it.

## 2013-05-22 ENCOUNTER — Encounter: Payer: Self-pay | Admitting: Neurology

## 2013-07-18 ENCOUNTER — Encounter: Payer: Self-pay | Admitting: Neurology

## 2013-07-26 ENCOUNTER — Encounter: Payer: Self-pay | Admitting: Neurology

## 2013-08-02 ENCOUNTER — Telehealth: Payer: Self-pay | Admitting: Neurology

## 2013-08-02 NOTE — Telephone Encounter (Signed)
Lets make a RV with him last of the day so we have ample time.

## 2013-08-02 NOTE — Telephone Encounter (Signed)
Received communication from [facility] that the patient no longer wanted to continue CPAP therapy.  His dl also notes an elevated AHI.  Called the patient to find out what obstacles he has been encountering with CPAP therapy.  Pt has many complaints.  Primarily, he is having difficulty keeping the mask on he says because he is rolling around a lot while sleep.  His wife cannot sleep with him because of the noise when air leaks out of the mask.  She has to sleep in a different room.  He says that the air pressure wakes him up in the middle of the night as if there is too much air.  His nose runs from the time he stops using it all the way until lunch time.  He suffers from vertigo, he feels that it has become worse with cpap use.  He dreads having to use it so much that he doesn't want to go to bed.  Often times not going to bed until after midnight.  He wants to use the therapy if he could find some way to become comfortable with it.  Lastly, his wife is suffering from cancer and she is not in the best of health right now.  He is worried about her and he feels so guilty that she is suffering so much, struggling to live, yet his complaint about his inability to use the machine consistently makes him ashamed.  He wants this to work for her.  After long conversation with the patient, I advised him that we would do everything possible to help him.  Advised him that I would send this information to he sleep lab manager so that she can call him with appropriate recommendations and suggestions.

## 2013-08-03 NOTE — Telephone Encounter (Signed)
RV with Vincent Sharp or me .

## 2013-08-06 ENCOUNTER — Institutional Professional Consult (permissible substitution): Payer: Medicare Other | Admitting: Pulmonary Disease

## 2013-08-06 DIAGNOSIS — I10 Essential (primary) hypertension: Secondary | ICD-10-CM | POA: Diagnosis not present

## 2013-08-06 DIAGNOSIS — E785 Hyperlipidemia, unspecified: Secondary | ICD-10-CM | POA: Diagnosis not present

## 2013-08-06 DIAGNOSIS — I2510 Atherosclerotic heart disease of native coronary artery without angina pectoris: Secondary | ICD-10-CM | POA: Diagnosis not present

## 2013-08-06 DIAGNOSIS — R0989 Other specified symptoms and signs involving the circulatory and respiratory systems: Secondary | ICD-10-CM | POA: Diagnosis not present

## 2013-08-06 DIAGNOSIS — I25810 Atherosclerosis of coronary artery bypass graft(s) without angina pectoris: Secondary | ICD-10-CM | POA: Diagnosis not present

## 2013-08-06 DIAGNOSIS — E669 Obesity, unspecified: Secondary | ICD-10-CM | POA: Diagnosis not present

## 2013-08-06 DIAGNOSIS — R0609 Other forms of dyspnea: Secondary | ICD-10-CM | POA: Diagnosis not present

## 2013-08-09 NOTE — Telephone Encounter (Signed)
LM for patient on voicemail.  Asked him to call sleep lab in order to schedule RV with Dr. Brett Fairy.  Explained she would like to assist him with some of the problems he has had with CPAP to see if we can help make things better for him.  Stated she would prefer to see him as the last patient of the day to ensure there is plenty of time for discussion.  Asked him to call us to schedule this appointment.

## 2013-08-10 NOTE — Telephone Encounter (Signed)
Spouse called back, we looked for last appt of the day, but there are no open slots for some time.  Went ahead and scheduled patient at 9:00 AM on 6/22.  He is aware to bring his machine to appointment. -sh

## 2013-08-13 ENCOUNTER — Encounter: Payer: Self-pay | Admitting: Neurology

## 2013-08-13 ENCOUNTER — Ambulatory Visit (INDEPENDENT_AMBULATORY_CARE_PROVIDER_SITE_OTHER): Payer: Medicare Other | Admitting: Neurology

## 2013-08-13 VITALS — BP 139/74 | HR 58 | Resp 18 | Ht 72.5 in | Wt 257.0 lb

## 2013-08-13 DIAGNOSIS — G4737 Central sleep apnea in conditions classified elsewhere: Secondary | ICD-10-CM | POA: Diagnosis not present

## 2013-08-13 DIAGNOSIS — R001 Bradycardia, unspecified: Secondary | ICD-10-CM

## 2013-08-13 DIAGNOSIS — G4739 Other sleep apnea: Secondary | ICD-10-CM

## 2013-08-13 DIAGNOSIS — I498 Other specified cardiac arrhythmias: Secondary | ICD-10-CM

## 2013-08-13 DIAGNOSIS — G4733 Obstructive sleep apnea (adult) (pediatric): Secondary | ICD-10-CM | POA: Diagnosis not present

## 2013-08-13 DIAGNOSIS — G4731 Primary central sleep apnea: Secondary | ICD-10-CM

## 2013-08-13 NOTE — Progress Notes (Signed)
[facility]  Provider:  Larey Seat, M D  Referring Provider: [name], MD Primary Care Physician:  Vincent Lyons, MD  Chief Complaint  Patient presents with  . Follow-up    Room 11  . Sleep Apnea    HPI:  Vincent Sharp is a 80 y.o. male  Is seen here as a referral  from Dr. Leta Sharp and Dr. Reynaldo Sharp for excessive daytime sleepiness.   Follow up visit; 08-13-13  Patient reports he sleeps initially on his back, as soon as he moves , he loses the air seal with all these masks. n the morning he sneezes and has a running nose after using CPAP, and he has a morning headache on CPAP. He wonders if the pressure was too high. He is willing to try a reduced pressure with 12 over 8 cm water.  He is still very frustrated but no unmotivated to try.  His wife noted that he sleeps less in daytime when using his machine at night. He stays awake in church. He is loudly snoring when not on CPAP. He has not established a better sleep routine. He was last night awake at 2 , 3 and 5 AM.  He underwent a sleep study on 02-20-13 and was diagnosed with severe complex apnea , at an [facility] of 62, was titrated to a adapt SV, expiratory pressure of 12 cm water  and 15 cm water inspiratory pressure/ In the meanwhile his wife has been more sick , admited to hospital with pulmonary sarcoidosis. She is a cancer survivor. He is depressed.    Last visit :  His wife and daughters "made" Vincent Sharp seek medical advise about his hypersomnia. He feels it's a normal function of age, that he is sleepier and easier tired, but his family disagrees. He is retired from the [employer] and worked often extra or late hours. He has been snoring since his thirties, and he fell asleep in a truck once waiting for a train to pass.  His family noted him to fall asleep in the midst of a lively conversation, , at church , during a visit and dinner with friends...his wife witnessed apnoeic breathing.   Interestingly , VincentSharp is seemingly unaware- he picks up the conversation were he dropped of.  He reports dreaming about his former work, waking up and stating " I will call in sick today" , 18 years after his retirement.   His wife reports verbalization in his sleep, as if he carries mail.  His wife is doing most of the driving now.   Since Mr. Vincent Sharp retired he has less and less of a sleep wake routine. Sometimes the couple watches movies up to 2 AM , some night 10 PM or midnight is the bed time.  It is remarkable that the patient can't stay awake during a movie that late at night without falling asleep. The patient reports that he wakes up in the morning regardless of when he went to bed, mainly because his shoulder pain on the left side is waking him. He also has a headache in the early morning hours when waking. Seems to have a multi level -neck  and headache.  The headache seems  associated with the morning hours but doesn't necessarily wake him up-  but he wakes up with it. He drinks a cup of coffee in AM, he drinks neither alcohol not smokes. He schedules no naps, but inadvertently falls asleep frequently - and his wife is  frequently embarrassed.  He will take an Aleve after breakfast after he had some food intake , and his headaches sometimes resolve without additional medication, always  over the morning hours, by lunch he is better . He wakes up with a dry mouth and runny nose and had 3 bathroom breaks at night.   The patient wears partial dentures, takes these off at night.   I reviewed the patients medications. : yes Health history : reviewed.           Dr Vincent Sharp wrote this update on  12/11/12: Since last visit, doing about the same. 2 more dizzy spells since last visit, when he wakes up and has difficulty walking (5-30 minutes). No vertigo.  BP checks with episodes unremarkable (130's/60's). Still with some numbness in hands when he is driving.  PRIOR HPI (02-20-2035):  80 year old right-handed male here for evaluation of neck pain and dizziness. Has significant medical history including hypertension, hyperkalemia, coronary artery disease, skin cancer, colon cancer.  REVIEW OF SYSTEMS: Full 14 system review of systems performed and notable only for joint pain, dizziness, snoring. Epworth score 15, GDS 4 points, FSS 24 points.       History   Social History  . Marital Status: Married    Spouse Name: Vincent Sharp    Number of Children: 3  . Years of Education: HS   Occupational History  . RET [employer]    N/A  .  [place] History Main Topics  . Smoking status: Never Smoker   . Smokeless tobacco: Never Used  . Alcohol Use: No  . Drug Use: No  . Sexual Activity: No   Other Topics Concern  . Not on file   Social History Narrative   Patient lives at home with his spouseWilburn Sharp)   Patient has three children.   Married 57 yrs   Caffeine Use: 1/2 cup daily.   Patient is retired.   Patient is right-handed.    Family History  Problem Relation Age of Onset  . Heart disease Mother     Past Medical History  Diagnosis Date  . Hypertension   . Hyperlipidemia   . Heart disease   . Depression   . Cancer     skin, colon  . Hypersomnia, persistent 01/11/2013    Loud snoring and daytime sleepy for many years, fell asleep at a red light.   . OSA on CPAP   . Obesity hypoventilation syndrome   . Complex sleep apnea syndrome 05/18/2013    Past Surgical History  Procedure Laterality Date  . Polypectomy  1989  . Gastric bypass  1992  . Testicle surgery  2005  . Cystectomy  1960    spine  . Cataract extraction Bilateral     Current Outpatient Prescriptions  Medication Sig Dispense Refill  . aspirin 81 MG tablet Take 81 mg by mouth daily.      Marland Kitchen atorvastatin (LIPITOR) 40 MG tablet Take 40 mg by mouth daily. 1/2 tab in the a.m; 1/2 tab in evening      . Coenzyme Q10 (CO Q-10) 100 MG CAPS Take 1 capsule by mouth daily.       . Cyanocobalamin (B-12) 500 MCG TABS Take 1 tablet by mouth daily.      . nebivolol (BYSTOLIC) 10 MG tablet Take 10 mg by mouth daily.      . Omega-3 Fatty Acids (FISH OIL) 300 MG CAPS Take 2 capsules by mouth daily.      Marland Kitchen  ramipril (ALTACE) 10 MG capsule Take 10 mg by mouth daily.       No current facility-administered medications for this visit.    Allergies as of 08/13/2013  . (No Known Allergies)    Vitals: BP 139/74  Pulse 58  Resp 18  Ht 6' 0.5" (1.842 m)  Wt 257 lb (116.574 kg)  BMI 34.36 kg/m2 Last Weight:  Wt Readings from Last 1 Encounters:  08/13/13 257 lb (116.574 kg)   Last Height:   Ht Readings from Last 1 Encounters:  08/13/13 6' 0.5" (1.842 m)    Physical exam:  General: The patient is awake, alert and appears not in acute distress. The patient is well groomed. Head: Normocephalic, atraumatic. Neck is supple. Mallampati 4 , neck circumference: 14, but retrognathia is noted.  Cardiovascular:  Regular rate and rhythm , without  murmurs or carotid bruit, and without distended neck veins. Respiratory: Lungs are clear to auscultation. Skin:  With evidence of edema and skin rash over the right leg and ankle, since bypass grafting 1992 , triple bypass- Dr. Redmond Pulling. Trunk: BMI is  elevated and patient  has normal posture.  Neurologic exam : The patient is awake and alert, oriented to place and time.  Memory subjective described as intact. There is a normal attention span & concentration ability.   UPDATE 12/11/12: Since last visit, doing about the same. 2 more dizzy spells since last visit, when he wakes up and has difficulty walking (5-30 minutes). No vertigo. BP checks with episodes unremarkable (130's/60's). Still with some numbness in hands when he is driving.     PHYSICAL EXAM  Filed Vitals:   08/13/13 0907  BP: 139/74  Pulse: 58  Resp: 18  Height: 6' 0.5" (1.842 m)  Weight: 257 lb (116.574 kg)    Not recorded    Body mass index is 34.36  kg/(m^2).  DIAGNOSTIC DATA (LABS, IMAGING, TESTING).  See sleep study from 02-20-13  ( AHI 62) and today's download.   ASSESSMENT AND PLAN  80 y.o. year old male  has a past medical history of Hypertension; Hyperlipidemia; Heart disease; Depression; Cancer; Hypersomnia, persistent (01/11/2013); OSA on CPAP; Obesity hypoventilation syndrome; and Complex sleep apnea syndrome (05/18/2013). here with 5 years of neck pain and 1 year of dizziness.  Dx : complex apnea, followed by  This patient's  AHi on 90 days is  is much reduced to 7. 4  AHI, and  5:41 hours of use.  86% .  Expiratory pressure of  11 and inspiratory pressure of 15 cm water.  Patient feels depressed.   I will reduce the BiPAP rpesure by 2 cm to 13 over 9 cm , flex function and the patient will try to use a wedge in bed.  This may help his wife, too.  Ordered ONO on BiPAP . Shawnee Heugly will see this patient in the sleep lab today for a education and refitting. Humidity adjustment , etc.  Her note will be an addendum to the visit note.    Dx neck spinal stenosis and gait disorder , followed by Dr. Leta Sharp .      Larey Seat, MD   5/36/6440, 3:47 AM Certified in Neurology, Neurophysiology and Sleep medicine.  [facility] [address], [place], [place] [address] [phone]

## 2013-08-13 NOTE — Patient Instructions (Signed)

## 2013-08-15 ENCOUNTER — Other Ambulatory Visit (INDEPENDENT_AMBULATORY_CARE_PROVIDER_SITE_OTHER): Payer: Medicare Other | Admitting: *Deleted

## 2013-08-15 DIAGNOSIS — Z0289 Encounter for other administrative examinations: Secondary | ICD-10-CM

## 2013-08-16 NOTE — Addendum Note (Signed)
Addended by: Jacklynn Bue B on: 08/16/2013 09:30 AM   Modules accepted: Orders

## 2013-08-16 NOTE — Progress Notes (Signed)
Met with patient on 08/15/13 with his spouse to evaluate and troubleshoot problems with BiPAP and mask issues in order to determine if he can be successful in continuing to use it.  Patient has a F&P Eson nasal mask, an F&P Simplus FFM, and a ResMed P10 med nasal pillow mask.  He prefers the nasal pillows but has trouble with it sliding off his head when he moves during the night.  The air leaks have driven his spouse out of the bedroom and this is unacceptable to both of them.  His main problem is high air pressure and difficulty keeping his mouth closed.  He c/o of very dry mouth in the morning as well as feeling overwhelmed by the air pressure during the night and prior to bedtime.  His habit when trying to use it has been waking with high pressures and struggling to adjust, then he finally takes it off for the rest of the night.  He reports he can't wait to wake up in the morning so he can get out of bed and sit in his recliner where he can take a nap and feel more rested.  The primary problems are mouth leak which is aggravated by high air pressure requirements.  After researching the data from both studies, past visit notes, downloads, etc.  He is given the following advice/education: 1.  Stay away from the [facility], this made his apnea worse in his first study.  Stick with the nasal pillows which feel most comfortable to him.  He is given a new P10 medium as his is worn out, he is also fit for a Nuance pillow and shown the straps that will help keep the mask in place potentially better  than the P10, he likes how this mask can hold the tubing out of his way above his head.  He is encouraged to try it at home without the chin strap which he is  wearing so tight it is creating tender pressure points along his face.  His filter was also changed today.  He is advised to contact [facility] for  replacement supplies as needed, they can ship to him and bill his insurance.  His wife prefers [facility] and he is  informed that a prescription for replacement supplies  can be sent to [facility] if desired, they should just let us know.  He has three insurances and should have no OOP expense for replacements. 2.  Try to breathe in through the nose and out through the nose.    Tongue placement is discussed to help him keep his mouth closed as is positional therapy (staying off his back when possible because leak was much worse  while sleeping on his back).  Pt has a shoulder injury so he rolls back and forth all night.  Humidity, temperature, climate control all discussed.   3.  Discussion of what his current pressures do and what they are responsible for, discussion of what his new pressures will do for him and how it will be different and hopefully be a solution for what he is experiencing.    There is some confusion based on the clinic notes as to what settings he has.  He is using a BiPAP in Spontaneous mode (no back up rate) and it is set  currently at 15/11 cm H2O.  Because his machine is a VPAP Auto it can be set in CPAP mode, BiPAP, Auto CPAP or Auto BiPAP mode.  We made changes to  the  settings today in office and orders have been added to this encounter to reflect these changes.  Switched him to Auto BiPAP with pressure support of 4  cm H2O, his minimum EPAP is 4 cm H2O, and maximum is 16 cm H2O.  He tries this pressure while in office today for several minutes and appears to fall  asleep in the chair, mouth is closed the entire time and he feels like he could use this.  He is advised that the pressure will increase during sleep and may  awaken him potentially, especially at first.  But he is also instructed on what to do in that event so that he can continue using it rather than taking it off for the  night.   He feels he has more control now.  He will try this for now and they are instructed that if there are problems, they should call ASAP, if things are going well  after a  week or two, we should review a download and  see how his AHI looks with these new settings.  Both [place] and his wife left feeling like these were good changes for him.  They are aware who to call and when for what so they don't feel they have to trudge through alone if any concerns crop up.

## 2013-08-27 DIAGNOSIS — I10 Essential (primary) hypertension: Secondary | ICD-10-CM | POA: Diagnosis not present

## 2013-08-27 DIAGNOSIS — Z125 Encounter for screening for malignant neoplasm of prostate: Secondary | ICD-10-CM | POA: Diagnosis not present

## 2013-08-27 DIAGNOSIS — E785 Hyperlipidemia, unspecified: Secondary | ICD-10-CM | POA: Diagnosis not present

## 2013-09-03 DIAGNOSIS — Z1331 Encounter for screening for depression: Secondary | ICD-10-CM | POA: Diagnosis not present

## 2013-09-03 DIAGNOSIS — Z0000 Encounter for general adult medical examination without abnormal findings: Secondary | ICD-10-CM | POA: Diagnosis not present

## 2013-09-03 DIAGNOSIS — Z6833 Body mass index (BMI) 33.0-33.9, adult: Secondary | ICD-10-CM | POA: Diagnosis not present

## 2013-09-03 DIAGNOSIS — I10 Essential (primary) hypertension: Secondary | ICD-10-CM | POA: Diagnosis not present

## 2013-09-03 DIAGNOSIS — I2510 Atherosclerotic heart disease of native coronary artery without angina pectoris: Secondary | ICD-10-CM | POA: Diagnosis not present

## 2013-09-03 DIAGNOSIS — M1990 Unspecified osteoarthritis, unspecified site: Secondary | ICD-10-CM | POA: Diagnosis not present

## 2013-09-03 DIAGNOSIS — G4733 Obstructive sleep apnea (adult) (pediatric): Secondary | ICD-10-CM | POA: Diagnosis not present

## 2013-09-03 DIAGNOSIS — E785 Hyperlipidemia, unspecified: Secondary | ICD-10-CM | POA: Diagnosis not present

## 2013-09-03 DIAGNOSIS — Z125 Encounter for screening for malignant neoplasm of prostate: Secondary | ICD-10-CM | POA: Diagnosis not present

## 2013-09-05 DIAGNOSIS — Z85828 Personal history of other malignant neoplasm of skin: Secondary | ICD-10-CM | POA: Diagnosis not present

## 2013-09-05 DIAGNOSIS — L570 Actinic keratosis: Secondary | ICD-10-CM | POA: Diagnosis not present

## 2013-09-06 DIAGNOSIS — M25819 Other specified joint disorders, unspecified shoulder: Secondary | ICD-10-CM | POA: Diagnosis not present

## 2013-09-12 ENCOUNTER — Ambulatory Visit (INDEPENDENT_AMBULATORY_CARE_PROVIDER_SITE_OTHER): Payer: Medicare Other | Admitting: Adult Health

## 2013-09-12 ENCOUNTER — Encounter: Payer: Self-pay | Admitting: Adult Health

## 2013-09-12 VITALS — BP 124/71 | HR 62 | Ht 72.5 in | Wt 250.0 lb

## 2013-09-12 DIAGNOSIS — G4733 Obstructive sleep apnea (adult) (pediatric): Secondary | ICD-10-CM | POA: Diagnosis not present

## 2013-09-12 DIAGNOSIS — G4710 Hypersomnia, unspecified: Secondary | ICD-10-CM

## 2013-09-12 DIAGNOSIS — Z9989 Dependence on other enabling machines and devices: Principal | ICD-10-CM

## 2013-09-12 NOTE — Progress Notes (Signed)
I agree with the assessment and plan as directed by NP .The patient is known to me .   Laurenashley Viar, MD  

## 2013-09-12 NOTE — Patient Instructions (Signed)

## 2013-09-12 NOTE — Progress Notes (Signed)
PATIENT: Vincent Sharp DOB: 30-Jul-1932  REASON FOR VISIT: follow up HISTORY FROM: patient  HISTORY OF PRESENT ILLNESS: Mr. Vincent Sharp is a 80 year old male with history of obstructive sleep Apnea . He returns today for a 30 days compliance download. He brought his machine with him today and his reports shows an AHI of  8.1 on BiPAP. He  uses his machine for average of 2 hours a night. Patient did not use his machine 16 out of 30 days.  His Epworth score is 14 points was previously 15  points. His fatigue severity score is 16 was previously 24.  Patient reports that he gets about 6-7 hours of sleep a night. He goes to bed around 12 am and arises between 6-7am. He denies having trouble falling asleep or staying a sleep. Patient does have trouble falling and staying asleep with the BiPAP on. States that the gets up about 1-2 times a night to urinate. Patient has had basal cell skin cancer spots removed from his face. He states that is one reason that he did not wear the BiPAP because the mask and straps put pressure on the spots he had removed. Patient was also have shoulder pain and having steroid injections and states at night he tossed and turned trying to get comfortable so the mask would come off.    REVIEW OF SYSTEMS: Full 14 system review of systems performed and notable only for:  Constitutional: N/A  Eyes: N/A Ear/Nose/Throat: N/A  Skin: N/A  Cardiovascular: N/A  Respiratory: N/A  Gastrointestinal: N/A  Genitourinary: N/A Hematology/Lymphatic: N/A  Endocrine: N/A Musculoskeletal:N/A  Allergy/Immunology: N/A  Neurological: N/A Psychiatric: N/A Sleep: N/A   ALLERGIES: No Known Allergies  HOME MEDICATIONS: Outpatient Prescriptions Prior to Visit  Medication Sig Dispense Refill  . aspirin 81 MG tablet Take 81 mg by mouth daily.      Marland Kitchen atorvastatin (LIPITOR) 40 MG tablet Take 40 mg by mouth daily. Taking 40 mg MWF, 20 mg other days      . Coenzyme Q10 (CO Q-10) 100 MG CAPS  Take 1 capsule by mouth daily.      . Cyanocobalamin (B-12) 500 MCG TABS Take 1 tablet by mouth daily.      . nebivolol (BYSTOLIC) 10 MG tablet Take 10 mg by mouth daily.      . Omega-3 Fatty Acids (FISH OIL) 300 MG CAPS Take 2 capsules by mouth daily.      . ramipril (ALTACE) 10 MG capsule Take 10 mg by mouth daily.       No facility-administered medications prior to visit.    PAST MEDICAL HISTORY: Past Medical History  Diagnosis Date  . Hypertension   . Hyperlipidemia   . Heart disease   . Depression   . Cancer     skin, colon  . Hypersomnia, persistent 01/11/2013    Loud snoring and daytime sleepy for many years, fell asleep at a red light.   . OSA on CPAP   . Obesity hypoventilation syndrome   . Complex sleep apnea syndrome 05/18/2013    PAST SURGICAL HISTORY: Past Surgical History  Procedure Laterality Date  . Polypectomy  1989  . Gastric bypass  1992  . Testicle surgery  2005  . Cystectomy  1960    spine  . Cataract extraction Bilateral     FAMILY HISTORY: Family History  Problem Relation Age of Onset  . Heart disease Mother     SOCIAL HISTORY: History   Social  History  . Marital Status: Married    Spouse Name: Vincent Sharp    Number of Children: 3  . Years of Education: HS   Occupational History  . RET [employer]    N/A  .  [place] History Main Topics  . Smoking status: Never Smoker   . Smokeless tobacco: Never Used  . Alcohol Use: No  . Drug Use: No  . Sexual Activity: No   Other Topics Concern  . Not on file   Social History Narrative   Patient lives at home with his spouseWilburn Sharp)   Patient has three children.   Married 57 yrs   Caffeine Use: 1/2 cup daily.   Patient is retired.   Patient is right-handed.      PHYSICAL EXAM  Filed Vitals:   09/12/13 0946  BP: 124/71  Pulse: 62  Height: 6' 0.5" (1.842 m)  Weight: 250 lb (113.399 kg)   Body mass index is 33.42 kg/(m^2).  Generalized: Well developed, in no  acute distress NECK: circumference 17 inches, Mallampati 4+   Neurological examination  Mentation: Alert oriented to time, place, history taking. Follows all commands speech and language fluent Cranial nerve II-XII:  Extraocular movements were full, visual field were full on confrontational test. Motor: The motor testing reveals 5 over 5 strength of all 4 extremities. Good symmetric motor tone is noted throughout.  Sensory: Sensory testing is intact to soft touch on all 4 extremities. No evidence of extinction is noted.  Coordination: Cerebellar testing reveals good finger-nose-finger and heel-to-shin bilaterally.  Gait and station: Gait is normal. Tandem gait is normal. Romberg is negative. No drift is seen.  Reflexes: Deep tendon reflexes are symmetric and normal bilaterally.    DIAGNOSTIC DATA (LABS, IMAGING, TESTING) - I reviewed patient records, labs, notes, testing and imaging myself where available.  Lab Results  Component Value Date   WBC 7.0 09/10/2010   HGB 14.3 09/23/2010   HCT 42.0 09/23/2010   MCV 88.7 09/10/2010   PLT 85* 09/10/2010      Component Value Date/Time   NA 142 09/23/2010 1229   K 4.0 09/23/2010 1229   CL 105 09/10/2010 1014   CO2 29 09/10/2010 1014   GLUCOSE 89 09/23/2010 1229   BUN 25* 09/10/2010 1014   CREATININE 1.00 09/10/2010 1014   CALCIUM 9.1 09/10/2010 1014   GFRNONAA >60 09/10/2010 1014   GFRAA >60 09/10/2010 1014   No results found for this basename: CHOL, HDL, LDLCALC, LDLDIRECT, TRIG, CHOLHDL   Lab Results  Component Value Date   HGBA1C 5.5 09/08/2012   Lab Results  Component Value Date   VITAMINB12 1225* 09/08/2012   No results found for this basename: TSH      ASSESSMENT AND PLAN 80 y.o. year old male  has a past medical history of Hypertension; Hyperlipidemia; Heart disease; Depression; Cancer; Hypersomnia, persistent (01/11/2013); OSA on CPAP; Obesity hypoventilation syndrome; and Complex sleep apnea syndrome (05/18/2013). here with:  1. OSA  on CPAP 2. Hypersomnia  Patient was unable to use his BiPAP regularly the last 30 days due to skin cancer being removed from his face. Patient also had some shoulder pain and would toss and turn in bed and that would cause the mask to come off. I have advised the patient that he should attempt to wear the new mask we gave him at the last visit with the current setting. If he finds that he is still unable to tolerate the mask he  should call our sleep lab and have the tech look at it. Patient verbalizes understanding. Patient also having numbness in his hands that is thought be to carpal tunnel syndrome. I explained to the patient that we can do nerve conduction studies to confirm this diagnosis. However at this time the patient has deferred. I advised him to try wrist splints to see if this is beneficial. Patient should followup in 3 months and should bring his machine with him.  Ward Givens, MSN, NP-C 09/12/2013, 9:51 AM [facility] [address] [place], [place] [address] [phone]  Note: This document was prepared with digital dictation and possible smart phrase technology. Any transcriptional errors that result from this process are unintentional.

## 2013-11-07 DIAGNOSIS — K648 Other hemorrhoids: Secondary | ICD-10-CM | POA: Diagnosis not present

## 2013-11-07 DIAGNOSIS — K625 Hemorrhage of anus and rectum: Secondary | ICD-10-CM | POA: Diagnosis not present

## 2013-12-10 ENCOUNTER — Other Ambulatory Visit: Payer: Self-pay | Admitting: Urology

## 2013-12-10 ENCOUNTER — Ambulatory Visit (HOSPITAL_COMMUNITY)
Admission: RE | Admit: 2013-12-10 | Discharge: 2013-12-10 | Disposition: A | Discharge status: Home / Self Care | Payer: Medicare Other | Source: Ambulatory Visit | Attending: Urology | Admitting: Urology

## 2013-12-10 DIAGNOSIS — M25511 Pain in right shoulder: Secondary | ICD-10-CM

## 2013-12-10 DIAGNOSIS — W19XXXA Unspecified fall, initial encounter: Secondary | ICD-10-CM | POA: Diagnosis not present

## 2013-12-10 DIAGNOSIS — N433 Hydrocele, unspecified: Secondary | ICD-10-CM | POA: Diagnosis not present

## 2013-12-10 DIAGNOSIS — M25519 Pain in unspecified shoulder: Secondary | ICD-10-CM | POA: Diagnosis not present

## 2013-12-10 DIAGNOSIS — S4991XA Unspecified injury of right shoulder and upper arm, initial encounter: Secondary | ICD-10-CM | POA: Diagnosis not present

## 2013-12-11 ENCOUNTER — Ambulatory Visit: Payer: Medicare Other | Admitting: Diagnostic Neuroimaging

## 2013-12-11 ENCOUNTER — Ambulatory Visit (INDEPENDENT_AMBULATORY_CARE_PROVIDER_SITE_OTHER): Payer: Medicare Other | Admitting: Diagnostic Neuroimaging

## 2013-12-11 ENCOUNTER — Encounter: Payer: Self-pay | Admitting: Diagnostic Neuroimaging

## 2013-12-11 ENCOUNTER — Encounter (INDEPENDENT_AMBULATORY_CARE_PROVIDER_SITE_OTHER): Payer: Self-pay

## 2013-12-11 VITALS — BP 143/68 | HR 56 | Ht 72.0 in | Wt 249.2 lb

## 2013-12-11 DIAGNOSIS — R42 Dizziness and giddiness: Secondary | ICD-10-CM

## 2013-12-11 DIAGNOSIS — M4802 Spinal stenosis, cervical region: Secondary | ICD-10-CM

## 2013-12-11 NOTE — Progress Notes (Signed)
[facility]  PATIENT: Vincent Sharp DOB: Feb 02, 1933  REFERRING CLINICIAN: Reynaldo Minium HISTORY FROM: patient REASON FOR VISIT: follow up   HISTORICAL  CHIEF COMPLAINT:  Chief Complaint  Patient presents with  . Arm Pain    HISTORY OF PRESENT ILLNESS:   UPDAET 12/11/13: Since last visit, dizzy spells have resolved. Some stiffness in hands has continued, esp in early AM. Last week, was cutting tree limbs, slipped and fell on right side. Now with right shoulder pain and right hip/gluteal bruising. Also struggling with caring for his wife who is struggling with ovarian CA and sarcoidosis. Also on CPAP via Dr. Brett Fairy.  UPDATE 12/11/12: Since last visit, doing about the same. 2 more dizzy spells since last visit, when he wakes up and has difficulty walking (5-30 minutes). No vertigo. BP checks with episodes unremarkable (130's/60's). Still with some numbness in hands when he is driving.  PRIOR HPI (03/02/34): 80 year old right-handed male here for evaluation of neck pain and dizziness. Has significant medical history including hypertension, hyperkalemia, coronary artery disease, skin cancer, colon cancer. For past 5 years patient complains of gradual onset progressive neck pain. He has pain and grinding sensation in his neck when he turns from side to side. Symptoms have progressed or time. Sometimes he has pain in the back of his neck radiating to his left shoulder and left arm. Sometimes his bilateral hands go numb when he drives. Patient has had progressive balance and gait difficulty as well. Since summer 2013 patient has had increasing problems with dizziness episodes and falling. He describes his dizziness as a combination of lightheadedness, spinning sensation, balance problems. Symptoms typically affects him when he moves his head from side to side, sits up or stands up. He has wife have never checked blood pressures during severe dizzy spells. Sometimes he has  nausea and sweating with the episodes.  REVIEW OF SYSTEMS: Full 14 system review of systems performed and notable only for joint pain, dizziness, snoring.  ALLERGIES: No Known Allergies  HOME MEDICATIONS: Outpatient Prescriptions Prior to Visit  Medication Sig Dispense Refill  . aspirin 81 MG tablet Take 81 mg by mouth daily.      Marland Kitchen atorvastatin (LIPITOR) 40 MG tablet Take 40 mg by mouth daily. Taking 40 mg MWF, 20 mg other days      . Coenzyme Q10 (CO Q-10) 100 MG CAPS Take 1 capsule by mouth daily.      . Cyanocobalamin (B-12) 500 MCG TABS Take 1 tablet by mouth daily.      . nebivolol (BYSTOLIC) 10 MG tablet Take 10 mg by mouth daily.      . Omega-3 Fatty Acids (FISH OIL) 300 MG CAPS Take 2 capsules by mouth daily.      . ramipril (ALTACE) 10 MG capsule Take 10 mg by mouth daily.       No facility-administered medications prior to visit.    PAST MEDICAL HISTORY: Past Medical History  Diagnosis Date  . Hypertension   . Hyperlipidemia   . Heart disease   . Depression   . Cancer     skin, colon  . Hypersomnia, persistent 01/11/2013    Loud snoring and daytime sleepy for many years, fell asleep at a red light.   . OSA on CPAP   . Obesity hypoventilation syndrome   . Complex sleep apnea syndrome 05/18/2013    PAST SURGICAL HISTORY: Past Surgical History  Procedure Laterality Date  . Polypectomy  1989  . Gastric bypass  1992  . Testicle surgery  2005  . Cystectomy  1960    spine  . Cataract extraction Bilateral     FAMILY HISTORY: Family History  Problem Relation Age of Onset  . Heart disease Mother     SOCIAL HISTORY:  History   Social History  . Marital Status: Married    Spouse Name: [name]    Number of Children: 3  . Years of Education: HS   Occupational History  . RET [employer]    N/A  .  [place] History Main Topics  . Smoking status: Never Smoker   . Smokeless tobacco: Never Used  . Alcohol Use: No  . Drug Use: No  .  Sexual Activity: No   Other Topics Concern  . Not on file   Social History Narrative   Patient lives at home with his spouseWilburn Cornelia)   Patient has three children.   Married 57 yrs   Caffeine Use: 1/2 cup daily.   Patient is retired.   Patient is right-handed.     PHYSICAL EXAM  Filed Vitals:   12/11/13 1024  BP: 143/68  Pulse: 56  Height: 6' (1.829 m)  Weight: 249 lb 3.2 oz (113.036 kg)    Not recorded    Body mass index is 33.79 kg/(m^2).  GENERAL EXAM: Patient is in no distress; DECR ROM IN NECK.  CARDIOVASCULAR: Regular rate and rhythm, no murmurs, no carotid bruits  NEUROLOGIC: MENTAL STATUS: awake, alert, language fluent, comprehension intact, naming intact; NEG MYERSONS. NEG SNOUT. CRANIAL NERVE: EXOTROPIA, WITH LEFT EYE AMBLYOPIA. pupils equal and reactive to light, visual fields full to confrontation, extraocular muscles intact, no nystagmus, facial sensation and strength symmetric, uvula midline, shoulder shrug symmetric, tongue midline. MOTOR: normal bulk and tone, full strength in the LUE, BLE; MILD POSTURAL TREMOR. NO BRADYKINESIA; RUE LIMITED DUE TO PAIN IN RIGHT SHOULDER.  SENSORY: ABSENT VIB AT TOES; ABSENT VIB IN LEFT ANKLE. DECR PP IN FEET/ANKLES. COORDINATION: finger-nose-finger, fine finger movements normal REFLEXES: BUE 2, KNEES 2, ANKLES 1.  GAIT/STATION: UNSTEADY GAIT. SHOT STEPS. SLOW TURNS.   DIAGNOSTIC DATA (LABS, IMAGING, TESTING) - I reviewed patient records, labs, notes, testing and imaging myself where available.  Lab Results  Component Value Date   WBC 7.0 09/10/2010   HGB 14.3 09/23/2010   HCT 42.0 09/23/2010   MCV 88.7 09/10/2010   PLT 85* 09/10/2010      Component Value Date/Time   NA 142 09/23/2010 1229   K 4.0 09/23/2010 1229   CL 105 09/10/2010 1014   CO2 29 09/10/2010 1014   GLUCOSE 89 09/23/2010 1229   BUN 25* 09/10/2010 1014   CREATININE 1.00 09/10/2010 1014   CALCIUM 9.1 09/10/2010 1014   GFRNONAA >60 09/10/2010 1014   GFRAA >60  09/10/2010 1014   No results found for this basename: CHOL,  HDL,  LDLCALC,  LDLDIRECT,  TRIG,  CHOLHDL   Lab Results  Component Value Date   HGBA1C 5.5 09/08/2012   Lab Results  Component Value Date   VITAMINB12 1225* 09/08/2012   No results found for this basename: TSH    09/02/12 MRI brain - Mild age related atrophy without hydrocephalus. No acute infarct. No intracranial mass or abnormal enhancement. Very mild small vessel disease type changes.   09/01/12 CT head - < 40% stenosis of bilateral ICA  09/18/12 MRI cervical spine 1. Mild spinal stenosis from C3-4 to C5-6, and mild-moderate spinal stenosis at C6-7, due to facet  hypertrophy, uncovertebral joint hypertrophy, and possible ossification of the posterior longitudinal ligament. No cord signal abnormalities.  2. Multi-level foraminal stenosis and degenerative changes from C3-4 to C6-7.   ASSESSMENT AND PLAN  80 y.o. year old male  has a past medical history of Hypertension; Hyperlipidemia; Heart disease; Depression; Cancer; Hypersomnia, persistent (01/11/2013); OSA on CPAP; Obesity hypoventilation syndrome; and Complex sleep apnea syndrome (05/18/2013). here with 5 years of neck pain and 1 year of dizziness. Dizziness has improved spontaneously. Neck issue is stable.   Dx neck pain: cervical spine dz (degenerative, radiculopathy and spinal stenosis)  PLAN: 1. Discussed options for surgical evaluation of cervical spinal stenosis; patient wants to hold off and pursue conservative mgmt for now which is reasonable. If symptoms of weakness, pain, bowel/bladd dysfunction progress, then will need neurosurg referral 2. Follow up as needed  Return if symptoms worsen or fail to improve.    Penni Bombard, MD 44/96/7591, 63:84 AM Certified in Neurology, Neurophysiology and Neuroimaging  [facility] [address] [place], [place] [address] [phone]

## 2013-12-11 NOTE — Patient Instructions (Signed)
Follow up as needed

## 2013-12-13 ENCOUNTER — Ambulatory Visit (INDEPENDENT_AMBULATORY_CARE_PROVIDER_SITE_OTHER): Payer: Medicare Other | Admitting: Adult Health

## 2013-12-13 ENCOUNTER — Encounter: Payer: Self-pay | Admitting: Neurology

## 2013-12-13 ENCOUNTER — Encounter: Payer: Self-pay | Admitting: Adult Health

## 2013-12-13 VITALS — BP 144/70 | HR 65 | Ht 72.5 in | Wt 250.4 lb

## 2013-12-13 DIAGNOSIS — G4733 Obstructive sleep apnea (adult) (pediatric): Secondary | ICD-10-CM | POA: Diagnosis not present

## 2013-12-13 DIAGNOSIS — M25511 Pain in right shoulder: Secondary | ICD-10-CM | POA: Diagnosis not present

## 2013-12-13 DIAGNOSIS — S4991XA Unspecified injury of right shoulder and upper arm, initial encounter: Secondary | ICD-10-CM

## 2013-12-13 DIAGNOSIS — M25611 Stiffness of right shoulder, not elsewhere classified: Secondary | ICD-10-CM

## 2013-12-13 DIAGNOSIS — Z9989 Dependence on other enabling machines and devices: Principal | ICD-10-CM

## 2013-12-13 NOTE — Progress Notes (Signed)
I agree with the assessment and plan as directed by NP .The patient is known to me .   Corley Maffeo, MD  

## 2013-12-13 NOTE — Patient Instructions (Signed)
Sleep Apnea  Sleep apnea is a sleep disorder characterized by abnormal pauses in breathing while you sleep. When your breathing pauses, the level of oxygen in your blood decreases. This causes you to move out of deep sleep and into light sleep. As a result, your quality of sleep is poor, and the system that carries your blood throughout your body (cardiovascular system) experiences stress. If sleep apnea remains untreated, the following conditions can develop:  High blood pressure (hypertension).  Coronary artery disease.  Inability to achieve or maintain an erection (impotence).  Impairment of your thought process (cognitive dysfunction). There are three types of sleep apnea: 1. Obstructive sleep apnea--Pauses in breathing during sleep because of a blocked airway. 2. Central sleep apnea--Pauses in breathing during sleep because the area of the brain that controls your breathing does not send the correct signals to the muscles that control breathing. 3. Mixed sleep apnea--A combination of both obstructive and central sleep apnea. RISK FACTORS The following risk factors can increase your risk of developing sleep apnea:  Being overweight.  Smoking.  Having narrow passages in your nose and throat.  Being of older age.  Being male.  Alcohol use.  Sedative and tranquilizer use.  Ethnicity. Among individuals younger than 35 years, African Americans are at increased risk of sleep apnea. SYMPTOMS   Difficulty staying asleep.  Daytime sleepiness and fatigue.  Loss of energy.  Irritability.  Loud, heavy snoring.  Morning headaches.  Trouble concentrating.  Forgetfulness.  Decreased interest in sex. DIAGNOSIS  In order to diagnose sleep apnea, your caregiver will perform a physical examination. Your caregiver may suggest that you take a home sleep test. Your caregiver may also recommend that you spend the night in a sleep lab. In the sleep lab, several monitors record  information about your heart, lungs, and brain while you sleep. Your leg and arm movements and blood oxygen level are also recorded. TREATMENT The following actions may help to resolve mild sleep apnea:  Sleeping on your side.   Using a decongestant if you have nasal congestion.   Avoiding the use of depressants, including alcohol, sedatives, and narcotics.   Losing weight and modifying your diet if you are overweight. There also are devices and treatments to help open your airway:  Oral appliances. These are custom-made mouthpieces that shift your lower jaw forward and slightly open your bite. This opens your airway.  Devices that create positive airway pressure. This positive pressure "splints" your airway open to help you breathe better during sleep. The following devices create positive airway pressure:  Continuous positive airway pressure (CPAP) device. The CPAP device creates a continuous level of air pressure with an air pump. The air is delivered to your airway through a mask while you sleep. This continuous pressure keeps your airway open.  Nasal expiratory positive airway pressure (EPAP) device. The EPAP device creates positive air pressure as you exhale. The device consists of single-use valves, which are inserted into each nostril and held in place by adhesive. The valves create very little resistance when you inhale but create much more resistance when you exhale. That increased resistance creates the positive airway pressure. This positive pressure while you exhale keeps your airway open, making it easier to breath when you inhale again.  Bilevel positive airway pressure (BPAP) device. The BPAP device is used mainly in patients with central sleep apnea. This device is similar to the CPAP device because it also uses an air pump to deliver continuous air pressure   through a mask. However, with the BPAP machine, the pressure is set at two different levels. The pressure when you  exhale is lower than the pressure when you inhale.  Surgery. Typically, surgery is only done if you cannot comply with less invasive treatments or if the less invasive treatments do not improve your condition. Surgery involves removing excess tissue in your airway to create a wider passage way. Document Released: 01/29/2002 Document Revised: 06/05/2012 Document Reviewed: 06/17/2011 ExitCare Patient Information 2015 [employer]. This information is not intended to replace advice given to you by your health care provider. Make sure you discuss any questions you have with your health care provider.  

## 2013-12-13 NOTE — Progress Notes (Signed)
PATIENT: Vincent Sharp DOB: [birth year]  REASON FOR VISIT: follow up HISTORY FROM: patient  HISTORY OF PRESENT ILLNESS: Vincent Sharp is a 80 year old male with a history of OSA on BiPAP. He returns today for a 30 days compliance download. He brought his machine with him today and his reports shows an AHI of 8.0 on BiPAP. He uses his machine for average of 5 hours and 53 minutes a night. Patient compliance is 60 % -only used the machine for >4 hour for 18 days. He does have a leak at 17.9L/min. Patient reports that he gets about 6-7 hours of sleep a night. He goes to bed around 12 am and arises between 6-7am. He denies having trouble falling asleep or staying a sleep. Patient does have trouble falling and staying asleep with the BiPAP on. He recently fell while moving tree limbs. He fell on the right shoulder and hip. Since the fall he has had limited range of motion of the right arm. His urologist ordered an x-ray of the right shoulder that was unremarkable. He states that it is also painful when he lays down to sleep. He explains that this is part of the reason he does not use CPAP like he should. He has not followed up with his PCP regarding this shoulder injury. He states that he has a bruise on the left hip and it is sore but has not affected his walking.   HISTORY: 80 year old male with history of obstructive sleep Apnea . He returns today for a 30 days compliance download. He brought his machine with him today and his reports shows an AHI of 8.1 on BiPAP. He uses his machine for average of 2 hours a night. Patient did not use his machine 16 out of 30 days. His Epworth score is 14 points was previously 15 points. His fatigue severity score is 16 was previously 24. Patient reports that he gets about 6-7 hours of sleep a night. He goes to bed around 12 am and arises between 6-7am. He denies having trouble falling asleep or staying a sleep. Patient does have trouble falling and staying asleep with  the BiPAP on. States that the gets up about 1-2 times a night to urinate. Patient has had basal cell skin cancer spots removed from his face. He states that is one reason that he did not wear the BiPAP because the mask and straps put pressure on the spots he had removed. Patient was also have shoulder pain and having steroid injections and states at night he tossed and turned trying to get comfortable so the mask would come off.   REVIEW OF SYSTEMS: Full 14 system review of systems performed and notable only for:  Constitutional: N/A  Eyes: N/A Ear/Nose/Throat: N/A  Skin: N/A  Cardiovascular: N/A  Respiratory: N/A  Gastrointestinal: N/A  Genitourinary: N/A Hematology/Lymphatic: N/A  Endocrine: N/A Musculoskeletal:N/A  Allergy/Immunology: N/A  Neurological: N/A Psychiatric: N/A Sleep: N/A   ALLERGIES: No Known Allergies  HOME MEDICATIONS: Outpatient Prescriptions Prior to Visit  Medication Sig Dispense Refill  . aspirin 81 MG tablet Take 81 mg by mouth daily.      Marland Kitchen atorvastatin (LIPITOR) 40 MG tablet Take 40 mg by mouth daily. Taking 40 mg MWF, 20 mg other days      . Coenzyme Q10 (CO Q-10) 100 MG CAPS Take 1 capsule by mouth daily.      . Cyanocobalamin (B-12) 500 MCG TABS Take 1 tablet by mouth daily.      Marland Kitchen  nebivolol (BYSTOLIC) 10 MG tablet Take 10 mg by mouth daily.      . Omega-3 Fatty Acids (FISH OIL) 300 MG CAPS Take 2 capsules by mouth daily.      . ramipril (ALTACE) 10 MG capsule Take 10 mg by mouth daily.       No facility-administered medications prior to visit.    PAST MEDICAL HISTORY: Past Medical History  Diagnosis Date  . Hypertension   . Hyperlipidemia   . Heart disease   . Depression   . Cancer     skin, colon  . Hypersomnia, persistent 01/11/2013    Loud snoring and daytime sleepy for many years, fell asleep at a red light.   . OSA on CPAP   . Obesity hypoventilation syndrome   . Complex sleep apnea syndrome 05/18/2013    PAST SURGICAL  HISTORY: Past Surgical History  Procedure Laterality Date  . Polypectomy  1989  . Gastric bypass  1992  . Testicle surgery  2005  . Cystectomy  1960    spine  . Cataract extraction Bilateral     FAMILY HISTORY: Family History  Problem Relation Age of Onset  . Heart disease Mother     SOCIAL HISTORY: History   Social History  . Marital Status: Married    Spouse Name: Vincent Sharp    Number of Children: 3  . Years of Education: HS   Occupational History  . RET [employer]    N/A  .  [place] History Main Topics  . Smoking status: Never Smoker   . Smokeless tobacco: Never Used  . Alcohol Use: No  . Drug Use: No  . Sexual Activity: No   Other Topics Concern  . Not on file   Social History Narrative   Patient lives at home with his spouseWilburn Sharp)   Patient has three children.   Married 57 yrs   Caffeine Use: 1/2 cup daily.   Patient is retired.   Patient is right-handed.      PHYSICAL EXAM  Filed Vitals:   12/13/13 1036  BP: 144/70  Pulse: 65  Height: 6' 0.5" (1.842 m)  Weight: 250 lb 6.4 oz (113.581 kg)   Body mass index is 33.48 kg/(m^2).  Generalized: Well developed, in no acute distress  NECK: circumference 17 inches and mallampatti 4+  Neurological examination  Mentation: Alert oriented to time, place, history taking. Follows all commands speech and language fluent  Cranial nerve II-XII: Extraocular movements were full, visual field were full on confrontational test.  Motor: The motor testing reveals 5 over 5 strength of all 4 extremities except the right upper extremity.limited range of movement in the right arm. Can only extend the arm approximately 30 degrees, can not abduct the arm but 10 degrees.  Sensory: Sensory testing is intact to soft touch on all 4 extremities. No evidence of extinction is noted.  Coordination: Cerebellar testing reveals good finger-nose-finger and heel-to-shin bilaterally.  Gait and station: Gait is  normal. Romberg is negative. No drift is seen.  Reflexes: Deep tendon reflexes are symmetric and normal bilaterally.     DIAGNOSTIC DATA (LABS, IMAGING, TESTING) - I reviewed patient records, labs, notes, testing and imaging myself where available.  Lab Results  Component Value Date   WBC 7.0 09/10/2010   HGB 14.3 09/23/2010   HCT 42.0 09/23/2010   MCV 88.7 09/10/2010   PLT 85* 09/10/2010      Component Value Date/Time   NA 142 09/23/2010 1229  K 4.0 09/23/2010 1229   CL 105 09/10/2010 1014   CO2 29 09/10/2010 1014   GLUCOSE 89 09/23/2010 1229   BUN 25* 09/10/2010 1014   CREATININE 1.00 09/10/2010 1014   CALCIUM 9.1 09/10/2010 1014   GFRNONAA >60 09/10/2010 1014   GFRAA >60 09/10/2010 1014    Lab Results  Component Value Date   HGBA1C 5.5 09/08/2012   Lab Results  Component Value Date   VITAMINB12 1225* 09/08/2012       ASSESSMENT AND PLAN 80 y.o. year old male  has a past medical history of Hypertension; Hyperlipidemia; Heart disease; Depression; Cancer; Hypersomnia, persistent (01/11/2013); OSA on CPAP; Obesity hypoventilation syndrome; and Complex sleep apnea syndrome (05/18/2013). here with:  1. OSA on CPAP 2. Right shoulder injury 3. Limited ROM of right shoulder  Patient's compliance is still not where we would like it to be- would like it >90%.. However he does have a leak with his mask that may be causing the AHI to artifically be elevated. It also may be affecting how often he uses the machine. I have requested that the patient's mask be refitted. Patient does have limited ROM with the right shoulder. Previous x-ray was unremarkable. I will send the patient for an MRI w/o contrast of the right shoulder to look for a possible rotator cuff tear. The patient will follow-up in 2 months.   Ward Givens, MSN, NP-C 12/13/2013, 10:48 AM [facility] [address] [place], [place] [address] [phone]  Note: This document was prepared with digital  dictation and possible smart phrase technology. Any transcriptional errors that result from this process are unintentional.

## 2013-12-14 DIAGNOSIS — S7001XA Contusion of right hip, initial encounter: Secondary | ICD-10-CM | POA: Diagnosis not present

## 2013-12-14 DIAGNOSIS — S46011A Strain of muscle(s) and tendon(s) of the rotator cuff of right shoulder, initial encounter: Secondary | ICD-10-CM | POA: Diagnosis not present

## 2013-12-17 DIAGNOSIS — M25511 Pain in right shoulder: Secondary | ICD-10-CM | POA: Diagnosis not present

## 2013-12-18 DIAGNOSIS — S7001XD Contusion of right hip, subsequent encounter: Secondary | ICD-10-CM | POA: Diagnosis not present

## 2013-12-18 DIAGNOSIS — S46011D Strain of muscle(s) and tendon(s) of the rotator cuff of right shoulder, subsequent encounter: Secondary | ICD-10-CM | POA: Diagnosis not present

## 2013-12-19 ENCOUNTER — Encounter (HOSPITAL_COMMUNITY): Payer: Self-pay | Admitting: Pharmacy Technician

## 2013-12-24 ENCOUNTER — Encounter (HOSPITAL_COMMUNITY): Payer: Self-pay

## 2013-12-24 ENCOUNTER — Encounter (HOSPITAL_COMMUNITY)
Admission: RE | Admit: 2013-12-24 | Discharge: 2013-12-24 | Disposition: A | Discharge status: Home / Self Care | Payer: Medicare Other | Source: Ambulatory Visit | Attending: Orthopedic Surgery | Admitting: Orthopedic Surgery

## 2013-12-24 ENCOUNTER — Ambulatory Visit (HOSPITAL_COMMUNITY)
Admission: RE | Admit: 2013-12-24 | Discharge: 2013-12-24 | Disposition: A | Discharge status: Home / Self Care | Payer: Medicare Other | Source: Ambulatory Visit | Attending: Anesthesiology | Admitting: Anesthesiology

## 2013-12-24 DIAGNOSIS — Z01818 Encounter for other preprocedural examination: Secondary | ICD-10-CM | POA: Insufficient documentation

## 2013-12-24 DIAGNOSIS — Z951 Presence of aortocoronary bypass graft: Secondary | ICD-10-CM | POA: Diagnosis not present

## 2013-12-24 DIAGNOSIS — I517 Cardiomegaly: Secondary | ICD-10-CM | POA: Diagnosis not present

## 2013-12-24 DIAGNOSIS — I2510 Atherosclerotic heart disease of native coronary artery without angina pectoris: Secondary | ICD-10-CM | POA: Diagnosis not present

## 2013-12-24 DIAGNOSIS — E785 Hyperlipidemia, unspecified: Secondary | ICD-10-CM | POA: Diagnosis not present

## 2013-12-24 DIAGNOSIS — I519 Heart disease, unspecified: Secondary | ICD-10-CM | POA: Diagnosis not present

## 2013-12-24 DIAGNOSIS — I10 Essential (primary) hypertension: Secondary | ICD-10-CM | POA: Insufficient documentation

## 2013-12-24 DIAGNOSIS — I700 Atherosclerosis of aorta: Secondary | ICD-10-CM | POA: Diagnosis not present

## 2013-12-24 HISTORY — DX: Atherosclerotic heart disease of native coronary artery without angina pectoris: I25.10

## 2013-12-24 HISTORY — DX: Pneumonia, unspecified organism: J18.9

## 2013-12-24 LAB — CBC WITH DIFFERENTIAL/PLATELET
Basophils Absolute: 0 10*3/uL (ref 0.0–0.1)
Basophils Relative: 1 % (ref 0–1)
Eosinophils Absolute: 0.1 10*3/uL (ref 0.0–0.7)
Eosinophils Relative: 3 % (ref 0–5)
HCT: 40 % (ref 39.0–52.0)
HEMOGLOBIN: 14.5 g/dL (ref 13.0–17.0)
LYMPHS ABS: 1.7 10*3/uL (ref 0.7–4.0)
Lymphocytes Relative: 35 % (ref 12–46)
MCH: 32.5 pg (ref 26.0–34.0)
MCHC: 36.3 g/dL — ABNORMAL HIGH (ref 30.0–36.0)
MCV: 89.7 fL (ref 78.0–100.0)
MONOS PCT: 9 % (ref 3–12)
Monocytes Absolute: 0.4 10*3/uL (ref 0.1–1.0)
NEUTROS PCT: 53 % (ref 43–77)
Neutro Abs: 2.5 10*3/uL (ref 1.7–7.7)
PLATELETS: 99 10*3/uL — AB (ref 150–400)
RBC: 4.46 MIL/uL (ref 4.22–5.81)
RDW: 12.7 % (ref 11.5–15.5)
WBC: 4.7 10*3/uL (ref 4.0–10.5)

## 2013-12-24 LAB — COMPREHENSIVE METABOLIC PANEL
ALK PHOS: 90 U/L (ref 39–117)
ALT: 31 U/L (ref 0–53)
AST: 33 U/L (ref 0–37)
Albumin: 3.5 g/dL (ref 3.5–5.2)
Anion gap: 11 (ref 5–15)
BUN: 26 mg/dL — ABNORMAL HIGH (ref 6–23)
CO2: 28 meq/L (ref 19–32)
Calcium: 9.2 mg/dL (ref 8.4–10.5)
Chloride: 101 mEq/L (ref 96–112)
Creatinine, Ser: 1.05 mg/dL (ref 0.50–1.35)
GFR, EST AFRICAN AMERICAN: 76 mL/min — AB (ref >90)
GFR, EST NON AFRICAN AMERICAN: 65 mL/min — AB (ref >90)
GLUCOSE: 92 mg/dL (ref 70–99)
POTASSIUM: 4.2 meq/L (ref 3.7–5.3)
SODIUM: 140 meq/L (ref 137–147)
Total Bilirubin: 1 mg/dL (ref 0.3–1.2)
Total Protein: 6.5 g/dL (ref 6.0–8.3)

## 2013-12-24 LAB — PROTIME-INR
INR: 1.12 (ref 0.00–1.49)
Prothrombin Time: 14.5 seconds (ref 11.6–15.2)

## 2013-12-24 LAB — APTT: aPTT: 33 seconds (ref 24–37)

## 2013-12-24 MED ORDER — CHLORHEXIDINE GLUCONATE 4 % EX LIQD: 60.0000 mL | Freq: Once | CUTANEOUS | Status: DC

## 2013-12-24 NOTE — Pre-Procedure Instructions (Signed)
Vincent Sharp  12/24/2013   Your procedure is scheduled on:  Thursday November 5 th at 75 PM  Report to [facility] Admitting at 1040 AM.  Call this number if you have problems the morning of surgery: [phone]   Remember:   Do not eat food or drink liquids after midnight.   Take these medicines the morning of surgery with A SIP OF WATER: Bystolic   Do not wear jewelry.  Do not wear lotions, powders, or perfumes. You may not wear deodorant.   Men may shave face and neck.  Do not bring valuables to the hospital.  [facility] is not responsible   for any belongings or valuables.               Contacts, dentures or bridgework may not be worn into surgery.  Leave suitcase in the car. After surgery it may be brought to your room.  For patients admitted to the hospital, discharge time is determined by your  treatment team.               Patients discharged the day of surgery will not be allowed to drive home.      Special Instructions: [place] - Preparing for Surgery  Before surgery, you can play an important role.  Because skin is not sterile, your skin needs to be as free of germs as possible.  You can reduce the number of germs on you skin by washing with CHG (chlorahexidine gluconate) soap before surgery.  CHG is an antiseptic cleaner which kills germs and bonds with the skin to continue killing germs even after washing.  Please DO NOT use if you have an allergy to CHG or antibacterial soaps.  If your skin becomes reddened/irritated stop using the CHG and inform your nurse when you arrive at Short Stay.  Do not shave (including legs and underarms) for at least 48 hours prior to the first CHG shower.  You may shave your face.  Please follow these instructions carefully:   1.  Shower with CHG Soap the night before surgery and the                                morning of Surgery.  2.  If you choose to wash your hair, wash your hair first as usual with your        normal shampoo.  3.  After you shampoo, rinse your hair and body thoroughly to remove the                      Shampoo.  4.  Use CHG as you would any other liquid soap.  You can apply chg directly       to the skin and wash gently with scrungie or a clean washcloth.  5.  Apply the CHG Soap to your body ONLY FROM THE NECK DOWN.        Do not use on open wounds or open sores.  Avoid contact with your eyes,       ears, mouth and genitals (private parts).  Wash genitals (private parts)       with your normal soap.  6.  Wash thoroughly, paying special attention to the area where your surgery        will be performed.  7.  Thoroughly rinse your body with warm water from the neck down.  8.  DO NOT shower/wash with your normal soap after using and rinsing off       the CHG Soap.  9.  Pat yourself dry with a clean towel.            10.  Wear clean pajamas.            11.  Place clean sheets on your bed the night of your first shower and do not        sleep with pets.  Day of Surgery  Do not apply any lotions/deoderants the morning of surgery.  Please wear clean clothes to the hospital/surgery center.      Please read over the following fact sheets that you were given: Pain Booklet, Coughing and Deep Breathing and Surgical Site Infection Prevention

## 2013-12-25 NOTE — Progress Notes (Signed)
Anesthesia Chart Review:  Patient is a 80 year old male scheduled for right shoulder arthroscopy with subacromial decompression, distal clavicle resection and rotator cuff repair on 12/27/13 by Dr. Onnie Graham.  History includes non-smoker, HTN, HLD, CAD s/p CABG '92, OSA/obesity hypoventilation syndrome on CPAP, anemia, bruises easily, skin cancer, colon cancer. PCP is Dr. Reynaldo Minium, last visit 09/03/13.  Cardiologist is Dr. Wynonia Lawman, records are still pending.  BMI is consistent with obesity.  Meds: MVI, fish oil, Tylenol, ASA 81 mg, Lipitor, Bystolic, ramipril.  EKG on 12/24/13: SR with first degree AVB, LAD, right BBB.  No significant change since last tracing (09/23/10).   Cardiac cath on 01/11/08:  1. Probable recent occlusion of the saphenous vein graft to the diagonal branch, which is unprotected at the present time and a likely source of ischemia. 2. Patent internal mammary graft to the left anterior descending coronary artery and a patent saphenous vein graft to the obtuse marginal. 3. Severe native two-vessel coronary artery disease with mild to moderate disease involving the right coronary artery. 4. Normal left ventricular function. RECOMMENDATIONS: Intensive medical therapy and attention to riskfactors. Continued medical therapy. The vessel in question is notsuitable for percutaneous intervention (Dr. Wynonia Lawman).  09/01/12 carotid duplex: < 40% bilateral ICA stenosis, antegrade VA flow.    Preoperative labs noted.  PLT count 99K, previously 168K on 08/27/13 (PCP). No reported history of thrombocytopenia and not mentioned his his last PCP office note.  PLT count called to Abigail Butts at Dr. Susie Cassette office.  Still no cardiology records yet.  Surgery in scheduled for 12/27/13.  I reviewed above with anesthesiologist Dr. Tamala Julian.  Based on what is known now would recommend cardiology clearance to ensure his CAD is still optimally medically managed.  I have left a message with Abigail Butts at Dr. Susie Cassette office.     George Hugh [facility] Short Stay Center/Anesthesiology Phone [phone] 12/25/2013 5:01 PM

## 2013-12-26 ENCOUNTER — Encounter (HOSPITAL_COMMUNITY): Payer: Self-pay | Admitting: Cardiology

## 2013-12-26 DIAGNOSIS — I2510 Atherosclerotic heart disease of native coronary artery without angina pectoris: Secondary | ICD-10-CM

## 2013-12-26 DIAGNOSIS — Z951 Presence of aortocoronary bypass graft: Secondary | ICD-10-CM

## 2013-12-26 DIAGNOSIS — I10 Essential (primary) hypertension: Secondary | ICD-10-CM

## 2013-12-26 DIAGNOSIS — Z01810 Encounter for preprocedural cardiovascular examination: Secondary | ICD-10-CM | POA: Diagnosis not present

## 2013-12-26 DIAGNOSIS — E668 Other obesity: Secondary | ICD-10-CM | POA: Diagnosis not present

## 2013-12-26 DIAGNOSIS — E785 Hyperlipidemia, unspecified: Secondary | ICD-10-CM

## 2013-12-26 HISTORY — DX: Essential (primary) hypertension: I10

## 2013-12-26 HISTORY — DX: Atherosclerotic heart disease of native coronary artery without angina pectoris: I25.10

## 2013-12-26 MED ORDER — LACTATED RINGERS IV SOLN
INTRAVENOUS | Status: DC
  Administered 2013-12-27: 14:00:00 via INTRAVENOUS

## 2013-12-26 MED ORDER — CEFAZOLIN SODIUM-DEXTROSE 2-3 GM-% IV SOLR
2.0000 g | INTRAVENOUS | Status: AC
  Administered 2013-12-27: 2 g via INTRAVENOUS
  Filled 2013-12-26: qty 50

## 2013-12-26 NOTE — Progress Notes (Signed)
Vincent Sharp    Date of visit:  12/26/2013 DOB:  [birth year]    Age:  80 yrs. Medical record number:  [id number]     Account number:  [id number] Primary Care Provider: Burnard Bunting A ____________________________ CURRENT DIAGNOSES  1. Encounter for preprocedural cardiovascular examination  2. Atherosclerotic heart disease of native coronary artery without angina pectoris  3. Essential (primary) hypertension  4. Obesity  5. Hyperlipidemia  6. Personal history of other malignant neoplasm of large intestine  7. Presence of aortocoronary bypass graft  8. Sleep apnea ____________________________ ALLERGIES  No Known Drug Allergies ____________________________ MEDICATIONS  1. nitroglycerin 0.4 mg Tablet, Sublingual, Take as directed  2. Bystolic 20 mg tablet, 1/2 tab daily  3. atorvastatin 10 mg tablet, 2 tabs  3 x week  4. CoQ-10 100 mg capsule, 1 p.o. daily  5. ramipril 10 mg capsule, 1 p.o. daily  6. aspirin 81 mg chewable tablet, 1 p.o. daily ____________________________ CHIEF COMPLAINTS  Surgical clearance ____________________________ HISTORY OF PRESENT ILLNESS Patient seen for preoperative cardiac evaluation. He fell several weeks ago injuring his right shoulder and is in need of orthopedic surgery tomorrow. We were asked to evaluate urgently for cardiac evaluation. He has a previous history of coronary artery disease with previous mammary bypass to the LAD. He occluded a vein graft to the diagonal a few years ago and had angina at the time but has developed collaterals to that area and is currently without angina. He had minimal disease in the other vessels at that time. He denies PND, orthopnea, or claudication. His EKG from surgical center was reviewed showing a right bundle branch block but no ischemic changes ____________________________ PAST HISTORY  Past Medical Illnesses:  hypertension, hyperlipidemia, sleep apnea, history of colon cancer treated with surgery, obesity, skin  cancer, depression, history of cellulitis, shingles 2013;  Cardiovascular Illnesses:  CAD;  Infectious Diseases:  no previous history of significant infectious diseases;  Surgical Procedures:  inguinal herniorrhaphy-rt, hydrocele surgery, salivary gland surgery, colectomy-partial, hemorrhoidectomy, tonsillectomy/adenoids, arthroscopic rt knee surgery;  Trauma History:  no previous history of significant trauma;  NYHA Classification:  I;  Canadian Angina Classification:  Class 1: Angina with strenuous Exercise;  Cardiology Procedures-Invasive:  cardiac cath (left) November 2009, CABG with LIMA to LAD SVG to OM, SVG to dx1 1992 Dr. Redmond Pulling;  Cardiology Procedures-Noninvasive:  no previous non-invasive cardiovascular testing;  Cardiac Cath Results:  scattered irregularities Left main, occluded LAD, occluded CFX, 40% stenosis proximal RCA, widely patent OM 1 SVG, widely patent LAD LIMA graft and fills first diagonal branch but with stenosis between mammary insertion site and diagonal branch, occluded Diag 1 SVG;  LVEF of 60% documented via cardiac cath on 01/11/2008,   ____________________________ CARDIO-PULMONARY TEST DATES EKG Date:  12/25/2013;   Cardiac Cath Date:  01/11/2008;  CABG: 07/26/1990;  Chest Xray Date: 01/02/2008;   ____________________________ FAMILY HISTORY Brother -- Brother alive and well Brother -- Brother alive with problem, Prostate cancer Brother -- Brother dead, Coronary Artery Disease Father -- Father dead, Carcinoma of the pancreas, Deceased Mother -- Mother dead, Congestive heart failure, Deceased Sister -- Sister alive with problem Sister -- Sister dead, Carcinoma of breast Sister -- Sister dead, Carcinoma of breast ____________________________ SOCIAL HISTORY Alcohol Use:  does not use alcohol;  Smoking:  nonsmoker;  Diet:  regular diet;  Lifestyle:  married;  Exercise:  some exercise;  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[employer];  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  wife;   ____________________________ REVIEW OF  SYSTEMS General:  obesity Eyes: wears eye glasses/contact lenses, cataract extraction with lens implants bilaterally, decreased acuity left eye Respiratory: denies dyspnea, cough, wheezing or hemoptysis. Cardiovascular:  please review HPI Abdominal: denies dyspepsia, GI bleeding, constipation, or diarrhea Genitourinary-Male: history of urinary retention  Musculoskeletal:  see HPI Neurological:  denies headaches, stroke, or TIA  ____________________________ PHYSICAL EXAMINATION VITAL SIGNS  Blood Pressure:  142/80 Sitting, Left arm, large cuff  , 144/74 Standing, Left arm and large cuff   Pulse:  76/min. Weight:  254.00 lbs. Height:  72"BMI: 34  Constitutional:  pleasant white male in no acute distress, severely obese Skin:  warm and dry to touch, no apparent skin lesions, or masses noted. Head:  normocephalic, normal hair pattern, no masses or tenderness ENT:  ears, nose and throat reveal no gross abnormalities.  Dentition good. Neck:  supple, without massess. No JVD, thyromegaly or carotid bruits. Carotid upstroke normal. Chest:  normal symmetry, clear to auscultation, healed median sternotomy scar Cardiac:  regular rhythm, normal S1 and S2, No S3 or S4, no murmurs, gallops or rubs detected. Peripheral Pulses:  the femoral,dorsalis pedis, and posterior tibial pulses are full and equal bilaterally with no bruits auscultated. Extremities & Back:  well healed saphenous vein donor site RLE, bilateral venous insufficiency changes present, 2+ edema right leg, Right shoulder with limited ROM Neurological:  no gross motor or sensory deficits noted, affect appropriate, oriented x3. ____________________________ MOST RECENT LIPID PANEL 08/06/13  CHOL TOTL 204 mg/dl, LDL 129 NM, HDL 34 mg/dl, TRIGLYCER 202 mg/dl and CHOL/HDL 5.9 (Calc) ____________________________ IMPRESSIONS/PLAN 1. From a cardiovascular viewpoint may proceed with the planned orthopedic  surgery. His surgical risk should be average and he may undergo general anesthesia without cardiac workup 2. Coronary artery disease with previous bypass grafting and angina which has been stable 3. Obesity does need to lose weight  Recommendations:  EKG reviewed. Right bundle branch block is present and he is in sinus rhythm. I would be happy to see him at the time of surgery should the need arise.  ____________________________ Cardiology Physician:  Kerry Hough MD [facility]

## 2013-12-27 ENCOUNTER — Encounter (HOSPITAL_COMMUNITY): Admission: RE | Payer: Self-pay | Source: Ambulatory Visit

## 2013-12-27 ENCOUNTER — Encounter (HOSPITAL_COMMUNITY): Payer: Self-pay | Admitting: *Deleted

## 2013-12-27 ENCOUNTER — Encounter (HOSPITAL_COMMUNITY)
Admission: RE | Disposition: A | Discharge status: Home / Self Care | Payer: Self-pay | Source: Ambulatory Visit | Attending: Orthopedic Surgery

## 2013-12-27 ENCOUNTER — Inpatient Hospital Stay (HOSPITAL_COMMUNITY): Payer: Medicare Other | Admitting: Emergency Medicine

## 2013-12-27 ENCOUNTER — Inpatient Hospital Stay (HOSPITAL_COMMUNITY): Admission: RE | Admit: 2013-12-27 | Payer: Medicare Other | Source: Ambulatory Visit | Admitting: Orthopedic Surgery

## 2013-12-27 ENCOUNTER — Observation Stay (HOSPITAL_COMMUNITY)
Admission: RE | Admit: 2013-12-27 | Discharge: 2013-12-28 | Disposition: A | Discharge status: Home / Self Care | Payer: Medicare Other | Source: Ambulatory Visit | Attending: Orthopedic Surgery | Admitting: Orthopedic Surgery

## 2013-12-27 DIAGNOSIS — I2510 Atherosclerotic heart disease of native coronary artery without angina pectoris: Secondary | ICD-10-CM | POA: Diagnosis not present

## 2013-12-27 DIAGNOSIS — G4733 Obstructive sleep apnea (adult) (pediatric): Secondary | ICD-10-CM | POA: Diagnosis not present

## 2013-12-27 DIAGNOSIS — Z23 Encounter for immunization: Secondary | ICD-10-CM | POA: Diagnosis not present

## 2013-12-27 DIAGNOSIS — S46011A Strain of muscle(s) and tendon(s) of the rotator cuff of right shoulder, initial encounter: Secondary | ICD-10-CM | POA: Diagnosis not present

## 2013-12-27 DIAGNOSIS — Z7982 Long term (current) use of aspirin: Secondary | ICD-10-CM | POA: Diagnosis not present

## 2013-12-27 DIAGNOSIS — E662 Morbid (severe) obesity with alveolar hypoventilation: Secondary | ICD-10-CM | POA: Insufficient documentation

## 2013-12-27 DIAGNOSIS — Z79899 Other long term (current) drug therapy: Secondary | ICD-10-CM | POA: Diagnosis not present

## 2013-12-27 DIAGNOSIS — G8918 Other acute postprocedural pain: Secondary | ICD-10-CM | POA: Diagnosis not present

## 2013-12-27 DIAGNOSIS — M19011 Primary osteoarthritis, right shoulder: Secondary | ICD-10-CM | POA: Insufficient documentation

## 2013-12-27 DIAGNOSIS — E785 Hyperlipidemia, unspecified: Secondary | ICD-10-CM | POA: Diagnosis not present

## 2013-12-27 DIAGNOSIS — S46019A Strain of muscle(s) and tendon(s) of the rotator cuff of unspecified shoulder, initial encounter: Secondary | ICD-10-CM | POA: Diagnosis present

## 2013-12-27 DIAGNOSIS — M75101 Unspecified rotator cuff tear or rupture of right shoulder, not specified as traumatic: Secondary | ICD-10-CM | POA: Diagnosis not present

## 2013-12-27 DIAGNOSIS — I10 Essential (primary) hypertension: Secondary | ICD-10-CM | POA: Diagnosis not present

## 2013-12-27 DIAGNOSIS — Z85038 Personal history of other malignant neoplasm of large intestine: Secondary | ICD-10-CM | POA: Insufficient documentation

## 2013-12-27 DIAGNOSIS — Z6834 Body mass index (BMI) 34.0-34.9, adult: Secondary | ICD-10-CM | POA: Diagnosis not present

## 2013-12-27 DIAGNOSIS — Z79891 Long term (current) use of opiate analgesic: Secondary | ICD-10-CM | POA: Insufficient documentation

## 2013-12-27 DIAGNOSIS — S46001A Unspecified injury of muscle(s) and tendon(s) of the rotator cuff of right shoulder, initial encounter: Secondary | ICD-10-CM | POA: Diagnosis not present

## 2013-12-27 DIAGNOSIS — Z951 Presence of aortocoronary bypass graft: Secondary | ICD-10-CM | POA: Diagnosis not present

## 2013-12-27 DIAGNOSIS — M7541 Impingement syndrome of right shoulder: Secondary | ICD-10-CM | POA: Insufficient documentation

## 2013-12-27 DIAGNOSIS — W19XXXA Unspecified fall, initial encounter: Secondary | ICD-10-CM | POA: Insufficient documentation

## 2013-12-27 HISTORY — PX: SHOULDER ARTHROSCOPY WITH ROTATOR CUFF REPAIR AND SUBACROMIAL DECOMPRESSION: SHX5686

## 2013-12-27 SURGERY — SHOULDER ARTHROSCOPY WITH ROTATOR CUFF REPAIR AND SUBACROMIAL DECOMPRESSION
Anesthesia: General | Site: Shoulder | Laterality: Right

## 2013-12-27 MED ORDER — METOCLOPRAMIDE HCL 5 MG PO TABS: 5.0000 mg | ORAL_TABLET | Freq: Three times a day (TID) | ORAL | Status: DC | PRN

## 2013-12-27 MED ORDER — METOCLOPRAMIDE HCL 5 MG/ML IJ SOLN: 5.0000 mg | Freq: Three times a day (TID) | INTRAMUSCULAR | Status: DC | PRN

## 2013-12-27 MED ORDER — ACETAMINOPHEN 325 MG PO TABS
650.0000 mg | ORAL_TABLET | Freq: Four times a day (QID) | ORAL | Status: DC | PRN
Start: 2013-12-27 — End: 2013-12-28

## 2013-12-27 MED ORDER — PHENOL 1.4 % MT LIQD: 1.0000 | OROMUCOSAL | Status: DC | PRN

## 2013-12-27 MED ORDER — PROPOFOL 10 MG/ML IV BOLUS
INTRAVENOUS | Status: AC
  Filled 2013-12-27: qty 20

## 2013-12-27 MED ORDER — RAMIPRIL 10 MG PO CAPS
10.0000 mg | ORAL_CAPSULE | Freq: Every day | ORAL | Status: DC
  Administered 2013-12-27 – 2013-12-28 (×2): 10 mg via ORAL
  Filled 2013-12-27 (×2): qty 1

## 2013-12-27 MED ORDER — MIDAZOLAM HCL 2 MG/2ML IJ SOLN
INTRAMUSCULAR | Status: AC
  Administered 2013-12-27: 1 mg via INTRAVENOUS
  Filled 2013-12-27: qty 2

## 2013-12-27 MED ORDER — ONDANSETRON HCL 4 MG/2ML IJ SOLN
INTRAMUSCULAR | Status: AC
  Filled 2013-12-27: qty 2

## 2013-12-27 MED ORDER — LACTATED RINGERS IV SOLN: INTRAVENOUS | Status: DC

## 2013-12-27 MED ORDER — ONDANSETRON HCL 4 MG/2ML IJ SOLN: 4.0000 mg | Freq: Four times a day (QID) | INTRAMUSCULAR | Status: DC | PRN

## 2013-12-27 MED ORDER — PROPOFOL 10 MG/ML IV BOLUS
INTRAVENOUS | Status: DC | PRN
  Administered 2013-12-27: 150 mg via INTRAVENOUS

## 2013-12-27 MED ORDER — FENTANYL CITRATE 0.05 MG/ML IJ SOLN
INTRAMUSCULAR | Status: DC | PRN
  Administered 2013-12-27: 50 ug via INTRAVENOUS
  Administered 2013-12-27: 100 ug via INTRAVENOUS

## 2013-12-27 MED ORDER — LIDOCAINE HCL (CARDIAC) 20 MG/ML IV SOLN
INTRAVENOUS | Status: DC | PRN
  Administered 2013-12-27: 100 mg via INTRAVENOUS

## 2013-12-27 MED ORDER — LACTATED RINGERS IV SOLN
INTRAVENOUS | Status: DC | PRN
Start: 2013-12-27 — End: 2013-12-27
  Administered 2013-12-27 (×2): via INTRAVENOUS

## 2013-12-27 MED ORDER — ATORVASTATIN CALCIUM 40 MG PO TABS
40.0000 mg | ORAL_TABLET | ORAL | Status: DC
  Filled 2013-12-27: qty 1

## 2013-12-27 MED ORDER — PROMETHAZINE HCL 25 MG/ML IJ SOLN: 6.2500 mg | INTRAMUSCULAR | Status: DC | PRN

## 2013-12-27 MED ORDER — MENTHOL 3 MG MT LOZG: 1.0000 | LOZENGE | OROMUCOSAL | Status: DC | PRN

## 2013-12-27 MED ORDER — HYDROMORPHONE HCL 1 MG/ML IJ SOLN
0.2500 mg | INTRAMUSCULAR | Status: DC | PRN
  Administered 2013-12-28: 1 mg via INTRAVENOUS
  Filled 2013-12-27: qty 1

## 2013-12-27 MED ORDER — OXYCODONE-ACETAMINOPHEN 5-325 MG PO TABS
1.0000 | ORAL_TABLET | ORAL | Status: DC | PRN
  Administered 2013-12-28: 2 via ORAL
  Filled 2013-12-27: qty 2

## 2013-12-27 MED ORDER — FENTANYL CITRATE 0.05 MG/ML IJ SOLN
INTRAMUSCULAR | Status: AC
  Administered 2013-12-27: 50 ug via INTRAVENOUS
  Filled 2013-12-27: qty 2

## 2013-12-27 MED ORDER — FENTANYL CITRATE 0.05 MG/ML IJ SOLN
INTRAMUSCULAR | Status: AC
  Filled 2013-12-27: qty 2

## 2013-12-27 MED ORDER — INFLUENZA VAC SPLIT QUAD 0.5 ML IM SUSY
0.5000 mL | PREFILLED_SYRINGE | INTRAMUSCULAR | Status: AC
  Administered 2013-12-28: 0.5 mL via INTRAMUSCULAR
  Filled 2013-12-27: qty 0.5

## 2013-12-27 MED ORDER — FENTANYL CITRATE 0.05 MG/ML IJ SOLN
50.0000 ug | INTRAMUSCULAR | Status: DC | PRN
  Administered 2013-12-27: 50 ug via INTRAVENOUS

## 2013-12-27 MED ORDER — MIDAZOLAM HCL 2 MG/2ML IJ SOLN
1.0000 mg | INTRAMUSCULAR | Status: DC | PRN
  Administered 2013-12-27: 1 mg via INTRAVENOUS

## 2013-12-27 MED ORDER — ACETAMINOPHEN 650 MG RE SUPP: 650.0000 mg | Freq: Four times a day (QID) | RECTAL | Status: DC | PRN

## 2013-12-27 MED ORDER — FENTANYL CITRATE 0.05 MG/ML IJ SOLN
25.0000 ug | INTRAMUSCULAR | Status: DC | PRN
  Administered 2013-12-27 (×3): 25 ug via INTRAVENOUS

## 2013-12-27 MED ORDER — NEBIVOLOL HCL 10 MG PO TABS
10.0000 mg | ORAL_TABLET | Freq: Every day | ORAL | Status: DC
  Administered 2013-12-28: 10 mg via ORAL
  Filled 2013-12-27: qty 1

## 2013-12-27 MED ORDER — POLYETHYLENE GLYCOL 3350 17 G PO PACK: 17.0000 g | PACK | Freq: Every day | ORAL | Status: DC | PRN

## 2013-12-27 MED ORDER — ALUM & MAG HYDROXIDE-SIMETH 200-200-20 MG/5ML PO SUSP: 30.0000 mL | ORAL | Status: DC | PRN

## 2013-12-27 MED ORDER — ASPIRIN EC 81 MG PO TBEC
81.0000 mg | DELAYED_RELEASE_TABLET | Freq: Every day | ORAL | Status: DC
  Administered 2013-12-28: 81 mg via ORAL
  Filled 2013-12-27 (×2): qty 1

## 2013-12-27 MED ORDER — MEPERIDINE HCL 25 MG/ML IJ SOLN
6.2500 mg | INTRAMUSCULAR | Status: DC | PRN
Start: 2013-12-27 — End: 2013-12-27

## 2013-12-27 MED ORDER — FLEET ENEMA 7-19 GM/118ML RE ENEM
1.0000 | ENEMA | Freq: Once | RECTAL | Status: AC | PRN
  Filled 2013-12-27: qty 1

## 2013-12-27 MED ORDER — SODIUM CHLORIDE 0.9 % IR SOLN
Status: DC | PRN
  Administered 2013-12-27 (×6): 3000 mL

## 2013-12-27 MED ORDER — FENTANYL CITRATE 0.05 MG/ML IJ SOLN
INTRAMUSCULAR | Status: AC
  Filled 2013-12-27: qty 5

## 2013-12-27 MED ORDER — DEXAMETHASONE SODIUM PHOSPHATE 4 MG/ML IJ SOLN
INTRAMUSCULAR | Status: DC | PRN
  Administered 2013-12-27: 4 mg via INTRAVENOUS

## 2013-12-27 MED ORDER — EPHEDRINE SULFATE 50 MG/ML IJ SOLN
INTRAMUSCULAR | Status: DC | PRN
  Administered 2013-12-27: 10 mg via INTRAVENOUS

## 2013-12-27 MED ORDER — ONDANSETRON HCL 4 MG/2ML IJ SOLN
INTRAMUSCULAR | Status: DC | PRN
  Administered 2013-12-27: 4 mg via INTRAVENOUS

## 2013-12-27 MED ORDER — DOCUSATE SODIUM 100 MG PO CAPS
100.0000 mg | ORAL_CAPSULE | Freq: Two times a day (BID) | ORAL | Status: DC
  Administered 2013-12-27 – 2013-12-28 (×2): 100 mg via ORAL
  Filled 2013-12-27 (×2): qty 1

## 2013-12-27 MED ORDER — SUCCINYLCHOLINE CHLORIDE 20 MG/ML IJ SOLN
INTRAMUSCULAR | Status: DC | PRN
  Administered 2013-12-27: 100 mg via INTRAVENOUS

## 2013-12-27 MED ORDER — ATORVASTATIN CALCIUM 20 MG PO TABS: 20.0000 mg | ORAL_TABLET | Freq: Every day | ORAL | Status: DC

## 2013-12-27 MED ORDER — BISACODYL 5 MG PO TBEC: 5.0000 mg | DELAYED_RELEASE_TABLET | Freq: Every day | ORAL | Status: DC | PRN

## 2013-12-27 MED ORDER — MIDAZOLAM HCL 2 MG/2ML IJ SOLN
INTRAMUSCULAR | Status: AC
  Filled 2013-12-27: qty 2

## 2013-12-27 MED ORDER — PHENYLEPHRINE HCL 10 MG/ML IJ SOLN
10.0000 mg | INTRAVENOUS | Status: DC | PRN
  Administered 2013-12-27: 25 ug/min via INTRAVENOUS

## 2013-12-27 MED ORDER — ASPIRIN 81 MG PO TABS: 81.0000 mg | ORAL_TABLET | Freq: Every day | ORAL | Status: DC

## 2013-12-27 MED ORDER — ATORVASTATIN CALCIUM 20 MG PO TABS
20.0000 mg | ORAL_TABLET | ORAL | Status: DC
  Administered 2013-12-27: 20 mg via ORAL
  Filled 2013-12-27: qty 1

## 2013-12-27 MED ORDER — BUPIVACAINE-EPINEPHRINE (PF) 0.5% -1:200000 IJ SOLN
INTRAMUSCULAR | Status: DC | PRN
  Administered 2013-12-27: 22 mL via PERINEURAL

## 2013-12-27 MED ORDER — DIPHENHYDRAMINE HCL 12.5 MG/5ML PO ELIX: 12.5000 mg | ORAL_SOLUTION | ORAL | Status: DC | PRN

## 2013-12-27 MED ORDER — MIDAZOLAM HCL 5 MG/5ML IJ SOLN
INTRAMUSCULAR | Status: DC | PRN
  Administered 2013-12-27: 1 mg via INTRAVENOUS

## 2013-12-27 MED ORDER — DIAZEPAM 5 MG PO TABS: 5.0000 mg | ORAL_TABLET | Freq: Four times a day (QID) | ORAL | Status: DC | PRN

## 2013-12-27 MED ORDER — ONDANSETRON HCL 4 MG PO TABS: 4.0000 mg | ORAL_TABLET | Freq: Four times a day (QID) | ORAL | Status: DC | PRN

## 2013-12-27 SURGICAL SUPPLY — 73 items
ANCH SUT 2 CRKSRW FT 14X4.5 (Anchor) ×2 IMPLANT
ANCH SUT SWLK 19.1X4.75 VT (Anchor) ×2 IMPLANT
ANCHOR PEEK 4.75X19.1 SWLK C (Anchor) ×4 IMPLANT
ANCHOR PEEK CORKSCREW 4.5 (Anchor) ×2 IMPLANT
ANCHOR PEEK CORKSCREW 4.5MM (Anchor) ×2 IMPLANT
BLADE CUTTER GATOR 3.5 (BLADE) ×3 IMPLANT
BLADE GREAT WHITE 4.2 (BLADE) ×2 IMPLANT
BLADE GREAT WHITE 4.2MM (BLADE) ×1
BLADE SURG 11 STRL SS (BLADE) ×3 IMPLANT
BOOTCOVER CLEANROOM LRG (PROTECTIVE WEAR) ×4 IMPLANT
BUR 3.5 LG SPHERICAL (BURR) IMPLANT
BUR OVAL 4.0 (BURR) ×3 IMPLANT
BURR 3.5 LG SPHERICAL (BURR)
BURR 3.5MM LG SPHERICAL (BURR)
CANISTER SUCT LVC 12 LTR MEDI- (MISCELLANEOUS) ×3 IMPLANT
CANNULA ACUFLEX KIT 5X76 (CANNULA) ×3 IMPLANT
CANNULA DRILOCK 5.0MMX75MM (CANNULA) ×3
CANNULA DRILOCK 5.0X75 (CANNULA) ×3 IMPLANT
CLOSURE STERI-STRIP 1/2X4 (GAUZE/BANDAGES/DRESSINGS) ×1
CLOSURE WOUND 1/2 X4 (GAUZE/BANDAGES/DRESSINGS) ×1
CLSR STERI-STRIP ANTIMIC 1/2X4 (GAUZE/BANDAGES/DRESSINGS) ×1 IMPLANT
CONNECTOR 5 IN 1 STRAIGHT STRL (MISCELLANEOUS) ×3 IMPLANT
DRAPE INCISE 23X17 IOBAN STRL (DRAPES) ×2
DRAPE INCISE 23X17 STRL (DRAPES) ×1 IMPLANT
DRAPE INCISE IOBAN 23X17 STRL (DRAPES) ×1 IMPLANT
DRAPE INCISE IOBAN 66X45 STRL (DRAPES) ×3 IMPLANT
DRAPE ORTHO SPLIT 77X108 STRL (DRAPES) ×6
DRAPE STERI 35X30 U-POUCH (DRAPES) ×3 IMPLANT
DRAPE SURG 17X11 SM STRL (DRAPES) ×3 IMPLANT
DRAPE SURG ORHT 6 SPLT 77X108 (DRAPES) ×2 IMPLANT
DRAPE U-SHAPE 47X51 STRL (DRAPES) ×3 IMPLANT
DRSG PAD ABDOMINAL 8X10 ST (GAUZE/BANDAGES/DRESSINGS) ×4 IMPLANT
DURAPREP 26ML APPLICATOR (WOUND CARE) ×4 IMPLANT
ELECT REM PT RETURN 9FT ADLT (ELECTROSURGICAL) ×3
ELECTRODE REM PT RTRN 9FT ADLT (ELECTROSURGICAL) ×1 IMPLANT
GAUZE SPONGE 4X4 12PLY STRL (GAUZE/BANDAGES/DRESSINGS) ×3 IMPLANT
GLOVE BIO SURGEON STRL SZ7.5 (GLOVE) ×3 IMPLANT
GLOVE BIO SURGEON STRL SZ8 (GLOVE) ×3 IMPLANT
GLOVE EUDERMIC 7 POWDERFREE (GLOVE) ×3 IMPLANT
GLOVE SS BIOGEL STRL SZ 7.5 (GLOVE) ×1 IMPLANT
GLOVE SUPERSENSE BIOGEL SZ 7.5 (GLOVE) ×2
GOWN STRL REUS W/ TWL LRG LVL3 (GOWN DISPOSABLE) ×1 IMPLANT
GOWN STRL REUS W/ TWL XL LVL3 (GOWN DISPOSABLE) ×4 IMPLANT
GOWN STRL REUS W/TWL LRG LVL3 (GOWN DISPOSABLE) ×6
GOWN STRL REUS W/TWL XL LVL3 (GOWN DISPOSABLE) ×3
KIT BASIN OR (CUSTOM PROCEDURE TRAY) ×3 IMPLANT
KIT ROOM TURNOVER OR (KITS) ×3 IMPLANT
KIT SHOULDER TRACTION (DRAPES) ×3 IMPLANT
MANIFOLD NEPTUNE II (INSTRUMENTS) ×3 IMPLANT
NDL SPNL 18GX3.5 QUINCKE PK (NEEDLE) ×1 IMPLANT
NDL SUT 6 .5 CRC .975X.05 MAYO (NEEDLE) IMPLANT
NEEDLE MAYO TAPER (NEEDLE)
NEEDLE SPNL 18GX3.5 QUINCKE PK (NEEDLE) ×3 IMPLANT
NS IRRIG 1000ML POUR BTL (IV SOLUTION) ×3 IMPLANT
PACK SHOULDER (CUSTOM PROCEDURE TRAY) ×3 IMPLANT
PAD ARMBOARD 7.5X6 YLW CONV (MISCELLANEOUS) ×6 IMPLANT
SET ARTHROSCOPY TUBING (MISCELLANEOUS) ×3
SET ARTHROSCOPY TUBING LN (MISCELLANEOUS) ×1 IMPLANT
SLING ARM FOAM STRAP LRG (SOFTGOODS) ×2 IMPLANT
SLING ARM LRG ADULT FOAM STRAP (SOFTGOODS) IMPLANT
SLING ARM MED ADULT FOAM STRAP (SOFTGOODS) ×1 IMPLANT
SPONGE GAUZE 4X4 12PLY STER LF (GAUZE/BANDAGES/DRESSINGS) ×2 IMPLANT
SPONGE LAP 4X18 X RAY DECT (DISPOSABLE) ×3 IMPLANT
STRIP CLOSURE SKIN 1/2X4 (GAUZE/BANDAGES/DRESSINGS) ×2 IMPLANT
SUT MNCRL AB 3-0 PS2 18 (SUTURE) ×3 IMPLANT
SUT PDS AB 0 CT 36 (SUTURE) IMPLANT
SUT RETRIEVER GRASP 30 DEG (SUTURE) ×2 IMPLANT
SYR 20CC LL (SYRINGE) ×3 IMPLANT
TAPE PAPER 3X10 WHT MICROPORE (GAUZE/BANDAGES/DRESSINGS) ×3 IMPLANT
TOWEL OR 17X24 6PK STRL BLUE (TOWEL DISPOSABLE) ×3 IMPLANT
TOWEL OR 17X26 10 PK STRL BLUE (TOWEL DISPOSABLE) ×3 IMPLANT
WAND SUCTION MAX 4MM 90S (SURGICAL WAND) ×3 IMPLANT
WATER STERILE IRR 1000ML POUR (IV SOLUTION) ×1 IMPLANT

## 2013-12-27 NOTE — Transfer of Care (Signed)
Immediate Anesthesia Transfer of Care Note  Patient: Vincent Sharp  Procedure(s) Performed: Procedure(s): RIGHT SHOULDER ARTHROSCOPY SUBACROMIAL DECOMPRESSION DISTAL CLAVICLE RESECTION AND ROTATOR CUFF REPAIR, BISCEPS TENOTOMY (Right)  Patient Location: PACU  Anesthesia Type:General  Level of Consciousness: awake, alert  and oriented  Airway & Oxygen Therapy: Patient Spontanous Breathing and Patient connected to face mask oxygen  Post-op Assessment: Report given to PACU RN, Post -op Vital signs reviewed and stable and Patient moving all extremities  Post vital signs: Reviewed and stable  Complications: No apparent anesthesia complications

## 2013-12-27 NOTE — Progress Notes (Signed)
Return call from Dr. Onnie Graham. OK to reinforced dressing.

## 2013-12-27 NOTE — Op Note (Signed)
NAME:  Vincent Sharp, Vincent Sharp NO.:  [id number]  MEDICAL RECORD NO.:  [id number]  LOCATION:  [id number]                        FACILITY:  [facility]  PHYSICIAN:  Metta Clines. Katniss Weedman, M.D.  DATE OF BIRTH:  24-Nov-1932  DATE OF PROCEDURE:  12/27/2013 DATE OF DISCHARGE:                              OPERATIVE REPORT   PREOPERATIVE DIAGNOSIS: 1. Massive right shoulder rotator cuff tear. 2. Right shoulder AC joint arthrosis. 3. Right shoulder impingement.  POSTOPERATIVE DIAGNOSIS: 1. Massive right shoulder rotator cuff tear. 2. Right shoulder AC joint arthrosis. 3. Right shoulder impingement. 4. Right shoulder long head biceps tendon tearing.  PROCEDURE: 1. Right shoulder examination under anesthesia. 2. Right shoulder glenoid joint diagnostic arthroscopy. 3. Biceps tendon tenotomy. 4. Arthroscopic subacromial decompression and bursectomy. 5. Arthroscopic distal clavicle resection. 6. Arthroscopic rotator cuff repair of massive tear using a double row     SutureBridge repair construct.  SURGEON:  Metta Clines. Damar Petit, M.D.  Terrence DupontOlivia Mackie A. Shuford, P.A.-C.  ANESTHESIA:  General endotracheal as well as an interscalene block.  ESTIMATED BLOOD LOSS:  Minimal.  DRAINS:  None.  HISTORY:  Vincent Sharp is a 80 year old gentleman who fell injuring his right shoulder with immediate complaints of pain, weakness, and inability to elevate the arm.  Examination in the office showed profound weakness with an MRI scan confirming a massive tear of the rotator cuff involving the majority of the supra and infraspinatus.  He is brought to the operating room at this time for planned right shoulder arthroscopy with attempted rotator repair as described below.  Preoperatively, I counseled Vincent Sharp regarding treatment options and risks versus benefits thereof.  Possible surgical complications were all reviewed including potential for bleeding, infection, neurovascular injury, persistent pain,  loss of motion, anesthetic complication, failure of healing, recurrence of rotator cuff tear, possible need for further surgery.  He understands and accepts and agrees with our planned procedure.  PROCEDURE IN DETAIL:  After undergoing routine preop evaluation, the patient received prophylactic antibiotics and an interscalene block was established in the holding area by the Anesthesia Department.  Placed supine on operative table, underwent smooth induction of a general endotracheal anesthesia.  Turned to the left lateral decubitus position on a beanbag and appropriately padded and protected.  Right shoulder examination under anesthesia revealed full motion and no instability patterns were noted.  Right arm suspended at 70 degrees abduction with 15 pounds of traction.  The right shoulder girdle region was sterilely prepped and draped in standard fashion.  Time-out was called.  A posterior portal was established in the humeral joint.  Portal was established under direct visualization.  Articular surfaces were in good condition.  There was degenerative tearing of the superior labrum confluent with a significant tear at the base of the biceps dividing approximately 50% across the biceps tendon just proximal to the depth of the proximal insertion.  Given the degree of attenuation, fraying and tearing, we went ahead and performed a biceps tenotomy with the Stryker wand and then used a shaver to debride the remaining damaged portions of the superior labrum.  No instability patterns were noted.  There was an obvious full-thickness defect of the supra and infraspinatus.  At this point, fluid and instruments were then removed.  The arm was dropped down to 30 degrees of abduction with the arthroscope introduced in the subacromial space to the posterior portal and a direct lateral portal was established in subacromial space.  Abundant dense bursal tissue and multiple adhesions were countered, and  these were all divided and excised with a combination of shaver and Stryker wand.  The wand was then used to remove the periosteum from the undersurface of the anterior half of the acromion and subacromial decompression was performed with a bur creating a type 1 morphology.  Portal was then established directly anterior to the distal clavicle and a distal clavicle resection was performed with a bur.  Care was taken to confirm visualization of entire circumference of the distal clavicle to ensure adequate removal of bone. We then completed the subacromial/subdeltoid bursectomy.  The rotator cuff tear was readily identified.  The rotator cuff tear was debrided back to healthy tissue along the margin of the tear and the greater tuberosity, removing soft tissue and abraded the bone to bleeding bed and the tear was almost 5 cm across in width.  An accessory portal device was established and through a stab wound, lateral margin of acromion was placed 2 Arthrex PEEK corkscrew suture anchors.  The limbs of the suture anchor were then shuttled through the free margin of the rotator cuff equidistance distant across with a horizontal mattress pattern and then tied with sliding locking knots followed by multiple overhand throws and alternating posts.  We then created "suture bridge" with 2 swivel lock suture anchors, which compressed the free margin of the rotator cuff against the bony bed of tuberosity and overall construct was much to our satisfaction.  Suture limbs were all clipped. Final hemostasis was obtained.  Fluid and instruments removed.  The portals were closed with Monocryl and Steri-Strips.  Dry dressing applied to the right shoulder.  Right arm was placed in a sling, and the patient was awakened, extubated, and taken to recovery room in stable condition.  Vincent Loges, PA-C, was used as an Environmental consultant throughout this case, essential for help with positioning the patient, positioning  the extremity, management of arthroscopic equipment, tissue manipulation, suture management, wound closer, and intraoperative decision making.     Metta Clines. Cambell Stanek, M.D.     KMS/MEDQ  D:  12/27/2013  T:  12/27/2013  Job:  [id number]

## 2013-12-27 NOTE — Addendum Note (Signed)
Addendum  created 12/27/13 1758 by Leonor Liv, CRNA   Modules edited: Charges VN

## 2013-12-27 NOTE — H&P (Signed)
Vincent Sharp    Chief Complaint: RIGHT SHOULDER ROTATOR CUFF TEAR  HPI: The patient is a 80 y.o. male with massive right shoulder rotator cuff tear  Past Medical History  Diagnosis Date  . Hypertension   . Hyperlipidemia   . Depression   . Cancer     skin, colon  . OSA on CPAP   . Obesity hypoventilation syndrome   . Complex sleep apnea syndrome 05/18/2013  . Coronary artery disease   . Pneumonia     as a child    Past Surgical History  Procedure Laterality Date  . Polypectomy  1989  . Testicle surgery  2005  . Cystectomy  1960    spine  . Cataract extraction Bilateral   . Coronary artery bypass graft  1992    x3, Dr Redmond Pulling surgeon,  Dr Wynonia Lawman cardiologist  . Tonsillectomy    . Eye surgery    . Hernia repair      right inguinal  . Knee arthroscopy w/ meniscal repair Right     Family History  Problem Relation Age of Onset  . Heart disease Mother     Social History:  reports that he has never smoked. He has never used smokeless tobacco. He reports that he does not drink alcohol or use illicit drugs.  Allergies: No Known Allergies  Medications Prior to Admission  Medication Sig Dispense Refill  . acetaminophen (TYLENOL) 500 MG tablet Take 1,000 mg by mouth every 8 (eight) hours as needed (for pain).    Marland Kitchen aspirin 81 MG tablet Take 81 mg by mouth daily.    Marland Kitchen atorvastatin (LIPITOR) 20 MG tablet Take 20-40 mg by mouth daily. Take 40 mg every mon, wed, and Friday and 20 mg all other days    . diazepam (VALIUM) 5 MG tablet Take 5 mg by mouth every 8 (eight) hours as needed for anxiety.   0  . Multiple Vitamin (MULTI-VITAMIN PO) Take 1 tablet by mouth daily.     . nebivolol (BYSTOLIC) 10 MG tablet Take 10 mg by mouth daily.    . Omega-3 Fatty Acids (FISH OIL) 300 MG CAPS Take 1 capsule by mouth 2 (two) times daily.     Marland Kitchen oxyCODONE-acetaminophen (PERCOCET/ROXICET) 5-325 MG per tablet Take 1 tablet by mouth every 8 (eight) hours as needed for severe pain (pain).    .  promethazine (PHENERGAN) 25 MG tablet Take 12.5 mg by mouth every 8 (eight) hours as needed for nausea or vomiting.    . ramipril (ALTACE) 10 MG capsule Take 10 mg by mouth daily.       Physical Exam: right shoulder with severe weakness and loss of motion and positive drop arm.  Vitals  Temp:  [97.8 F (36.6 C)] 97.8 F (36.6 C) (11/05 1344) Pulse Rate:  [64-82] 64 (11/05 1344) Resp:  [14-16] 14 (11/05 1344) BP: (132-138)/(46-54) 133/46 mmHg (11/05 1344) SpO2:  [95 %-96 %] 96 % (11/05 1344) Weight:  [115.531 kg (254 lb 11.2 oz)] 115.531 kg (254 lb 11.2 oz) (11/05 1343)  Assessment/Plan  Impression: RIGHT SHOULDER ROTATOR CUFF TEAR   Plan of Action: Procedure(s): RIGHT SHOULDER ARTHROSCOPY SUBACROMIAL DECOMPRESSION DISTAL CLAVICLE RESECTION AND ROTATOR CUFF REPAIR   [name] 12/27/2013, 2:09 PM

## 2013-12-27 NOTE — Anesthesia Procedure Notes (Addendum)
Anesthesia Regional Block:  Supraclavicular block  Pre-Anesthetic Checklist: ,, timeout performed, Correct Patient, Correct Site, Correct Laterality, Correct Procedure, Correct Position, site marked, Risks and benefits discussed,  Surgical consent,  Pre-op evaluation,  At surgeon's request and post-op pain management  Laterality: Right  Prep: chloraprep       Needles:   Needle Type: Echogenic Stimulator Needle     Needle Length: 9cm 9 cm Needle Gauge: 22 and 22 G    Additional Needles:  Procedures: ultrasound guided (picture in chart) and nerve stimulator Supraclavicular block  Nerve Stimulator or Paresthesia:  Response: 0.6 mA,   Additional Responses:   Narrative:  Start time: 12/27/2013 1:15 PM End time: 12/27/2013 1:29 PM Injection made incrementally with aspirations every 5 mL.  Additional Notes: R supra- clavicular block, [place], Stimulator, marcaine .5% 22cc, with epi, multiple asp, talked to patient throughout procedure, sterile technique, no complications   Procedure Name: Intubation Date/Time: 12/27/2013 2:52 PM Performed by: Trixie Deis A Pre-anesthesia Checklist: Patient identified, Timeout performed, Emergency Drugs available, Suction available and Patient being monitored Patient Re-evaluated:Patient Re-evaluated prior to inductionOxygen Delivery Method: Circle system utilized Preoxygenation: Pre-oxygenation with 100% oxygen Intubation Type: IV induction Ventilation: Mask ventilation without difficulty and Oral airway inserted - appropriate to patient size Laryngoscope Size: Mac and 3 Grade View: Grade IV Tube size: 7.5 mm Number of attempts: 2 (DLx1 CRNA grade IV view. DLx1 MDA grade IV view blind intubation) Airway Equipment and Method: Stylet Placement Confirmation: ETT inserted through vocal cords under direct vision,  breath sounds checked- equal and bilateral and positive ETCO2 Secured at: 23 cm Tube secured with: Tape Difficulty Due To: Difficult  Airway- due to reduced neck mobility and Difficult Airway- due to anterior larynx Future Recommendations: Recommend- induction with short-acting agent, and alternative techniques readily available Comments: glidescope recommended for future intubations

## 2013-12-27 NOTE — Anesthesia Preprocedure Evaluation (Addendum)
Anesthesia Evaluation  Patient identified by MRN, date of birth, ID band Patient awake    Reviewed: Allergy & Precautions, H&P , NPO status , Patient's Chart, lab work & pertinent test results  Airway Mallampati: II   Neck ROM: Full    Dental  (+) Partial Upper, Dental Advisory Given, Partial Lower   Pulmonary sleep apnea and Continuous Positive Airway Pressure Ventilation ,  breath sounds clear to auscultation        Cardiovascular hypertension, Pt. on medications + CAD Rhythm:Regular  SP ACB, based on Alisyn evaluation she recommends cardiac clearence, Dr.Smith agrees, need to check if this was done   Neuro/Psych    GI/Hepatic   Endo/Other    Renal/GU      Musculoskeletal   Abdominal (+) + obese,   Peds  Hematology   Anesthesia Other Findings   Reproductive/Obstetrics                           Anesthesia Physical Anesthesia Plan  ASA: III  Anesthesia Plan: General   Post-op Pain Management:    Induction: Intravenous  Airway Management Planned: Oral ETT  Additional Equipment:   Intra-op Plan:   Post-operative Plan: Extubation in OR  Informed Consent: I have reviewed the patients History and Physical, chart, labs and discussed the procedure including the risks, benefits and alternatives for the proposed anesthesia with the patient or authorized representative who has indicated his/her understanding and acceptance.     Plan Discussed with:   Anesthesia Plan Comments: (Verify resent cardiac clearance)       Anesthesia Quick Evaluation

## 2013-12-27 NOTE — Anesthesia Postprocedure Evaluation (Signed)
  Anesthesia Post-op Note  Patient: Vincent Sharp  Procedure(s) Performed: Procedure(s): RIGHT SHOULDER ARTHROSCOPY SUBACROMIAL DECOMPRESSION DISTAL CLAVICLE RESECTION AND ROTATOR CUFF REPAIR, BISCEPS TENOTOMY (Right)  Patient Location: PACU  Anesthesia Type:General  Level of Consciousness: awake  Airway and Oxygen Therapy: Patient Spontanous Breathing  Post-op Pain: mild  Post-op Assessment: Post-op Vital signs reviewed  Post-op Vital Signs: Reviewed  Last Vitals:  Filed Vitals:   12/27/13 1730  BP:   Pulse: 60  Temp:   Resp: 16    Complications: No apparent anesthesia complications

## 2013-12-27 NOTE — Op Note (Signed)
12/27/2013  4:37 PM  PATIENT:   Clemmie Krill  80 y.o. male  PRE-OPERATIVE DIAGNOSIS:  RIGHT SHOULDER ROTATOR CUFF TEAR , AC JOINT OA, IMPINGEMENT  POST-OPERATIVE DIAGNOSIS:  SAME WITH BICEP TENDON TEARING  PROCEDURE:  RSA, bicep tenotomy, SAD, DCR, RCR  SURGEON:  Aradhana Gin, Metta Clines. M.D.  ASSISTANTS: Shuford pac   ANESTHESIA:   GET + ISB  EBL: min  SPECIMEN:  none  Drains: none   PATIENT DISPOSITION:  PACU - hemodynamically stable.    PLAN OF CARE: Admit for overnight observation  Dictation# [id number]???

## 2013-12-28 ENCOUNTER — Encounter (HOSPITAL_COMMUNITY): Payer: Self-pay | Admitting: Orthopedic Surgery

## 2013-12-28 DIAGNOSIS — S46011A Strain of muscle(s) and tendon(s) of the rotator cuff of right shoulder, initial encounter: Secondary | ICD-10-CM | POA: Diagnosis not present

## 2013-12-28 MED ORDER — DIAZEPAM 5 MG PO TABS: 2.5000 mg | ORAL_TABLET | Freq: Four times a day (QID) | ORAL | Status: DC | PRN

## 2013-12-28 MED ORDER — OXYCODONE-ACETAMINOPHEN 5-325 MG PO TABS: 1.0000 | ORAL_TABLET | ORAL | Status: DC | PRN

## 2013-12-28 NOTE — Discharge Summary (Signed)
PATIENT ID:      Vincent Sharp  MRN:     [id number] DOB/AGE:    November 03, 1932 / 80 y.o.     DISCHARGE SUMMARY  ADMISSION DATE:    12/27/2013 DISCHARGE DATE:    ADMISSION DIAGNOSIS: RIGHT SHOULDER ROTATOR CUFF TEAR  Past Medical History  Diagnosis Date  . Hypertension   . Hyperlipidemia   . Depression   . Cancer     skin, colon  . OSA on CPAP   . Obesity hypoventilation syndrome   . Complex sleep apnea syndrome 05/18/2013  . Coronary artery disease   . Pneumonia     as a child    DISCHARGE DIAGNOSIS:   Principal Problem:   Traumatic tear of right rotator cuff Active Problems:   CAD (coronary artery disease), native coronary artery   Hyperlipidemia   S/P CABG (coronary artery bypass graft)   Traumatic rotator cuff tear   PROCEDURE: Procedure(s): RIGHT SHOULDER ARTHROSCOPY SUBACROMIAL DECOMPRESSION DISTAL CLAVICLE RESECTION AND ROTATOR CUFF REPAIR, BISCEPS TENOTOMY on 12/27/2013  CONSULTS:     HISTORY:  See H&P in chart.  HOSPITAL COURSE:  Vincent Sharp is a 80 y.o. admitted on 12/27/2013 with a chief complaint of right shoulder pain following a mechanical fall, and found to have a diagnosis of Vincent Sharp .  They were brought to the operating room on 12/27/2013 and underwent Procedure(s): RIGHT SHOULDER ARTHROSCOPY SUBACROMIAL DECOMPRESSION DISTAL CLAVICLE RESECTION AND ROTATOR CUFF REPAIR, BISCEPS TENOTOMY.    They were given perioperative antibiotics: Anti-infectives    Start     Dose/Rate Route Frequency Ordered Stop   12/27/13 0600  ceFAZolin (ANCEF) IVPB 2 g/50 mL premix     2 g100 mL/hr over 30 Minutes Intravenous On call to O.R. 12/26/13 1347 12/27/13 1443    .  Patient underwent the above named procedure and tolerated it well. He was kept for overnight observation because of his cardiac history and sleep apnea. He did well overnight The following day they were hemodynamically stable and pain was controlled on oral analgesics. They were  neurovascularly intact to the operative extremity. OT was ordered and worked with patient per protocol. They were medically and orthopaedically stable for discharge on day 1    DIAGNOSTIC STUDIES:  RECENT RADIOGRAPHIC STUDIES :  Dg Chest 2 View  12/24/2013   CLINICAL DATA:  80 year old male preoperative study for right shoulder surgery. Initial encounter. Current history of hypertension, heart disease.  EXAM: CHEST  2 VIEW  COMPARISON:  09/23/2010.  FINDINGS: Stable lung volumes. Stable cardiomegaly and mediastinal contours. Sequelae of CABG. No pneumothorax or pulmonary edema. No pleural effusion or confluent pulmonary opacity. Calcified atherosclerosis of the aorta. Stable visualized osseous structures. Visualized tracheal air column is within normal limits.  IMPRESSION: No acute cardiopulmonary abnormality.   Electronically Signed   By: Lars Pinks M.D.   On: 12/24/2013 10:59   Dg Shoulder Right  12/10/2013   CLINICAL DATA:  Fall 4 days previous with persistent shoulder pain  EXAM: RIGHT SHOULDER - 2+ VIEW  COMPARISON:  None.  FINDINGS: Degenerative changes of the acromioclavicular joint are seen. No acute fracture or dislocation is noted. The underlying bony thorax is unremarkable.  IMPRESSION: Degenerative change without acute abnormality.   Electronically Signed   By: Inez Catalina M.D.   On: 12/10/2013 16:57    RECENT VITAL SIGNS:  Patient Vitals for the past 24 hrs:  BP Temp Temp src Pulse Resp SpO2 Height Weight  12/28/13  0518 133/66 mmHg 97.9 F (36.6 C) Oral 69 17 94 % - -  12/28/13 0214 124/60 mmHg 97.3 F (36.3 C) Oral 72 18 92 % - -  12/27/13 2151 (!) 164/87 mmHg - - - - - - -  12/27/13 2150 (!) 164/87 mmHg 97.5 F (36.4 C) Oral 69 17 96 % - -  12/27/13 1945 (!) 142/69 mmHg 97.3 F (36.3 C) Oral 67 16 99 % - -  12/27/13 1907 (!) 145/73 mmHg 97.7 F (36.5 C) - 65 12 93 % - -  12/27/13 1900 - - - 60 12 92 % - -  12/27/13 1850 139/62 mmHg - - (!) 58 13 93 % - -  12/27/13 1845 - -  - (!) 57 16 96 % - -  12/27/13 1844 - - - 64 18 99 % - -  12/27/13 1842 - - - 63 13 98 % - -  12/27/13 1832 (!) 145/72 mmHg - - 65 (!) 22 98 % - -  12/27/13 1830 - - - 67 (!) 23 97 % - -  12/27/13 1818 118/66 mmHg - - (!) 58 16 97 % - -  12/27/13 1815 - - - 68 17 97 % - -  12/27/13 1802 119/69 mmHg - - 64 12 96 % - -  12/27/13 1800 - - - (!) 58 13 94 % - -  12/27/13 1745 - - - 61 15 98 % - -  12/27/13 1733 135/68 mmHg - - (!) 58 15 96 % - -  12/27/13 1730 - - - 60 16 100 % - -  12/27/13 1718 127/69 mmHg - - 62 18 97 % - -  12/27/13 1715 - - - 60 16 100 % - -  12/27/13 1703 131/64 mmHg - - 64 18 96 % - -  12/27/13 1659 131/64 mmHg 97 F (36.1 C) - 65 16 96 % - -  12/27/13 1344 (!) 133/46 mmHg 97.8 F (36.6 C) Oral 64 14 96 % - -  12/27/13 1343 (!) 132/49 mmHg - - 77 16 95 % 6' (1.829 m) 115.531 kg (254 lb 11.2 oz)  12/27/13 1338 (!) 138/54 mmHg - - 82 16 95 % - -  .  RECENT EKG RESULTS:    Orders placed or performed during the hospital encounter of 12/24/13  . EKG 12 lead  . EKG 12 lead  . EKG 12-Lead  . EKG 12-Lead    DISCHARGE INSTRUCTIONS:    DISCHARGE MEDICATIONS:     Medication List    TAKE these medications        acetaminophen 500 MG tablet  Commonly known as:  TYLENOL  Take 1,000 mg by mouth every 8 (eight) hours as needed (for pain).     aspirin 81 MG tablet  Take 81 mg by mouth daily.     atorvastatin 20 MG tablet  Commonly known as:  LIPITOR  Take 20-40 mg by mouth daily. Take 40 mg every mon, wed, and Friday and 20 mg all other days     diazepam 5 MG tablet  Commonly known as:  VALIUM  Take 5 mg by mouth every 8 (eight) hours as needed for anxiety.     diazepam 5 MG tablet  Commonly known as:  VALIUM  Take 0.5-1 tablets (2.5-5 mg total) by mouth every 6 (six) hours as needed for muscle spasms or sedation.     Fish Oil 300 MG Caps  Take 1 capsule by mouth 2 (  two) times daily.     MULTI-VITAMIN PO  Take 1 tablet by mouth daily.     nebivolol  10 MG tablet  Commonly known as:  BYSTOLIC  Take 10 mg by mouth daily.     oxyCODONE-acetaminophen 5-325 MG per tablet  Commonly known as:  PERCOCET/ROXICET  Take 1 tablet by mouth every 8 (eight) hours as needed for severe pain (pain).     oxyCODONE-acetaminophen 5-325 MG per tablet  Commonly known as:  PERCOCET  Take 1-2 tablets by mouth every 4 (four) hours as needed.     promethazine 25 MG tablet  Commonly known as:  PHENERGAN  Take 12.5 mg by mouth every 8 (eight) hours as needed for nausea or vomiting.     ramipril 10 MG capsule  Commonly known as:  ALTACE  Take 10 mg by mouth daily.        FOLLOW UP VISIT:       Follow-up Information    Follow up with Marin Shutter, MD.   Specialty:  Orthopedic Surgery   Why:  call to be seen in 7-10 days   Contact information:   [address] [place] [address] [phone]       DISCHARGE TO: home  DISPOSITION: Good  DISCHARGE CONDITION:  Festus Barren for Dr. Justice Britain 12/28/2013, 8:07 AM

## 2013-12-28 NOTE — Progress Notes (Signed)
Pt. Refused cpap for tonight. 

## 2013-12-28 NOTE — Progress Notes (Signed)
Occupational Therapy Evaluation and Discharge Patient Details Name: Vincent Sharp MRN: [id number] DOB: 1932-04-18 Today's Date: 12/28/2013    History of Present Illness Vincent Sharp. Ahlgren is a 80 y.o. Male s/p Right shoulder arthroscopy with rotator cuff repair and subacromial decompression on 12/27/13.    Clinical Impression   PTA pt lived at home with his spouse and was independent with ADLs, however had difficulty due to limited ROM of RUE. Pt now limited by pain and shoulder precautions. Pt's daughters present for education and training and demonstrated independence with assisting pt; they will provide 24/7 assistance. All education and training completed for shoulder precautions and therapeutic exercise. No further acute OT needed. Pt ready for d/c from OT standpoint.     Follow Up Recommendations  No OT follow up;Supervision/Assistance - 24 hour    Equipment Recommendations  None recommended by OT    Recommendations for Other Services       Precautions / Restrictions Precautions Precautions: Shoulder;Fall Type of Shoulder Precautions: No ROM of shoulder; elbow,wrist,hand ROM ONLY. Pendulums Shoulder Interventions: Shoulder sling/immobilizer Precaution Booklet Issued: Yes (comment) Precaution Comments: Educated pt and his daughters on shoulder precautions and incorporating them into ADLs. Recommended sling at all times due to pt's difficulty with maintaining shoulder precautions.  Required Braces or Orthoses: Sling Restrictions Weight Bearing Restrictions: Yes RUE Weight Bearing: Non weight bearing      Mobility Bed Mobility Overal bed mobility: Needs Assistance Bed Mobility: Supine to Sit     Supine to sit: Min assist     General bed mobility comments: Min (A) to elevate trunk off bed without use of RUE. Provided hand held assist and demo'd to daughters how to help. Recommended that pt sleep in his recliner until improved mobility.   Transfers Overall transfer  level: Needs assistance Equipment used: None;1 person hand held assist Transfers: Sit to/from Stand Sit to Stand: Min assist;Min guard         General transfer comment: initially required min (A) with hand held assist, but progressed to min guard with practice. VC's to use LUE only. Advised that pt have sling on at all times.         ADL Overall ADL's : Needs assistance/impaired Eating/Feeding: Set up;Sitting Eating/Feeding Details (indicate cue type and reason): pt requires assist to open containers and cut food Grooming: Oral care;Set up;Sitting   Upper Body Bathing: Minimal assitance;Sitting;With caregiver independent assisting   Lower Body Bathing: Minimal assistance;Sit to/from stand;With caregiver independent assisting   Upper Body Dressing : Maximal assistance;Sitting;With caregiver independent assisting Upper Body Dressing Details (indicate cue type and reason): including sling Lower Body Dressing: Moderate assistance;Sit to/from stand;With caregiver independent assisting   Toilet Transfer: Min guard;Ambulation             General ADL Comments: When OT entered, pt in his bed however pt's daughter relayed that he was up alone in his room however he did not fall. Reinforced not getting up without staff present and recommended that someone be with him at all times when he returns home to ensure safety. Pt c/o dizziness when first standing up and performed short ambulation to recliner chair. Pt and daughters were educated fully on incorporating shoulder precautions into ADLs and daughters demonstrated independence with assisting pt with his ADLs. Pt also performed therapeutic exercises with Supervision. Pt requires VC's to maintain shoulder precautions.       Vision  Pt reports no change from baseline.  Perception Perception Perception Tested?: No   Praxis Praxis Praxis tested?: Within functional limits    Pertinent Vitals/Pain Pain  Assessment: 0-10 Pain Score: 4  Pain Location: Right shoulder Pain Descriptors / Indicators: Aching Pain Intervention(s): Limited activity within patient's tolerance;Monitored during session;Repositioned;Ice applied;Premedicated before session     Hand Dominance Right   Extremity/Trunk Assessment Upper Extremity Assessment Upper Extremity Assessment: RUE deficits/detail RUE Deficits / Details: Pt s/p Rt shoulder arthroscopy and no ROM of shoulder allowed. Elbow, wrist, hand all WFL.  RUE: Unable to fully assess due to pain;Unable to fully assess due to immobilization RUE Coordination: decreased gross motor   Lower Extremity Assessment Lower Extremity Assessment: Overall WFL for tasks assessed   Cervical / Trunk Assessment Cervical / Trunk Assessment: Normal   Communication Communication Communication: No difficulties   Cognition Arousal/Alertness: Awake/alert Behavior During Therapy: WFL for tasks assessed/performed Overall Cognitive Status: Within Functional Limits for tasks assessed       Memory: Decreased recall of precautions                Exercises Exercises: Shoulder     Shoulder Instructions Shoulder Instructions Donning/doffing shirt without moving shoulder: Maximal assistance;Caregiver independent with task Method for sponge bathing under operated UE: Minimal assistance;Caregiver independent with task Donning/doffing sling/immobilizer: Maximal assistance;Caregiver independent with task Correct positioning of sling/immobilizer: Maximal assistance;Caregiver independent with task Pendulum exercises (written home exercise program): Min-guard (recommended doing in sitting) ROM for elbow, wrist and digits of operated UE: Supervision/safety Sling wearing schedule (on at all times/off for ADL's): Supervision/safety Proper positioning of operated UE when showering: Supervision/safety Positioning of UE while sleeping: Supervision/safety    Home Living  Family/patient expects to be discharged to:: Private residence Living Arrangements: Spouse/significant other;Children Available Help at Discharge: Family;Available 24 hours/day (pt's daughters will be assisting 24/7) Type of Home: House       Home Layout: One level     Bathroom Shower/Tub: Occupational psychologist: Standard     Home Equipment: Shower seat          Prior Functioning/Environment Level of Independence: Independent        Comments: Independent, but ADLs were difficult due to limited ROM of his right shoulder    OT Diagnosis: Generalized weakness;Acute pain                          End of Session Equipment Utilized During Treatment: Other (comment) (sling) Nurse Communication: Other (comment) (pt ready for d/c from OT standpoint)  Activity Tolerance: Patient tolerated treatment well Patient left: in chair;with call bell/phone within reach;with family/visitor present   Time: 3500-9381 OT Time Calculation (min): 36 min Charges:  OT General Charges $OT Visit: 1 Procedure OT Evaluation $Initial OT Evaluation Tier I: 1 Procedure OT Treatments $Self Care/Home Management : 8-22 mins $Therapeutic Exercise: 8-22 mins G-Codes: OT G-codes **NOT FOR INPATIENT CLASS** Functional Assessment Tool Used: clinical judgement Functional Limitation: Self care Self Care Current Status (W2993): At least 20 percent but less than 40 percent impaired, limited or restricted Self Care Goal Status (Z1696): At least 20 percent but less than 40 percent impaired, limited or restricted Self Care Discharge Status [phone]): At least 20 percent but less than 40 percent impaired, limited or restricted  Juluis Rainier 12/28/2013, 11:57 AM   Cyndie Chime, OTR/L Occupational Therapist [phone] (pager)

## 2013-12-28 NOTE — Progress Notes (Signed)
D/c to home with family. Pain denied. Breathing regular and unlabored on room air. D/c instructions and prescriptions given to family and pat and demonstrated understanding.

## 2013-12-28 NOTE — Discharge Instructions (Signed)
° °  Kevin M. Supple, M.D., F.A.A.O.S. °Orthopaedic Surgery °Specializing in Arthroscopic and Reconstructive °Surgery of the Shoulder and Knee °[phone] °[address] - [place], [place] [address] - Fax [phone] ° °POST-OP SHOULDER ARTHROSCOPIC ROTATOR CUFF  REPAIR INSTRUCTIONS ° °1. Call the office at [phone] to schedule your first post-op appointment 7-10 days from the date of your surgery. ° °2. Leave the steri-strips in place over your incisions when performing dressing changes and showering. You may remove your dressings and begin showering 72 hours from surgery. You can expect drainage that is clear to bloody in nature that occasionally will soak through your dressings. If this occurs go ahead and perform a dressing change. The drainage should lessen daily and when there is no drainage from your incisions feel free to go without a dressing. ° °3. Wear your sling/immobilizer at all times except to perform the exercises below or to occasionally let your arm dangle by your side to stretch your elbow. You also need to sleep in your sling immobilizer until instructed otherwise. ° °4. Range of motion to your elbow, wrist, and hand are encouraged 3-5 times daily. Exercise to your hand and fingers helps to reduce swelling you may experience. ° °5. Utilize ice to the shoulder 3-4 times minimum a day and additionally if you are experiencing pain. ° °6. You may one-armed drive when safely off of narcotics and muscle relaxants. You may use your hand that is in the sling to support the steering wheel only. However, should it be your right arm that is in the sling it is not to be used for gear shifting in a manual transmission. ° °7. If you had a block pre-operatively to provide post-op pain relief you may want to go ahead and begin utilizing your pain meds as your arm begins to wake up. Blocks can sometimes last up to 16-18 hours. If you are still pain-free prior to going to bed you may want to  strongly consider taking a pain medication to avoid being awakened in the night with the onset of pain. A muscle relaxant is also provided for you should you experience muscle spasms. It is recommended that if you are experiencing pain that your pain medication alone is not controlling, add the muscle relaxant along with the pain medication which can give additional pain relief. The first one to two days is generally the most severe of your pain and then should gradually decrease. As your pain lessens it is recommended that you decrease your use of the pain medications to an "as needed basis" only and to always comply with the recommended dosages of the pain medications. ° °8. Pain medications can produce constipation along with their use. If you experience this, the use of an over the counter stool softener or laxative daily is recommended.  ° °9. For additional questions or concerns, please do not hesitate to call the office. If after hours there is an answering service to forward your concerns to the physician on call. ° °POST-OP EXERCISES ° °Pendulum Exercises ° °Perform pendulum exercises while standing and bending at the waist. Support your uninvolved arm on a table or chair and allow your operated arm to hang freely. Make sure to do these exercises passively - not using you shoulder muscle. ° °Repeat 20 times. Do 3 sessions per day. ° ° °

## 2014-01-01 DIAGNOSIS — R300 Dysuria: Secondary | ICD-10-CM | POA: Diagnosis not present

## 2014-01-08 DIAGNOSIS — S46011D Strain of muscle(s) and tendon(s) of the rotator cuff of right shoulder, subsequent encounter: Secondary | ICD-10-CM | POA: Diagnosis not present

## 2014-01-09 ENCOUNTER — Encounter: Payer: Self-pay | Admitting: Neurology

## 2014-01-10 DIAGNOSIS — S46011D Strain of muscle(s) and tendon(s) of the rotator cuff of right shoulder, subsequent encounter: Secondary | ICD-10-CM | POA: Diagnosis not present

## 2014-01-14 DIAGNOSIS — S46011D Strain of muscle(s) and tendon(s) of the rotator cuff of right shoulder, subsequent encounter: Secondary | ICD-10-CM | POA: Diagnosis not present

## 2014-01-15 ENCOUNTER — Encounter: Payer: Self-pay | Admitting: Neurology

## 2014-01-16 DIAGNOSIS — S46011D Strain of muscle(s) and tendon(s) of the rotator cuff of right shoulder, subsequent encounter: Secondary | ICD-10-CM | POA: Diagnosis not present

## 2014-01-22 DIAGNOSIS — S46011D Strain of muscle(s) and tendon(s) of the rotator cuff of right shoulder, subsequent encounter: Secondary | ICD-10-CM | POA: Diagnosis not present

## 2014-01-24 DIAGNOSIS — S46011D Strain of muscle(s) and tendon(s) of the rotator cuff of right shoulder, subsequent encounter: Secondary | ICD-10-CM | POA: Diagnosis not present

## 2014-01-29 DIAGNOSIS — S46011D Strain of muscle(s) and tendon(s) of the rotator cuff of right shoulder, subsequent encounter: Secondary | ICD-10-CM | POA: Diagnosis not present

## 2014-01-31 DIAGNOSIS — S46011D Strain of muscle(s) and tendon(s) of the rotator cuff of right shoulder, subsequent encounter: Secondary | ICD-10-CM | POA: Diagnosis not present

## 2014-02-05 DIAGNOSIS — S46011D Strain of muscle(s) and tendon(s) of the rotator cuff of right shoulder, subsequent encounter: Secondary | ICD-10-CM | POA: Diagnosis not present

## 2014-02-08 DIAGNOSIS — S46011D Strain of muscle(s) and tendon(s) of the rotator cuff of right shoulder, subsequent encounter: Secondary | ICD-10-CM | POA: Diagnosis not present

## 2014-02-11 DIAGNOSIS — S46011D Strain of muscle(s) and tendon(s) of the rotator cuff of right shoulder, subsequent encounter: Secondary | ICD-10-CM | POA: Diagnosis not present

## 2014-02-12 ENCOUNTER — Ambulatory Visit: Payer: Medicare Other | Admitting: Adult Health

## 2014-02-19 DIAGNOSIS — S46011D Strain of muscle(s) and tendon(s) of the rotator cuff of right shoulder, subsequent encounter: Secondary | ICD-10-CM | POA: Diagnosis not present

## 2014-02-21 DIAGNOSIS — S46011D Strain of muscle(s) and tendon(s) of the rotator cuff of right shoulder, subsequent encounter: Secondary | ICD-10-CM | POA: Diagnosis not present

## 2014-03-01 DIAGNOSIS — H40013 Open angle with borderline findings, low risk, bilateral: Secondary | ICD-10-CM | POA: Diagnosis not present

## 2014-03-01 DIAGNOSIS — H1132 Conjunctival hemorrhage, left eye: Secondary | ICD-10-CM | POA: Diagnosis not present

## 2014-03-04 DIAGNOSIS — S46811D Strain of other muscles, fascia and tendons at shoulder and upper arm level, right arm, subsequent encounter: Secondary | ICD-10-CM | POA: Diagnosis not present

## 2014-03-04 DIAGNOSIS — S46011D Strain of muscle(s) and tendon(s) of the rotator cuff of right shoulder, subsequent encounter: Secondary | ICD-10-CM | POA: Diagnosis not present

## 2014-04-03 DIAGNOSIS — S46011D Strain of muscle(s) and tendon(s) of the rotator cuff of right shoulder, subsequent encounter: Secondary | ICD-10-CM | POA: Diagnosis not present

## 2014-04-03 DIAGNOSIS — S46811D Strain of other muscles, fascia and tendons at shoulder and upper arm level, right arm, subsequent encounter: Secondary | ICD-10-CM | POA: Diagnosis not present

## 2014-04-03 DIAGNOSIS — Z4789 Encounter for other orthopedic aftercare: Secondary | ICD-10-CM | POA: Diagnosis not present

## 2014-07-11 ENCOUNTER — Other Ambulatory Visit: Payer: Self-pay | Admitting: Dermatology

## 2014-07-11 DIAGNOSIS — L720 Epidermal cyst: Secondary | ICD-10-CM | POA: Diagnosis not present

## 2014-07-11 DIAGNOSIS — L570 Actinic keratosis: Secondary | ICD-10-CM | POA: Diagnosis not present

## 2014-07-11 DIAGNOSIS — Z08 Encounter for follow-up examination after completed treatment for malignant neoplasm: Secondary | ICD-10-CM | POA: Diagnosis not present

## 2014-07-11 DIAGNOSIS — C44722 Squamous cell carcinoma of skin of right lower limb, including hip: Secondary | ICD-10-CM | POA: Diagnosis not present

## 2014-07-11 DIAGNOSIS — Z85828 Personal history of other malignant neoplasm of skin: Secondary | ICD-10-CM | POA: Diagnosis not present

## 2014-07-11 DIAGNOSIS — Q828 Other specified congenital malformations of skin: Secondary | ICD-10-CM | POA: Diagnosis not present

## 2014-07-26 DIAGNOSIS — E668 Other obesity: Secondary | ICD-10-CM | POA: Diagnosis not present

## 2014-07-26 DIAGNOSIS — E784 Other hyperlipidemia: Secondary | ICD-10-CM | POA: Diagnosis not present

## 2014-07-26 DIAGNOSIS — I2510 Atherosclerotic heart disease of native coronary artery without angina pectoris: Secondary | ICD-10-CM | POA: Diagnosis not present

## 2014-07-26 DIAGNOSIS — I10 Essential (primary) hypertension: Secondary | ICD-10-CM | POA: Diagnosis not present

## 2014-08-23 ENCOUNTER — Encounter (HOSPITAL_COMMUNITY): Payer: Self-pay

## 2014-08-23 ENCOUNTER — Emergency Department (HOSPITAL_COMMUNITY)
Admission: EM | Admit: 2014-08-23 | Discharge: 2014-08-24 | Disposition: A | Discharge status: Home / Self Care | Payer: Medicare Other | Source: Home / Self Care | Attending: Emergency Medicine | Admitting: Emergency Medicine

## 2014-08-23 ENCOUNTER — Emergency Department (HOSPITAL_COMMUNITY): Payer: Medicare Other

## 2014-08-23 DIAGNOSIS — I10 Essential (primary) hypertension: Secondary | ICD-10-CM | POA: Insufficient documentation

## 2014-08-23 DIAGNOSIS — R410 Disorientation, unspecified: Secondary | ICD-10-CM | POA: Diagnosis not present

## 2014-08-23 DIAGNOSIS — N179 Acute kidney failure, unspecified: Secondary | ICD-10-CM | POA: Diagnosis present

## 2014-08-23 DIAGNOSIS — Z7982 Long term (current) use of aspirin: Secondary | ICD-10-CM | POA: Insufficient documentation

## 2014-08-23 DIAGNOSIS — M4806 Spinal stenosis, lumbar region: Secondary | ICD-10-CM | POA: Diagnosis present

## 2014-08-23 DIAGNOSIS — R42 Dizziness and giddiness: Secondary | ICD-10-CM

## 2014-08-23 DIAGNOSIS — I2510 Atherosclerotic heart disease of native coronary artery without angina pectoris: Secondary | ICD-10-CM

## 2014-08-23 DIAGNOSIS — I480 Paroxysmal atrial fibrillation: Secondary | ICD-10-CM | POA: Diagnosis present

## 2014-08-23 DIAGNOSIS — Z951 Presence of aortocoronary bypass graft: Secondary | ICD-10-CM

## 2014-08-23 DIAGNOSIS — Z85038 Personal history of other malignant neoplasm of large intestine: Secondary | ICD-10-CM

## 2014-08-23 DIAGNOSIS — Z6833 Body mass index (BMI) 33.0-33.9, adult: Secondary | ICD-10-CM

## 2014-08-23 DIAGNOSIS — N451 Epididymitis: Secondary | ICD-10-CM | POA: Diagnosis present

## 2014-08-23 DIAGNOSIS — I119 Hypertensive heart disease without heart failure: Secondary | ICD-10-CM | POA: Diagnosis present

## 2014-08-23 DIAGNOSIS — W1839XA Other fall on same level, initial encounter: Secondary | ICD-10-CM | POA: Insufficient documentation

## 2014-08-23 DIAGNOSIS — S79911A Unspecified injury of right hip, initial encounter: Secondary | ICD-10-CM | POA: Diagnosis not present

## 2014-08-23 DIAGNOSIS — S3992XA Unspecified injury of lower back, initial encounter: Secondary | ICD-10-CM | POA: Insufficient documentation

## 2014-08-23 DIAGNOSIS — J189 Pneumonia, unspecified organism: Secondary | ICD-10-CM | POA: Diagnosis not present

## 2014-08-23 DIAGNOSIS — I4891 Unspecified atrial fibrillation: Secondary | ICD-10-CM | POA: Diagnosis present

## 2014-08-23 DIAGNOSIS — D696 Thrombocytopenia, unspecified: Secondary | ICD-10-CM | POA: Diagnosis not present

## 2014-08-23 DIAGNOSIS — E785 Hyperlipidemia, unspecified: Secondary | ICD-10-CM

## 2014-08-23 DIAGNOSIS — Y998 Other external cause status: Secondary | ICD-10-CM

## 2014-08-23 DIAGNOSIS — E662 Morbid (severe) obesity with alveolar hypoventilation: Secondary | ICD-10-CM | POA: Diagnosis present

## 2014-08-23 DIAGNOSIS — R0789 Other chest pain: Secondary | ICD-10-CM | POA: Diagnosis not present

## 2014-08-23 DIAGNOSIS — Y9389 Activity, other specified: Secondary | ICD-10-CM | POA: Insufficient documentation

## 2014-08-23 DIAGNOSIS — Y9289 Other specified places as the place of occurrence of the external cause: Secondary | ICD-10-CM

## 2014-08-23 DIAGNOSIS — Z9981 Dependence on supplemental oxygen: Secondary | ICD-10-CM

## 2014-08-23 DIAGNOSIS — Z85828 Personal history of other malignant neoplasm of skin: Secondary | ICD-10-CM | POA: Insufficient documentation

## 2014-08-23 DIAGNOSIS — G9340 Encephalopathy, unspecified: Secondary | ICD-10-CM | POA: Diagnosis present

## 2014-08-23 DIAGNOSIS — N433 Hydrocele, unspecified: Secondary | ICD-10-CM | POA: Diagnosis present

## 2014-08-23 DIAGNOSIS — R079 Chest pain, unspecified: Secondary | ICD-10-CM | POA: Diagnosis not present

## 2014-08-23 DIAGNOSIS — Z8701 Personal history of pneumonia (recurrent): Secondary | ICD-10-CM

## 2014-08-23 DIAGNOSIS — F0390 Unspecified dementia without behavioral disturbance: Secondary | ICD-10-CM | POA: Diagnosis present

## 2014-08-23 DIAGNOSIS — G4733 Obstructive sleep apnea (adult) (pediatric): Secondary | ICD-10-CM

## 2014-08-23 DIAGNOSIS — W19XXXA Unspecified fall, initial encounter: Secondary | ICD-10-CM

## 2014-08-23 DIAGNOSIS — R4182 Altered mental status, unspecified: Secondary | ICD-10-CM | POA: Diagnosis not present

## 2014-08-23 DIAGNOSIS — Z79899 Other long term (current) drug therapy: Secondary | ICD-10-CM | POA: Insufficient documentation

## 2014-08-23 NOTE — ED Provider Notes (Signed)
CSN: [id number]     Arrival date & time 08/23/14  2229 History   First MD Initiated Contact with Patient 08/23/14 2308     Chief Complaint  Patient presents with  . Fall  . Back Pain     (Consider location/radiation/quality/duration/timing/severity/associated sxs/prior Treatment) HPI Patient states he fell backwards from standing landing onto his back this evening. For fall and became lightheaded. States he's having pain in his right hip that radiates to his right back. He has been able to ambulate with assistance since the fall. He denies hitting his head. Patient states he's been having dizziness for some time now. The dizziness is worse in the mornings when standing up. He has yet to be evaluated for this dizziness. He has an appointment with his primary physician on 7/7. He is currently asymptomatic while lying in bed. He denies any nausea, vomiting, chest pain or shortness breath at any point. No focal weakness or numbness. Past Medical History  Diagnosis Date  . Hypertension   . Hyperlipidemia   . Depression   . Cancer     skin, colon  . OSA on CPAP   . Obesity hypoventilation syndrome   . Complex sleep apnea syndrome 05/18/2013  . Coronary artery disease   . Pneumonia     as a child   Past Surgical History  Procedure Laterality Date  . Polypectomy  1989  . Testicle surgery  2005  . Cystectomy  1960    spine  . Cataract extraction Bilateral   . Coronary artery bypass graft  1992    x3, Dr Redmond Pulling surgeon,  Dr Wynonia Lawman cardiologist  . Tonsillectomy    . Eye surgery    . Hernia repair      right inguinal  . Knee arthroscopy w/ meniscal repair Right   . Shoulder arthroscopy with rotator cuff repair and subacromial decompression Right 12/27/2013    Procedure: RIGHT SHOULDER ARTHROSCOPY SUBACROMIAL DECOMPRESSION DISTAL CLAVICLE RESECTION AND ROTATOR CUFF REPAIR, BISCEPS TENOTOMY;  Surgeon: Marin Shutter, MD;  Location: [place];  Service: Orthopedics;  Laterality: Right;   Family  History  Problem Relation Age of Onset  . Heart disease Mother    History  Substance Use Topics  . Smoking status: Never Smoker   . Smokeless tobacco: Never Used  . Alcohol Use: No    Review of Systems  Constitutional: Negative for fever and chills.  Respiratory: Negative for cough and shortness of breath.   Cardiovascular: Negative for chest pain.  Gastrointestinal: Negative for nausea, vomiting, abdominal pain and diarrhea.  Genitourinary: Negative for dysuria, frequency, hematuria and flank pain.  Musculoskeletal: Positive for back pain and arthralgias. Negative for neck pain and neck stiffness.  Skin: Negative for rash and wound.  Neurological: Positive for dizziness and light-headedness. Negative for weakness, numbness and headaches.  All other systems reviewed and are negative.     Allergies  Review of patient's allergies indicates no known allergies.  Home Medications   Prior to Admission medications   Medication Sig Start Date End Date Taking? Authorizing Provider  acetaminophen (TYLENOL) 500 MG tablet Take 1,000 mg by mouth every 8 (eight) hours as needed (for pain).    Historical Provider, MD  aspirin 81 MG tablet Take 81 mg by mouth daily.    Historical Provider, MD  atorvastatin (LIPITOR) 20 MG tablet Take 20-40 mg by mouth daily. Take 40 mg every mon, wed, and Friday and 20 mg all other days    Historical Provider, MD  diazepam (VALIUM) 5 MG tablet Take 5 mg by mouth every 8 (eight) hours as needed for anxiety.  12/15/13   Historical Provider, MD  diazepam (VALIUM) 5 MG tablet Take 0.5-1 tablets (2.5-5 mg total) by mouth every 6 (six) hours as needed for muscle spasms or sedation. 12/28/13   Olivia Mackie Shuford, PA-C  Multiple Vitamin (MULTI-VITAMIN PO) Take 1 tablet by mouth daily.     Historical Provider, MD  nebivolol (BYSTOLIC) 10 MG tablet Take 10 mg by mouth daily.    Historical Provider, MD  Omega-3 Fatty Acids (FISH OIL) 300 MG CAPS Take 1 capsule by mouth 2 (two)  times daily.     Historical Provider, MD  oxyCODONE-acetaminophen (PERCOCET) 5-325 MG per tablet Take 1-2 tablets by mouth every 4 (four) hours as needed. 12/28/13   Olivia Mackie Shuford, PA-C  oxyCODONE-acetaminophen (PERCOCET/ROXICET) 5-325 MG per tablet Take 1 tablet by mouth every 8 (eight) hours as needed for severe pain (pain).    Historical Provider, MD  promethazine (PHENERGAN) 25 MG tablet Take 12.5 mg by mouth every 8 (eight) hours as needed for nausea or vomiting.    Historical Provider, MD  ramipril (ALTACE) 10 MG capsule Take 10 mg by mouth daily.    Historical Provider, MD   BP 139/57 mmHg  Pulse 67  Temp(Src) 98.1 F (36.7 C) (Oral)  Resp 14  SpO2 93% Physical Exam  Constitutional: He is oriented to person, place, and time. He appears well-developed and well-nourished. No distress.  HENT:  Head: Normocephalic and atraumatic.  Mouth/Throat: Oropharynx is clear and moist.  Eyes: EOM are normal. Pupils are equal, round, and reactive to light.  No nystagmus  Neck: Normal range of motion. Neck supple.  No posterior midline cervical tenderness to palpation.  Cardiovascular: Normal rate and regular rhythm.  Exam reveals no gallop and no friction rub.   No murmur heard. Pulmonary/Chest: Effort normal and breath sounds normal. No respiratory distress. He has no wheezes. He has no rales.  Abdominal: Soft. Bowel sounds are normal. He exhibits no distension and no mass. There is no tenderness. There is no rebound and no guarding.  Musculoskeletal: Normal range of motion. He exhibits no edema or tenderness.  Patient with tenderness to palpation over the right ilium. He has full range of motion of the right hip without pain. No shortening of the right lower extremity. Distal pulses intact. No midline thoracic or lumbar tenderness.  Neurological: He is alert and oriented to person, place, and time.  Patient is alert and oriented x3 with clear, goal oriented speech. Patient has 5/5 motor in all  extremities. Sensation is intact to light touch. Bilateral finger-to-nose is normal with no signs of dysmetria.   Skin: Skin is warm and dry. No rash noted. No erythema.  Psychiatric: He has a normal mood and affect. His behavior is normal.  Nursing note and vitals reviewed.   ED Course  Procedures (including critical care time) Labs Review Labs Reviewed  CBC WITH DIFFERENTIAL/PLATELET  COMPREHENSIVE METABOLIC PANEL  URINALYSIS, ROUTINE W REFLEX MICROSCOPIC (NOT AT [facility])    Imaging Review No results found.   EKG Interpretation   Date/Time:  Friday August 23 2014 22:45:28 EDT Ventricular Rate:  69 PR Interval:  217 QRS Duration: 159 QT Interval:  430 QTC Calculation: 461 R Axis:   -55 Text Interpretation:  Sinus rhythm Atrial premature complex Borderline  prolonged PR interval RBBB and LAFB Baseline wander in lead(s) II III aVF  Confirmed by Lita Mains  MD, Eleisha Branscomb (  [id number]) on 08/24/2014 2:38:40 AM      MDM   Final diagnoses:  None   Patient is ambulatory without assistance. He appears comfortable. His dizziness is been going on for some time does not appear to be acute. No injuries noted on x-ray. Laboratory and urine workup is negative. Patient has a normal neurologic exam. The patient to be discharged home to follow-up with his primary physician for further workup of his dizziness. Return precautions have been given. Patient and family are in agreement with plan.     Julianne Rice, MD 08/24/14 [phone]

## 2014-08-23 NOTE — ED Notes (Signed)
Pt presents with c/o fall and back pain. Pt reports the fall occurred approx 2 hours ago. Pt reports that he is feeling pain in his back but that he is also feeling pain in his right groin area. Pt denies hitting his head.

## 2014-08-24 LAB — COMPREHENSIVE METABOLIC PANEL
ALK PHOS: 83 U/L (ref 38–126)
ALT: 15 U/L — ABNORMAL LOW (ref 17–63)
AST: 22 U/L (ref 15–41)
Albumin: 3.7 g/dL (ref 3.5–5.0)
Anion gap: 9 (ref 5–15)
BILIRUBIN TOTAL: 1.9 mg/dL — AB (ref 0.3–1.2)
BUN: 23 mg/dL — ABNORMAL HIGH (ref 6–20)
CHLORIDE: 104 mmol/L (ref 101–111)
CO2: 27 mmol/L (ref 22–32)
CREATININE: 1.22 mg/dL (ref 0.61–1.24)
Calcium: 8.9 mg/dL (ref 8.9–10.3)
GFR calc non Af Amer: 54 mL/min — ABNORMAL LOW (ref >60)
Glucose, Bld: 126 mg/dL — ABNORMAL HIGH (ref 65–99)
Potassium: 3.9 mmol/L (ref 3.5–5.1)
Sodium: 140 mmol/L (ref 135–145)
TOTAL PROTEIN: 6.4 g/dL — AB (ref 6.5–8.1)

## 2014-08-24 LAB — URINALYSIS, ROUTINE W REFLEX MICROSCOPIC
Glucose, UA: NEGATIVE mg/dL
Hgb urine dipstick: NEGATIVE
Ketones, ur: NEGATIVE mg/dL
Leukocytes, UA: NEGATIVE
NITRITE: NEGATIVE
Protein, ur: NEGATIVE mg/dL
SPECIFIC GRAVITY, URINE: 1.029 (ref 1.005–1.030)
Urobilinogen, UA: 2 mg/dL — ABNORMAL HIGH (ref 0.0–1.0)
pH: 5.5 (ref 5.0–8.0)

## 2014-08-24 LAB — CBC WITH DIFFERENTIAL/PLATELET
BASOS ABS: 0 10*3/uL (ref 0.0–0.1)
Basophils Relative: 0 % (ref 0–1)
EOS PCT: 0 % (ref 0–5)
Eosinophils Absolute: 0 10*3/uL (ref 0.0–0.7)
HCT: 38.2 % — ABNORMAL LOW (ref 39.0–52.0)
HEMOGLOBIN: 13.2 g/dL (ref 13.0–17.0)
LYMPHS ABS: 0.5 10*3/uL — AB (ref 0.7–4.0)
Lymphocytes Relative: 11 % — ABNORMAL LOW (ref 12–46)
MCH: 30.8 pg (ref 26.0–34.0)
MCHC: 34.6 g/dL (ref 30.0–36.0)
MCV: 89 fL (ref 78.0–100.0)
Monocytes Absolute: 0.5 10*3/uL (ref 0.1–1.0)
Monocytes Relative: 11 % (ref 3–12)
NEUTROS ABS: 3.7 10*3/uL (ref 1.7–7.7)
Neutrophils Relative %: 78 % — ABNORMAL HIGH (ref 43–77)
PLATELETS: 69 10*3/uL — AB (ref 150–400)
RBC: 4.29 MIL/uL (ref 4.22–5.81)
RDW: 12.9 % (ref 11.5–15.5)
WBC: 4.8 10*3/uL (ref 4.0–10.5)

## 2014-08-24 NOTE — Discharge Instructions (Signed)

## 2014-08-27 ENCOUNTER — Inpatient Hospital Stay (HOSPITAL_COMMUNITY): Payer: Medicare Other

## 2014-08-27 ENCOUNTER — Encounter (HOSPITAL_COMMUNITY): Payer: Self-pay | Admitting: *Deleted

## 2014-08-27 ENCOUNTER — Inpatient Hospital Stay (HOSPITAL_COMMUNITY)
Admission: EM | Admit: 2014-08-27 | Discharge: 2014-08-31 | DRG: 193 | Disposition: A | Discharge status: Skilled Nursing Facility | Payer: Medicare Other | Attending: Internal Medicine | Admitting: Internal Medicine

## 2014-08-27 ENCOUNTER — Emergency Department (HOSPITAL_COMMUNITY): Payer: Medicare Other

## 2014-08-27 DIAGNOSIS — N451 Epididymitis: Secondary | ICD-10-CM | POA: Diagnosis present

## 2014-08-27 DIAGNOSIS — R7989 Other specified abnormal findings of blood chemistry: Secondary | ICD-10-CM | POA: Diagnosis not present

## 2014-08-27 DIAGNOSIS — N179 Acute kidney failure, unspecified: Secondary | ICD-10-CM | POA: Diagnosis not present

## 2014-08-27 DIAGNOSIS — I119 Hypertensive heart disease without heart failure: Secondary | ICD-10-CM | POA: Diagnosis present

## 2014-08-27 DIAGNOSIS — I861 Scrotal varices: Secondary | ICD-10-CM | POA: Diagnosis not present

## 2014-08-27 DIAGNOSIS — R410 Disorientation, unspecified: Secondary | ICD-10-CM | POA: Diagnosis not present

## 2014-08-27 DIAGNOSIS — M533 Sacrococcygeal disorders, not elsewhere classified: Secondary | ICD-10-CM | POA: Diagnosis not present

## 2014-08-27 DIAGNOSIS — N433 Hydrocele, unspecified: Secondary | ICD-10-CM | POA: Diagnosis present

## 2014-08-27 DIAGNOSIS — R0789 Other chest pain: Secondary | ICD-10-CM | POA: Diagnosis not present

## 2014-08-27 DIAGNOSIS — I2510 Atherosclerotic heart disease of native coronary artery without angina pectoris: Secondary | ICD-10-CM | POA: Diagnosis present

## 2014-08-27 DIAGNOSIS — I480 Paroxysmal atrial fibrillation: Secondary | ICD-10-CM | POA: Diagnosis not present

## 2014-08-27 DIAGNOSIS — G9340 Encephalopathy, unspecified: Secondary | ICD-10-CM | POA: Diagnosis not present

## 2014-08-27 DIAGNOSIS — I4891 Unspecified atrial fibrillation: Secondary | ICD-10-CM | POA: Diagnosis present

## 2014-08-27 DIAGNOSIS — R945 Abnormal results of liver function studies: Secondary | ICD-10-CM

## 2014-08-27 DIAGNOSIS — R079 Chest pain, unspecified: Secondary | ICD-10-CM | POA: Diagnosis not present

## 2014-08-27 DIAGNOSIS — Z6833 Body mass index (BMI) 33.0-33.9, adult: Secondary | ICD-10-CM | POA: Diagnosis not present

## 2014-08-27 DIAGNOSIS — R0602 Shortness of breath: Secondary | ICD-10-CM | POA: Diagnosis not present

## 2014-08-27 DIAGNOSIS — S3992XA Unspecified injury of lower back, initial encounter: Secondary | ICD-10-CM | POA: Diagnosis not present

## 2014-08-27 DIAGNOSIS — N400 Enlarged prostate without lower urinary tract symptoms: Secondary | ICD-10-CM | POA: Diagnosis not present

## 2014-08-27 DIAGNOSIS — Z7982 Long term (current) use of aspirin: Secondary | ICD-10-CM | POA: Diagnosis not present

## 2014-08-27 DIAGNOSIS — E662 Morbid (severe) obesity with alveolar hypoventilation: Secondary | ICD-10-CM | POA: Diagnosis present

## 2014-08-27 DIAGNOSIS — E785 Hyperlipidemia, unspecified: Secondary | ICD-10-CM | POA: Diagnosis present

## 2014-08-27 DIAGNOSIS — M4806 Spinal stenosis, lumbar region: Secondary | ICD-10-CM | POA: Diagnosis present

## 2014-08-27 DIAGNOSIS — F0390 Unspecified dementia without behavioral disturbance: Secondary | ICD-10-CM | POA: Diagnosis present

## 2014-08-27 DIAGNOSIS — Z951 Presence of aortocoronary bypass graft: Secondary | ICD-10-CM | POA: Diagnosis not present

## 2014-08-27 DIAGNOSIS — K4020 Bilateral inguinal hernia, without obstruction or gangrene, not specified as recurrent: Secondary | ICD-10-CM | POA: Diagnosis not present

## 2014-08-27 DIAGNOSIS — J189 Pneumonia, unspecified organism: Secondary | ICD-10-CM | POA: Diagnosis present

## 2014-08-27 DIAGNOSIS — N503 Cyst of epididymis: Secondary | ICD-10-CM | POA: Diagnosis not present

## 2014-08-27 DIAGNOSIS — R4182 Altered mental status, unspecified: Secondary | ICD-10-CM | POA: Diagnosis not present

## 2014-08-27 DIAGNOSIS — K769 Liver disease, unspecified: Secondary | ICD-10-CM | POA: Diagnosis not present

## 2014-08-27 DIAGNOSIS — K8020 Calculus of gallbladder without cholecystitis without obstruction: Secondary | ICD-10-CM | POA: Diagnosis not present

## 2014-08-27 DIAGNOSIS — S0990XA Unspecified injury of head, initial encounter: Secondary | ICD-10-CM | POA: Diagnosis not present

## 2014-08-27 DIAGNOSIS — K5730 Diverticulosis of large intestine without perforation or abscess without bleeding: Secondary | ICD-10-CM | POA: Diagnosis not present

## 2014-08-27 DIAGNOSIS — N5082 Scrotal pain: Secondary | ICD-10-CM

## 2014-08-27 DIAGNOSIS — Z85038 Personal history of other malignant neoplasm of large intestine: Secondary | ICD-10-CM

## 2014-08-27 DIAGNOSIS — D696 Thrombocytopenia, unspecified: Secondary | ICD-10-CM | POA: Diagnosis present

## 2014-08-27 DIAGNOSIS — R160 Hepatomegaly, not elsewhere classified: Secondary | ICD-10-CM

## 2014-08-27 HISTORY — DX: Malignant neoplasm of colon, unspecified: C18.9

## 2014-08-27 LAB — URINALYSIS, ROUTINE W REFLEX MICROSCOPIC
Glucose, UA: NEGATIVE mg/dL
Hgb urine dipstick: NEGATIVE
Ketones, ur: 15 mg/dL — AB
Nitrite: POSITIVE — AB
PROTEIN: 100 mg/dL — AB
Specific Gravity, Urine: 1.034 — ABNORMAL HIGH (ref 1.005–1.030)
UROBILINOGEN UA: 4 mg/dL — AB (ref 0.0–1.0)
pH: 5 (ref 5.0–8.0)

## 2014-08-27 LAB — BASIC METABOLIC PANEL
Anion gap: 12 (ref 5–15)
BUN: 25 mg/dL — ABNORMAL HIGH (ref 6–20)
CALCIUM: 8.3 mg/dL — AB (ref 8.9–10.3)
CHLORIDE: 100 mmol/L — AB (ref 101–111)
CO2: 21 mmol/L — ABNORMAL LOW (ref 22–32)
CREATININE: 1.25 mg/dL — AB (ref 0.61–1.24)
GFR calc Af Amer: >60 mL/min (ref >60)
GFR calc non Af Amer: 53 mL/min — ABNORMAL LOW (ref >60)
GLUCOSE: 117 mg/dL — AB (ref 65–99)
Potassium: 4.2 mmol/L (ref 3.5–5.1)
Sodium: 133 mmol/L — ABNORMAL LOW (ref 135–145)

## 2014-08-27 LAB — COMPREHENSIVE METABOLIC PANEL
ALK PHOS: 108 U/L (ref 38–126)
ALT: 65 U/L — AB (ref 17–63)
AST: 79 U/L — AB (ref 15–41)
Albumin: 2.6 g/dL — ABNORMAL LOW (ref 3.5–5.0)
Anion gap: 10 (ref 5–15)
BUN: 27 mg/dL — ABNORMAL HIGH (ref 6–20)
CO2: 25 mmol/L (ref 22–32)
CREATININE: 1.19 mg/dL (ref 0.61–1.24)
Calcium: 8 mg/dL — ABNORMAL LOW (ref 8.9–10.3)
Chloride: 101 mmol/L (ref 101–111)
GFR calc Af Amer: >60 mL/min (ref >60)
GFR calc non Af Amer: 56 mL/min — ABNORMAL LOW (ref >60)
Glucose, Bld: 119 mg/dL — ABNORMAL HIGH (ref 65–99)
Potassium: 3.4 mmol/L — ABNORMAL LOW (ref 3.5–5.1)
SODIUM: 136 mmol/L (ref 135–145)
Total Bilirubin: 2.1 mg/dL — ABNORMAL HIGH (ref 0.3–1.2)
Total Protein: 5.2 g/dL — ABNORMAL LOW (ref 6.5–8.1)

## 2014-08-27 LAB — CBC WITH DIFFERENTIAL/PLATELET
BASOS PCT: 0 % (ref 0–1)
Basophils Absolute: 0 10*3/uL (ref 0.0–0.1)
EOS PCT: 0 % (ref 0–5)
Eosinophils Absolute: 0 10*3/uL (ref 0.0–0.7)
HCT: 35.9 % — ABNORMAL LOW (ref 39.0–52.0)
HEMOGLOBIN: 12.8 g/dL — AB (ref 13.0–17.0)
Lymphocytes Relative: 14 % (ref 12–46)
Lymphs Abs: 0.6 10*3/uL — ABNORMAL LOW (ref 0.7–4.0)
MCH: 31.6 pg (ref 26.0–34.0)
MCHC: 35.7 g/dL (ref 30.0–36.0)
MCV: 88.6 fL (ref 78.0–100.0)
MONO ABS: 0.3 10*3/uL (ref 0.1–1.0)
MONOS PCT: 9 % (ref 3–12)
Neutro Abs: 2.9 10*3/uL (ref 1.7–7.7)
Neutrophils Relative %: 76 % (ref 43–77)
Platelets: 70 10*3/uL — ABNORMAL LOW (ref 150–400)
RBC: 4.05 MIL/uL — ABNORMAL LOW (ref 4.22–5.81)
RDW: 13.2 % (ref 11.5–15.5)
WBC: 3.8 10*3/uL — ABNORMAL LOW (ref 4.0–10.5)

## 2014-08-27 LAB — URINE MICROSCOPIC-ADD ON

## 2014-08-27 LAB — HIV ANTIBODY (ROUTINE TESTING W REFLEX): HIV Screen 4th Generation wRfx: NONREACTIVE

## 2014-08-27 LAB — AMMONIA: Ammonia: 11 umol/L (ref 9–35)

## 2014-08-27 LAB — TROPONIN I
TROPONIN I: 0.03 ng/mL (ref <0.031)
TROPONIN I: 0.03 ng/mL (ref <0.031)
TROPONIN I: 0.03 ng/mL (ref <0.031)

## 2014-08-27 LAB — HEPATIC FUNCTION PANEL
ALK PHOS: 122 U/L (ref 38–126)
ALT: 73 U/L — AB (ref 17–63)
AST: 107 U/L — ABNORMAL HIGH (ref 15–41)
Albumin: 3.1 g/dL — ABNORMAL LOW (ref 3.5–5.0)
BILIRUBIN DIRECT: 0.7 mg/dL — AB (ref 0.1–0.5)
BILIRUBIN INDIRECT: 2.1 mg/dL — AB (ref 0.3–0.9)
Total Bilirubin: 2.8 mg/dL — ABNORMAL HIGH (ref 0.3–1.2)
Total Protein: 6.1 g/dL — ABNORMAL LOW (ref 6.5–8.1)

## 2014-08-27 LAB — I-STAT TROPONIN, ED: Troponin i, poc: 0.04 ng/mL (ref 0.00–0.08)

## 2014-08-27 LAB — INFLUENZA PANEL BY PCR (TYPE A & B)
H1N1 flu by pcr: NOT DETECTED
Influenza A By PCR: NEGATIVE
Influenza B By PCR: NEGATIVE

## 2014-08-27 LAB — EXPECTORATED SPUTUM ASSESSMENT W REFEX TO RESP CULTURE

## 2014-08-27 LAB — TSH: TSH: 0.785 u[IU]/mL (ref 0.350–4.500)

## 2014-08-27 LAB — BRAIN NATRIURETIC PEPTIDE: B Natriuretic Peptide: 49 pg/mL (ref 0.0–100.0)

## 2014-08-27 LAB — CBC
HCT: 40.7 % (ref 39.0–52.0)
Hemoglobin: 14.5 g/dL (ref 13.0–17.0)
MCH: 31.7 pg (ref 26.0–34.0)
MCHC: 35.6 g/dL (ref 30.0–36.0)
MCV: 88.9 fL (ref 78.0–100.0)
PLATELETS: UNDETERMINED 10*3/uL (ref 150–400)
RBC: 4.58 MIL/uL (ref 4.22–5.81)
RDW: 13.3 % (ref 11.5–15.5)
WBC: 5 10*3/uL (ref 4.0–10.5)

## 2014-08-27 LAB — PROTIME-INR
INR: 1.19 (ref 0.00–1.49)
Prothrombin Time: 15.2 seconds (ref 11.6–15.2)

## 2014-08-27 LAB — STREP PNEUMONIAE URINARY ANTIGEN: Strep Pneumo Urinary Antigen: NEGATIVE

## 2014-08-27 LAB — I-STAT CG4 LACTIC ACID, ED: LACTIC ACID, VENOUS: 1.27 mmol/L (ref 0.5–2.0)

## 2014-08-27 LAB — EXPECTORATED SPUTUM ASSESSMENT W GRAM STAIN, RFLX TO RESP C

## 2014-08-27 LAB — LIPASE, BLOOD: Lipase: 26 U/L (ref 22–51)

## 2014-08-27 MED ORDER — METOPROLOL TARTRATE 1 MG/ML IV SOLN: 5.0000 mg | INTRAVENOUS | Status: DC | PRN

## 2014-08-27 MED ORDER — IOHEXOL 300 MG/ML  SOLN
25.0000 mL | INTRAMUSCULAR | Status: AC
  Administered 2014-08-27 (×2): 25 mL via ORAL

## 2014-08-27 MED ORDER — DIAZEPAM 5 MG PO TABS
5.0000 mg | ORAL_TABLET | Freq: Four times a day (QID) | ORAL | Status: DC | PRN
  Administered 2014-08-28 – 2014-08-31 (×5): 5 mg via ORAL
  Filled 2014-08-27 (×5): qty 1

## 2014-08-27 MED ORDER — CEFTRIAXONE SODIUM IN DEXTROSE 20 MG/ML IV SOLN
1.0000 g | INTRAVENOUS | Status: DC
  Filled 2014-08-27: qty 50

## 2014-08-27 MED ORDER — PIPERACILLIN-TAZOBACTAM 3.375 G IVPB 30 MIN
3.3750 g | Freq: Once | INTRAVENOUS | Status: AC
  Administered 2014-08-27: 3.375 g via INTRAVENOUS
  Filled 2014-08-27: qty 50

## 2014-08-27 MED ORDER — ASPIRIN 81 MG PO CHEW
81.0000 mg | CHEWABLE_TABLET | Freq: Every day | ORAL | Status: DC
  Administered 2014-08-27 – 2014-08-28 (×2): 81 mg via ORAL
  Filled 2014-08-27 (×4): qty 1

## 2014-08-27 MED ORDER — ACETAMINOPHEN 325 MG PO TABS
650.0000 mg | ORAL_TABLET | Freq: Once | ORAL | Status: AC
  Administered 2014-08-27: 650 mg via ORAL
  Filled 2014-08-27: qty 2

## 2014-08-27 MED ORDER — POTASSIUM CHLORIDE CRYS ER 20 MEQ PO TBCR
40.0000 meq | EXTENDED_RELEASE_TABLET | Freq: Once | ORAL | Status: AC
  Administered 2014-08-27: 40 meq via ORAL
  Filled 2014-08-27: qty 2

## 2014-08-27 MED ORDER — HYDRALAZINE HCL 20 MG/ML IJ SOLN: 10.0000 mg | Freq: Four times a day (QID) | INTRAMUSCULAR | Status: DC | PRN

## 2014-08-27 MED ORDER — ACETAMINOPHEN 325 MG PO TABS
650.0000 mg | ORAL_TABLET | Freq: Four times a day (QID) | ORAL | Status: DC | PRN
  Administered 2014-08-27: 650 mg via ORAL
  Filled 2014-08-27: qty 2

## 2014-08-27 MED ORDER — SODIUM CHLORIDE 0.9 % IV BOLUS (SEPSIS)
250.0000 mL | Freq: Once | INTRAVENOUS | Status: AC
  Administered 2014-08-27: 250 mL via INTRAVENOUS

## 2014-08-27 MED ORDER — VANCOMYCIN HCL IN DEXTROSE 1-5 GM/200ML-% IV SOLN
1000.0000 mg | Freq: Once | INTRAVENOUS | Status: AC
  Administered 2014-08-27: 1000 mg via INTRAVENOUS
  Filled 2014-08-27: qty 200

## 2014-08-27 MED ORDER — SODIUM CHLORIDE 0.9 % IV SOLN: INTRAVENOUS | Status: DC

## 2014-08-27 MED ORDER — ONDANSETRON HCL 4 MG PO TABS: 4.0000 mg | ORAL_TABLET | Freq: Four times a day (QID) | ORAL | Status: DC | PRN

## 2014-08-27 MED ORDER — DOXYCYCLINE HYCLATE 100 MG PO TABS
100.0000 mg | ORAL_TABLET | Freq: Two times a day (BID) | ORAL | Status: DC
  Administered 2014-08-27 – 2014-08-31 (×8): 100 mg via ORAL
  Filled 2014-08-27 (×10): qty 1

## 2014-08-27 MED ORDER — ENOXAPARIN SODIUM 40 MG/0.4ML ~~LOC~~ SOLN
40.0000 mg | SUBCUTANEOUS | Status: DC
  Administered 2014-08-27: 40 mg via SUBCUTANEOUS
  Filled 2014-08-27 (×2): qty 0.4

## 2014-08-27 MED ORDER — ONDANSETRON HCL 4 MG/2ML IJ SOLN
4.0000 mg | Freq: Four times a day (QID) | INTRAMUSCULAR | Status: DC | PRN
  Administered 2014-08-27: 4 mg via INTRAVENOUS
  Filled 2014-08-27: qty 2

## 2014-08-27 MED ORDER — RAMIPRIL 10 MG PO CAPS
10.0000 mg | ORAL_CAPSULE | Freq: Every day | ORAL | Status: DC
  Filled 2014-08-27: qty 1

## 2014-08-27 MED ORDER — IOHEXOL 300 MG/ML  SOLN
100.0000 mL | Freq: Once | INTRAMUSCULAR | Status: AC | PRN
  Administered 2014-08-27: 100 mL via INTRAVENOUS

## 2014-08-27 MED ORDER — DEXTROSE 5 % IV SOLN
500.0000 mg | INTRAVENOUS | Status: DC
  Administered 2014-08-27: 500 mg via INTRAVENOUS
  Filled 2014-08-27: qty 500

## 2014-08-27 MED ORDER — ATORVASTATIN CALCIUM 20 MG PO TABS
20.0000 mg | ORAL_TABLET | ORAL | Status: DC
  Administered 2014-08-27 – 2014-08-29 (×2): 20 mg via ORAL
  Filled 2014-08-27 (×3): qty 1

## 2014-08-27 MED ORDER — ATORVASTATIN CALCIUM 20 MG PO TABS
40.0000 mg | ORAL_TABLET | ORAL | Status: DC
  Administered 2014-08-28 – 2014-08-30 (×2): 40 mg via ORAL
  Filled 2014-08-27 (×2): qty 2

## 2014-08-27 MED ORDER — POTASSIUM CHLORIDE IN NACL 40-0.9 MEQ/L-% IV SOLN
INTRAVENOUS | Status: DC
  Administered 2014-08-27: 75 mL/h via INTRAVENOUS
  Filled 2014-08-27 (×3): qty 1000

## 2014-08-27 MED ORDER — FISH OIL 300 MG PO CAPS: 1.0000 | ORAL_CAPSULE | Freq: Two times a day (BID) | ORAL | Status: DC

## 2014-08-27 MED ORDER — PIPERACILLIN-TAZOBACTAM 3.375 G IVPB
3.3750 g | Freq: Three times a day (TID) | INTRAVENOUS | Status: DC
  Administered 2014-08-27 – 2014-08-29 (×7): 3.375 g via INTRAVENOUS
  Filled 2014-08-27 (×10): qty 50

## 2014-08-27 MED ORDER — ACETAMINOPHEN 650 MG RE SUPP: 650.0000 mg | Freq: Four times a day (QID) | RECTAL | Status: DC | PRN

## 2014-08-27 MED ORDER — NEBIVOLOL HCL 10 MG PO TABS
10.0000 mg | ORAL_TABLET | Freq: Every day | ORAL | Status: DC
  Administered 2014-08-27 – 2014-08-31 (×5): 10 mg via ORAL
  Filled 2014-08-27 (×5): qty 1

## 2014-08-27 NOTE — ED Notes (Signed)
Patient transported to Ultrasound 

## 2014-08-27 NOTE — Progress Notes (Signed)
Patient is confused this afternoon.  Not aware of time or place.  Patient was fully alert and oriented this morning.  Texted MD to make aware of the change in his stasis.

## 2014-08-27 NOTE — ED Notes (Signed)
O2 - 90%. Pt placed on 2L [place] and O2 now -93%.

## 2014-08-27 NOTE — ED Provider Notes (Signed)
CSN: [id number]     Arrival date & time 08/27/14  0108 History  This chart was scribed for Vincent Rice, MD by Julien Nordmann, ED Scribe. This patient was seen in room A03C/A03C and the patient's care was started at 1:16 AM.    Chief Complaint  Patient presents with  . Chest Pain  . Altered Mental Status     Patient is a 81 y.o. male presenting with altered mental status. The history is provided by the patient. No language interpreter was used.  Altered Mental Status Presenting symptoms: confusion   Associated symptoms: fever, light-headedness and nausea   Associated symptoms: no abdominal pain, no headaches, no vomiting and no weakness    HPI Comments: IREN WHIPP is a 81 y.o. male who presents to the Emergency Department complaining of gradual worsening chest pain onset greater than 24 hours. Per family, he has bladder incontinence since Friday and has to ambulate with a walker due to dizziness. He also had a fever and chills last night and was given medication to alleviate with relief. He notes a tight sensation in his chest that radiates to his neck. Per relative, pt has been a bit disoriented more than his normal self for the past couple of days. He was seen on 7/1 due to a fall that occurred.    Past Medical History  Diagnosis Date  . Hypertension   . Hyperlipidemia   . Depression   . Cancer     skin, colon  . OSA on CPAP   . Obesity hypoventilation syndrome   . Complex sleep apnea syndrome 05/18/2013  . Coronary artery disease   . Pneumonia     as a child   Past Surgical History  Procedure Laterality Date  . Polypectomy  1989  . Testicle surgery  2005  . Cystectomy  1960    spine  . Cataract extraction Bilateral   . Coronary artery bypass graft  1992    x3, Dr Redmond Pulling surgeon,  Dr Wynonia Lawman cardiologist  . Tonsillectomy    . Eye surgery    . Hernia repair      right inguinal  . Knee arthroscopy w/ meniscal repair Right   . Shoulder arthroscopy with rotator cuff  repair and subacromial decompression Right 12/27/2013    Procedure: RIGHT SHOULDER ARTHROSCOPY SUBACROMIAL DECOMPRESSION DISTAL CLAVICLE RESECTION AND ROTATOR CUFF REPAIR, BISCEPS TENOTOMY;  Surgeon: Marin Shutter, MD;  Location: [place];  Service: Orthopedics;  Laterality: Right;   Family History  Problem Relation Age of Onset  . Heart disease Mother    History  Substance Use Topics  . Smoking status: Never Smoker   . Smokeless tobacco: Never Used  . Alcohol Use: No    Review of Systems  Constitutional: Positive for fever, chills and fatigue.  Respiratory: Negative for cough and shortness of breath.   Cardiovascular: Positive for chest pain.  Gastrointestinal: Positive for nausea. Negative for vomiting, abdominal pain, diarrhea and constipation.  Genitourinary: Negative for dysuria and flank pain.  Musculoskeletal: Negative for myalgias, back pain, neck pain and neck stiffness.  Neurological: Positive for dizziness and light-headedness. Negative for weakness, numbness and headaches.  Psychiatric/Behavioral: Positive for confusion.  All other systems reviewed and are negative.     Allergies  Review of patient's allergies indicates no known allergies.  Home Medications   Prior to Admission medications   Medication Sig Start Date End Date Taking? Authorizing Provider  aspirin 81 MG tablet Take 81 mg by mouth daily.  Yes Historical Provider, MD  atorvastatin (LIPITOR) 20 MG tablet Take 20-40 mg by mouth daily. Take 40 mg every mon, wed, and Friday and 20 mg all other days   Yes Historical Provider, MD  diazepam (VALIUM) 5 MG tablet Take 0.5-1 tablets (2.5-5 mg total) by mouth every 6 (six) hours as needed for muscle spasms or sedation. Patient taking differently: Take 5 mg by mouth every 6 (six) hours as needed for anxiety.  12/28/13  Yes Tracy Shuford, PA-C  Multiple Vitamin (MULTI-VITAMIN PO) Take 1 tablet by mouth daily.    Yes Historical Provider, MD  naproxen sodium (ANAPROX)  220 MG tablet Take 220 mg by mouth 2 (two) times daily as needed (pain, headaches).   Yes Historical Provider, MD  nebivolol (BYSTOLIC) 10 MG tablet Take 10 mg by mouth daily.   Yes Historical Provider, MD  Omega-3 Fatty Acids (FISH OIL) 300 MG CAPS Take 1 capsule by mouth 2 (two) times daily.    Yes Historical Provider, MD  ramipril (ALTACE) 10 MG capsule Take 10 mg by mouth daily.   Yes Historical Provider, MD  oxyCODONE-acetaminophen (PERCOCET) 5-325 MG per tablet Take 1-2 tablets by mouth every 4 (four) hours as needed. Patient not taking: Reported on 08/24/2014 12/28/13   Olivia Mackie Shuford, PA-C   BP 115/60 mmHg  Pulse 62  Temp(Src) 98.4 F (36.9 C) (Oral)  Resp 21  Ht 6\' 1"  (1.854 m)  Wt 250 lb (113.399 kg)  BMI 32.99 kg/m2  SpO2 95% Physical Exam  Constitutional: He is oriented to person, place, and time. He appears well-developed and well-nourished. No distress.  HENT:  Head: Normocephalic and atraumatic.  Mouth/Throat: Oropharyngeal exudate present.  Eyes: EOM are normal. Pupils are equal, round, and reactive to light.  Neck: Normal range of motion. Neck supple.  No posterior midline cervical tenderness to palpation. No meningismus.  Cardiovascular: Normal rate and regular rhythm.  Exam reveals no gallop and no friction rub.   No murmur heard. Pulmonary/Chest: Effort normal and breath sounds normal. No respiratory distress. He has no wheezes. He has no rales. He exhibits no tenderness.  Abdominal: Soft. Bowel sounds are normal. He exhibits no distension and no mass. There is no tenderness. There is no rebound and no guarding.  Musculoskeletal: Normal range of motion. He exhibits no edema or tenderness.  Neurological: He is alert and oriented to person, place, and time.  5/5 motor in all extremities. Sensation is fully intact.  Skin: Skin is warm and dry. No rash noted. No erythema.  Psychiatric: He has a normal mood and affect. His behavior is normal.  Nursing note and vitals  reviewed.   ED Course  Procedures  COORDINATION OF CARE:  1:22 AM Discussed treatment plan which includes imaging, lab work with pt at bedside and pt agreed to plan.  Labs Review Labs Reviewed  BASIC METABOLIC PANEL - Abnormal; Notable for the following:    Sodium 133 (*)    Chloride 100 (*)    CO2 21 (*)    Glucose, Bld 117 (*)    BUN 25 (*)    Creatinine, Ser 1.25 (*)    Calcium 8.3 (*)    GFR calc non Af Amer 53 (*)    All other components within normal limits  HEPATIC FUNCTION PANEL - Abnormal; Notable for the following:    Total Protein 6.1 (*)    Albumin 3.1 (*)    AST 107 (*)    ALT 73 (*)    Total  Bilirubin 2.8 (*)    Bilirubin, Direct 0.7 (*)    Indirect Bilirubin 2.1 (*)    All other components within normal limits  URINALYSIS, ROUTINE W REFLEX MICROSCOPIC (NOT AT [facility]) - Abnormal; Notable for the following:    Color, Urine ORANGE (*)    APPearance CLOUDY (*)    Specific Gravity, Urine 1.034 (*)    Bilirubin Urine SMALL (*)    Ketones, ur 15 (*)    Protein, ur 100 (*)    Urobilinogen, UA 4.0 (*)    Nitrite POSITIVE (*)    Leukocytes, UA TRACE (*)    All other components within normal limits  URINE MICROSCOPIC-ADD ON - Abnormal; Notable for the following:    Bacteria, UA FEW (*)    Casts HYALINE CASTS (*)    All other components within normal limits  CULTURE, BLOOD (ROUTINE X 2)  CULTURE, BLOOD (ROUTINE X 2)  CBC  BRAIN NATRIURETIC PEPTIDE  I-STAT TROPOININ, ED  I-STAT CG4 LACTIC ACID, ED    Imaging Review Ct Head Wo Contrast  08/27/2014   CLINICAL DATA:  Altered mental status.  EXAM: CT HEAD WITHOUT CONTRAST  TECHNIQUE: Contiguous axial images were obtained from the base of the skull through the vertex without intravenous contrast.  COMPARISON:  Brain MRI 09/01/2012  FINDINGS: Skull and Sinuses:Negative for fracture or destructive process. The mastoids, middle ears, and imaged paranasal sinuses are clear.  Orbits: No acute abnormality.  Brain: No acute  or remote infarction, hemorrhage, hydrocephalus, or mass lesion/mass effect. Cerebral volume loss, in keeping with age. No notable white matter disease for age. Intracranial calcified atherosclerosis.  IMPRESSION: No acute findings.  Unremarkable study for age.   Electronically Signed   By: Monte Fantasia M.D.   On: 08/27/2014 03:11   US Abdomen Complete  08/27/2014   CLINICAL DATA:  Abnormal liver function tests  EXAM: ULTRASOUND ABDOMEN COMPLETE  COMPARISON:  09/10/2010 abdominal CT  FINDINGS: Gallbladder: The gallbladder is partially contracted around numerous gallstones. Where not obscured there is no wall thickening. No focal tenderness per sonographer exam.  Common bile duct: Diameter: 2 mm.  Liver: There is a 2 cm echogenic ovoid mass in the central liver which was not seen on noncontrast study from 2012. Antegrade flow in the imaged portal venous system.  IVC: Unremarkable limited visualization.  Pancreas: Visualized portion unremarkable.  Spleen: Size and appearance within normal limits.  Right Kidney: Length: 13 cm. Echogenicity within normal limits. No solid mass or hydronephrosis visualized. An exophytic 27 mm cyst is noted.  Left Kidney: Length: 13 cm. Echogenicity within normal limits. No mass or hydronephrosis visualized.  Abdominal aorta: The proximal and bifurcation is not visible due to bowel gas. No evidence of aneurysm.  IMPRESSION: 1. 2 cm echogenic mass in the central liver, not seen on abdominal CT from 2012. Enhanced liver MRI or abdominal CT is recommended; CT is the more reliable study in the inpatient setting. 2. Cholelithiasis without acute cholecystitis.   Electronically Signed   By: Monte Fantasia M.D.   On: 08/27/2014 04:59   Dg Chest Port 1 View  08/27/2014   CLINICAL DATA:  Acute onset of generalized chest pain. Initial encounter.  EXAM: PORTABLE CHEST - 1 VIEW  COMPARISON:  Chest radiograph performed 12/24/2013  FINDINGS: The lungs are well-aerated. Left basilar airspace  opacity may reflect pneumonia. There is no evidence of pleural effusion or pneumothorax.  The cardiomediastinal silhouette is borderline enlarged. The patient is status post median sternotomy. No  acute osseous abnormalities are seen.  IMPRESSION: Left basilar airspace opacity may reflect pneumonia. Borderline cardiomegaly.   Electronically Signed   By: Garald Balding M.D.   On: 08/27/2014 01:50     EKG Interpretation None      MDM   Final diagnoses:  Community acquired pneumonia  Confusion  Chest pain, unspecified chest pain type     I personally performed the services described in this documentation, which was scribed in my presence. The recorded information has been reviewed and is accurate.  Patient with fever in the emergency department. O2 saturations in the low 90s. Chest x-ray with evidence of left lower lobe pneumonia. Normal lactic acid. Discussed with hospitalist and we will admit for pneumonia. I believe the chest pain the patient is having is likely due to this to the pneumonia. EKG without any acute changes. Normal initial troponin.  Vincent Rice, MD 08/27/14 [phone]

## 2014-08-27 NOTE — Progress Notes (Signed)
PT Cancellation Note  Patient Details Name: Vincent Sharp MRN: [id number] DOB: Dec 16, 1932   Cancelled Treatment:    Reason Eval/Treat Not Completed: Patient at procedure or test/unavailable. Pt leaving with transporter on my arrival.   Brieanna Nau 08/27/2014, 12:33 PM Pager [phone]

## 2014-08-27 NOTE — Progress Notes (Signed)
ANTIBIOTIC CONSULT NOTE - INITIAL  Pharmacy Consult for Zosyn Indication: Intra-abdominal infection  No Known Allergies  Patient Measurements: Height: 6\' 1"  (185.4 cm) Weight: 250 lb (113.399 kg) IBW/kg (Calculated) : 79.9  Vital Signs: Temp: 98.5 F (36.9 C) (07/05 0549) Temp Source: Oral (07/05 0549) BP: 115/50 mmHg (07/05 0549) Pulse Rate: 63 (07/05 0549) Intake/Output from previous day: 07/04 0701 - 07/05 0700 In: -  Out: 180 [Urine:180] Intake/Output from this shift:    Labs:  Recent Labs  08/27/14 0120 08/27/14 0643  WBC 5.0 3.8*  HGB 14.5 12.8*  PLT PLATELET CLUMPS NOTED ON SMEAR, UNABLE TO ESTIMATE 70*  CREATININE 1.25* 1.19   Estimated Creatinine Clearance: 65.3 mL/min (by C-G formula based on Cr of 1.19). No results for input(s): VANCOTROUGH, VANCOPEAK, VANCORANDOM, GENTTROUGH, GENTPEAK, GENTRANDOM, TOBRATROUGH, TOBRAPEAK, TOBRARND, AMIKACINPEAK, AMIKACINTROU, AMIKACIN in the last 72 hours.   Microbiology: No results found for this or any previous visit (from the past 720 hour(s)).  Medical History: Past Medical History  Diagnosis Date  . Hypertension   . Hyperlipidemia   . Depression   . Cancer     skin, colon  . OSA on CPAP   . Obesity hypoventilation syndrome   . Complex sleep apnea syndrome 05/18/2013  . Coronary artery disease   . Pneumonia     as a child   Assessment: 81 yo M presents on 7/5 with fever, confusion, and chest pain. At first fever assumed to be from PNA source, now pharmacy to dose Zosyn for intra-abdominal infection. [place] of abdomen shows cholelithiasis without acute cholecystitis. Now afebrile, WBC low at 3.8. SCr 1.19, CrCl ~35ml/min. Pt received Zosyn x 1 in the ED at 0300.  Goal of Therapy:  Resolution of infection  Plan:  Start Zosyn 3.375g IV Q8 Monitor clinical picture, renal function F/U C&S, abx deescalation / LOT  Beryl Balz J 08/27/2014,8:55 AM

## 2014-08-27 NOTE — Progress Notes (Signed)
Nutrition Brief Note  Patient identified on the Malnutrition Screening Tool (MST) Report.  Per readings below, patient's weight has been stable.  Wt Readings from Last 15 Encounters:  08/27/14 250 lb (113.399 kg)  12/27/13 254 lb 11.2 oz (115.531 kg)  12/24/13 254 lb 11.2 oz (115.531 kg)  12/13/13 250 lb 6.4 oz (113.581 kg)  12/11/13 249 lb 3.2 oz (113.036 kg)  09/12/13 250 lb (113.399 kg)  08/13/13 257 lb (116.574 kg)  05/18/13 265 lb (120.203 kg)  02/21/13 261 lb (118.389 kg)  01/11/13 261 lb (118.389 kg)  12/11/12 263 lb (119.296 kg)  09/08/12 261 lb (118.389 kg)    Body mass index is 32.99 kg/(m^2). Patient meets criteria for Obesity Class I based on current BMI.   Current diet order is NPO. Labs and medications reviewed.   No nutrition interventions warranted at this time. If nutrition issues arise, please consult RD.   Arthur Holms, RD, LDN Pager #: [phone] After-Hours Pager #: [phone]

## 2014-08-27 NOTE — ED Notes (Signed)
Pt given urinal and informed of need of urine

## 2014-08-27 NOTE — Progress Notes (Signed)
SLP Cancellation Note  Patient Details Name: Vincent Sharp MRN: [id number] DOB: 05/07/1932   Cancelled treatment:       Reason Eval/Treat Not Completed: Patient at procedure or test/unavailable. Pt off the floor for a procedure, also NPO for CT abdomen. Will await resumption of diet orders prior to swallow assessment.    Kailand Seda, Katherene Ponto 08/27/2014, 10:49 AM

## 2014-08-27 NOTE — Progress Notes (Signed)
PT Cancellation Note  Patient Details Name: Vincent Sharp MRN: [id number] DOB: [birth year]   Cancelled Treatment:    Reason Eval/Treat Not Completed: Patient at procedure or test/unavailable. Off floor for procedures. Will reattempt as schedule permits   Emilyrose Darrah 08/27/2014, 8:53 AM Pager [phone]

## 2014-08-27 NOTE — H&P (Addendum)
[facility] History and Physical  Vincent Sharp DGU:[id number] DOB: [birth year] DOA: 08/27/2014  Referring physician: Dr.Yelverton. PCP: [name], MD  Specialists: Dr.Tilley.  Chief Complaint: Fever and confusion and chest pain.  HPI: Vincent Sharp is a 81 y.o. male with history of CAD status post CABG, hypertension and hyperlipidemia OSA was brought to the ER after patient was found confused with fever and chest pain. Patient was brought to the ER 3 days ago after a fall while trying to stand up from a sitting position. At that time patient did not hit his head or lose consciousness but did hurt his back. Since then patient has been having incontinence of urine and difficulty walking with gait difficulties. Patient has been also having persistent cough. Yesterday patient around evening complained of some chest pain and chest congestion following which patient became confused and febrile. Patient was brought to the ER. Chest x-ray shows pneumonic process in the left side. EKG and cardiac markers were negative. Patient still complains of low back pain and has difficulty walking. Patient's LFTs are found to be elevated and sonogram shows mass in the liver which will need further workup. Patient's mental status has improved after admission. Patient has been admitted for further management.   Review of Systems: As presented in the history of presenting illness, rest negative.  Past Medical History  Diagnosis Date  . Hypertension   . Hyperlipidemia   . Depression   . Cancer     skin, colon  . OSA on CPAP   . Obesity hypoventilation syndrome   . Complex sleep apnea syndrome 05/18/2013  . Coronary artery disease   . Pneumonia     as a child   Past Surgical History  Procedure Laterality Date  . Polypectomy  1989  . Testicle surgery  2005  . Cystectomy  1960    spine  . Cataract extraction Bilateral   . Coronary artery bypass graft  1992    x3, Dr Redmond Pulling surgeon,  Dr  Wynonia Lawman cardiologist  . Tonsillectomy    . Eye surgery    . Hernia repair      right inguinal  . Knee arthroscopy w/ meniscal repair Right   . Shoulder arthroscopy with rotator cuff repair and subacromial decompression Right 12/27/2013    Procedure: RIGHT SHOULDER ARTHROSCOPY SUBACROMIAL DECOMPRESSION DISTAL CLAVICLE RESECTION AND ROTATOR CUFF REPAIR, BISCEPS TENOTOMY;  Surgeon: Marin Shutter, MD;  Location: [facility];  Service: Orthopedics;  Laterality: Right;   Social History:  reports that he has never smoked. He has never used smokeless tobacco. He reports that he does not drink alcohol or use illicit drugs. Where does patient live at home. Can patient participate in ADLs? Yes.  No Known Allergies  Family History:  Family History  Problem Relation Age of Onset  . Heart disease Mother       Prior to Admission medications   Medication Sig Start Date End Date Taking? Authorizing Provider  aspirin 81 MG tablet Take 81 mg by mouth daily.   Yes Historical Provider, MD  atorvastatin (LIPITOR) 20 MG tablet Take 20-40 mg by mouth daily. Take 40 mg every mon, wed, and Friday and 20 mg all other days   Yes Historical Provider, MD  diazepam (VALIUM) 5 MG tablet Take 0.5-1 tablets (2.5-5 mg total) by mouth every 6 (six) hours as needed for muscle spasms or sedation. Patient taking differently: Take 5 mg by mouth every 6 (six) hours as needed for anxiety.  12/28/13  Yes Tracy Shuford, PA-C  Multiple Vitamin (MULTI-VITAMIN PO) Take 1 tablet by mouth daily.    Yes Historical Provider, MD  naproxen sodium (ANAPROX) 220 MG tablet Take 220 mg by mouth 2 (two) times daily as needed (pain, headaches).   Yes Historical Provider, MD  nebivolol (BYSTOLIC) 10 MG tablet Take 10 mg by mouth daily.   Yes Historical Provider, MD  Omega-3 Fatty Acids (FISH OIL) 300 MG CAPS Take 1 capsule by mouth 2 (two) times daily.    Yes Historical Provider, MD  ramipril (ALTACE) 10 MG capsule Take 10 mg by mouth daily.   Yes  Historical Provider, MD  oxyCODONE-acetaminophen (PERCOCET) 5-325 MG per tablet Take 1-2 tablets by mouth every 4 (four) hours as needed. Patient not taking: Reported on 08/24/2014 12/28/13   Jenetta Loges, PA-C    Physical Exam: Filed Vitals:   08/27/14 0310 08/27/14 0330 08/27/14 0405 08/27/14 0504  BP: 118/52 112/52  115/60  Pulse: 74 70  62  Temp:   98.4 F (36.9 C) 98.4 F (36.9 C)  TempSrc:   Oral Oral  Resp: 23 21  21   Height:      Weight:      SpO2: 93% 93%  95%     General:   moderately built and nourished.  EyeAnicteric. No pallor.  ENT: No discharge from the ears eyes nose and mouth.  Neck: No mass felt. No neck rigidity.  Cardiovascular: S1-S2 heard.  Respiratory: No rhonchi or crepitations.  Abdomen: Soft nontender bowel sounds present.  Skin: Patient has a chronic wound on the right ankle.  Musculoskeletal: Chronic skin changes in the lower extremity is.  Psychiatric: Appears normal.  Neurologic: Alert awake oriented to time place and person. Moves all extremities.  Labs on Admission:  Basic Metabolic Panel:  Recent Labs Lab 08/23/14 2342 08/27/14 0120  NA 140 133*  K 3.9 4.2  CL 104 100*  CO2 27 21*  GLUCOSE 126* 117*  BUN 23* 25*  CREATININE 1.22 1.25*  CALCIUM 8.9 8.3*   Liver Function Tests:  Recent Labs Lab 08/23/14 2342 08/27/14 0120  AST 22 107*  ALT 15* 73*  ALKPHOS 83 122  BILITOT 1.9* 2.8*  PROT 6.4* 6.1*  ALBUMIN 3.7 3.1*   No results for input(s): LIPASE, AMYLASE in the last 168 hours. No results for input(s): AMMONIA in the last 168 hours. CBC:  Recent Labs Lab 08/23/14 2342 08/27/14 0120  WBC 4.8 5.0  NEUTROABS 3.7  --   HGB 13.2 14.5  HCT 38.2* 40.7  MCV 89.0 88.9  PLT 69* PLATELET CLUMPS NOTED ON SMEAR, UNABLE TO ESTIMATE   Cardiac Enzymes: No results for input(s): CKTOTAL, CKMB, CKMBINDEX, TROPONINI in the last 168 hours.  BNP (last 3 results)  Recent Labs  08/27/14 0120  BNP 49.0    ProBNP  (last 3 results) No results for input(s): PROBNP in the last 8760 hours.  CBG: No results for input(s): GLUCAP in the last 168 hours.  Radiological Exams on Admission: Ct Head Wo Contrast  08/27/2014   CLINICAL DATA:  Altered mental status.  EXAM: CT HEAD WITHOUT CONTRAST  TECHNIQUE: Contiguous axial images were obtained from the base of the skull through the vertex without intravenous contrast.  COMPARISON:  Brain MRI 09/01/2012  FINDINGS: Skull and Sinuses:Negative for fracture or destructive process. The mastoids, middle ears, and imaged paranasal sinuses are clear.  Orbits: No acute abnormality.  Brain: No acute or remote infarction, hemorrhage, hydrocephalus, or mass lesion/mass effect. Cerebral  volume loss, in keeping with age. No notable white matter disease for age. Intracranial calcified atherosclerosis.  IMPRESSION: No acute findings.  Unremarkable study for age.   Electronically Signed   By: Monte Fantasia M.D.   On: 08/27/2014 03:11   US Abdomen Complete  08/27/2014   CLINICAL DATA:  Abnormal liver function tests  EXAM: ULTRASOUND ABDOMEN COMPLETE  COMPARISON:  09/10/2010 abdominal CT  FINDINGS: Gallbladder: The gallbladder is partially contracted around numerous gallstones. Where not obscured there is no wall thickening. No focal tenderness per sonographer exam.  Common bile duct: Diameter: 2 mm.  Liver: There is a 2 cm echogenic ovoid mass in the central liver which was not seen on noncontrast study from 2012. Antegrade flow in the imaged portal venous system.  IVC: Unremarkable limited visualization.  Pancreas: Visualized portion unremarkable.  Spleen: Size and appearance within normal limits.  Right Kidney: Length: 13 cm. Echogenicity within normal limits. No solid mass or hydronephrosis visualized. An exophytic 27 mm cyst is noted.  Left Kidney: Length: 13 cm. Echogenicity within normal limits. No mass or hydronephrosis visualized.  Abdominal aorta: The proximal and bifurcation is not  visible due to bowel gas. No evidence of aneurysm.  IMPRESSION: 1. 2 cm echogenic mass in the central liver, not seen on abdominal CT from 2012. Enhanced liver MRI or abdominal CT is recommended; CT is the more reliable study in the inpatient setting. 2. Cholelithiasis without acute cholecystitis.   Electronically Signed   By: Monte Fantasia M.D.   On: 08/27/2014 04:59   Dg Chest Port 1 View  08/27/2014   CLINICAL DATA:  Acute onset of generalized chest pain. Initial encounter.  EXAM: PORTABLE CHEST - 1 VIEW  COMPARISON:  Chest radiograph performed 12/24/2013  FINDINGS: The lungs are well-aerated. Left basilar airspace opacity may reflect pneumonia. There is no evidence of pleural effusion or pneumothorax.  The cardiomediastinal silhouette is borderline enlarged. The patient is status post median sternotomy. No acute osseous abnormalities are seen.  IMPRESSION: Left basilar airspace opacity may reflect pneumonia. Borderline cardiomegaly.   Electronically Signed   By: Garald Balding M.D.   On: 08/27/2014 01:50    EKG: Independently reviewed. Normal sinus rhythm with RBBB.  Assessment/Plan Active Problems:   CAD (coronary artery disease), native coronary artery   S/P CABG (coronary artery bypass graft)   Pneumonia   Abnormal LFTs   Acute encephalopathy   ARF (acute renal failure)   Chest pain   Community acquired pneumonia   1. Fever probably from Pneumonia - patient has been placed on ceftriaxone and Zithromax for community-acquired pneumonia. Check urine for Legionella and strep antigen. Check influenza PCR and get swallow evaluation. 2. Acute encephalopathy most likely from fever from pneumonia - since patient's LFTs elevated check ammonia levels. 3. Abnormal LFTs with liver mass and gallstones as per the sonogram of the liver - CT of the abdomen and pelvis has been ordered to rule out any mass or abscess.. Closely follow LFTs. Patient is on antibiotics. I think patient's fever is from  pneumonia at this time abdomen is benign. But closely follow clinically and CT abdomen and pelvis. 4. Acute renal failure probably from dehydration - recheck metabolic panel after hydration. 5. Chest pain with history of CAD - chest pain may be from pneumonia. Given history of CAD we will cycle cardiac markers. 6. Low back pain with incontinence of urine and difficulty walking - patient has tenderness in the low back on exam. I have ordered MRI  of the L-spine MRI of the brain and x-rays of the pelvis. Further recommendations based on these tests. Will need physical therapy consult. 7. Chronic thrombocytopenia - follow CBC.  Since patient was complaining of scrotal pain scrotal sonogram has been ordered.  DVT Prophylaxis Lovenox.  Code Status: Full code.  Family Communication: Patient's daughter and wife.  Disposition Plan: Admit to inpatient.    Enes Rokosz N. [facility] Pager [phone].  If 7PM-7AM, please contact night-coverage [email or web address] Password [email or web address] 08/27/2014, 5:49 AM

## 2014-08-27 NOTE — Progress Notes (Signed)
UR Completed. Moriah Loughry, RN, BSN.  [phone] 

## 2014-08-27 NOTE — Plan of Care (Signed)
Vincent Sharp, is a 81 y.o. male, DOB - 08/20/1932, SEG:[id number]  Patient admitted few hours ago. Admitted for fevers with confusion along with a productive cough.  Diagnosis of community-acquired pneumonia, on appropriate antibiotics which will be continued. Follow cultures. Had mildly elevated LFTs. Ultrasound of abdomen raise suspicion for liver mass which was ruled out by CT scan.  Scrotal ultrasound shows epididymitis for which he has been placed on doxycycline.   Filed Vitals:   08/27/14 0405 08/27/14 0504 08/27/14 0549 08/27/14 1048  BP:  115/60 115/50 126/63  Pulse:  62 63 69  Temp: 98.4 F (36.9 C) 98.4 F (36.9 C) 98.5 F (36.9 C)   TempSrc: Oral Oral Oral   Resp:  21 20   Height:      Weight:      SpO2:  95% 93%         Data Review   Micro Results No results found for this or any previous visit (from the past 240 hour(s)).  Radiology Reports Dg Pelvis 1-2 Views  08/27/2014   CLINICAL DATA:  Scrotal pt.  Right SI pain.  EXAM: PELVIS - 1-2 VIEW  COMPARISON:  None.  FINDINGS: There is no evidence of pelvic fracture or diastasis. No pelvic bone lesions are seen. No significant degenerative changes are present in the SI joints. Bone mineralization is within normal limits.  IMPRESSION: Negative one view pelvis radiograph.   Electronically Signed   By: San Morelle M.D.   On: 08/27/2014 08:38   Ct Head Wo Contrast  08/27/2014   CLINICAL DATA:  Altered mental status.  EXAM: CT HEAD WITHOUT CONTRAST  TECHNIQUE: Contiguous axial images were obtained from the base of the skull through the vertex without intravenous contrast.  COMPARISON:  Brain MRI 09/01/2012  FINDINGS: Skull and Sinuses:Negative for fracture or destructive process. The mastoids, middle ears, and imaged paranasal sinuses are clear.  Orbits: No acute abnormality.  Brain: No acute or remote infarction, hemorrhage,  hydrocephalus, or mass lesion/mass effect. Cerebral volume loss, in keeping with age. No notable white matter disease for age. Intracranial calcified atherosclerosis.  IMPRESSION: No acute findings.  Unremarkable study for age.   Electronically Signed   By: Monte Fantasia M.D.   On: 08/27/2014 03:11   Mr Brain Wo Contrast  08/27/2014   CLINICAL DATA:  Recent fall.  Confusion.  Gait abnormalities.  EXAM: MRI HEAD WITHOUT CONTRAST  TECHNIQUE: Multiplanar, multiecho pulse sequences of the brain and surrounding structures were obtained without intravenous contrast.  COMPARISON:  MRI brain 09/01/2012.  FINDINGS: No acute infarct, hemorrhage, or mass lesion is present. The ventricles are of normal size. No significant extraaxial fluid collection is present.  Mild periventricular and scattered subcortical T2 hyperintensities are similar to the prior exam.  Flow is present in the major intracranial arteries. The patient is status post bilateral lens replacements. The paranasal sinuses and mastoid air cells are clear.  Skullbase is within normal limits. Midline structures are within normal limits.  IMPRESSION: 1. No acute intracranial abnormality or significant interval change. 2. Stable minimal white matter disease, likely related to chronic microvascular ischemia and essentially within normal limits for age.   Electronically Signed   By: San Morelle M.D.   On: 08/27/2014 08:58   Mr Lumbar Spine Wo Contrast  08/27/2014   CLINICAL DATA:  Fall.  Confusion.  Gait difficulties.  EXAM: MRI LUMBAR SPINE WITHOUT CONTRAST  TECHNIQUE: Multiplanar, multisequence MR imaging of the lumbar spine was performed. No intravenous  contrast was administered.  COMPARISON:  None.  FINDINGS: Segmentation: The numbering convention used for this exam termed L5-S1 as the last intervertebral disc space.  Alignment: Gentle levoconvex curve of the lumbar spine which may be positional. The apex is at L4.  Vertebrae: No destructive osseous  lesions. Negative for compression fracture. Scattered Schmorl's nodes are present. Benign hemangioma present at T12.  Conus medullaris: Normal termination at T12-L1.  Paraspinal tissues: Mild infrarenal abdominal aortic ectasia. Paraspinal muscular atrophy which may be secondary to sarcopenia. Cutaneous lesion is present in the RIGHT upper back, likely representing a sebaceous cyst.  Disc levels:  Disc Signal: Diffuse disc desiccation is expected for age.  T12-L1:  Negative.  L1-L2:  Negative.  L2-L3: Shallow circumferential disc bulging without stenosis. Mild ligamentum flavum redundancy.  L3-L4: Moderate multifactorial central stenosis is present. The spinal canal is congenitally narrow, measuring 14 mm AP. There is a broad-based posterior disc bulge and posterior ligamentum flavum redundancy. Narrowing of both lateral recesses is present. There is also mild symmetric bilateral foraminal stenosis predominately due to disc bulging and short pedicles.  L4-L5: Mild central stenosis. Shallow central disc protrusion with central annular fissure. Mild bilateral subarticular stenosis associated with facet arthrosis and bulging disc. Mild bilateral foraminal stenosis.  L5-S1: Disc desiccation but no stenosis. Facet joints appear normal. Central canal, subarticular zones and foramina adequately patent.  IMPRESSION: 1. Negative for compression fracture or acute osseous abnormality in this patient with fall and gait disturbance. 2. L3-L4 moderate multifactorial central stenosis with bilateral subarticular stenosis. 3. L4-L5 mild multifactorial central stenosis. 4. Mild symmetric bilateral foraminal stenosis at L3-L4 and L4-L5.   Electronically Signed   By: Dereck Ligas M.D.   On: 08/27/2014 08:58   US Abdomen Complete  08/27/2014   CLINICAL DATA:  Abnormal liver function tests  EXAM: ULTRASOUND ABDOMEN COMPLETE  COMPARISON:  09/10/2010 abdominal CT  FINDINGS: Gallbladder: The gallbladder is partially contracted around  numerous gallstones. Where not obscured there is no wall thickening. No focal tenderness per sonographer exam.  Common bile duct: Diameter: 2 mm.  Liver: There is a 2 cm echogenic ovoid mass in the central liver which was not seen on noncontrast study from 2012. Antegrade flow in the imaged portal venous system.  IVC: Unremarkable limited visualization.  Pancreas: Visualized portion unremarkable.  Spleen: Size and appearance within normal limits.  Right Kidney: Length: 13 cm. Echogenicity within normal limits. No solid mass or hydronephrosis visualized. An exophytic 27 mm cyst is noted.  Left Kidney: Length: 13 cm. Echogenicity within normal limits. No mass or hydronephrosis visualized.  Abdominal aorta: The proximal and bifurcation is not visible due to bowel gas. No evidence of aneurysm.  IMPRESSION: 1. 2 cm echogenic mass in the central liver, not seen on abdominal CT from 2012. Enhanced liver MRI or abdominal CT is recommended; CT is the more reliable study in the inpatient setting. 2. Cholelithiasis without acute cholecystitis.   Electronically Signed   By: Monte Fantasia M.D.   On: 08/27/2014 04:59   US Scrotum  08/27/2014   CLINICAL DATA:  Bilateral testicular pain for 1 year.  EXAM: SCROTAL ULTRASOUND  DOPPLER ULTRASOUND OF THE TESTICLES  TECHNIQUE: Complete ultrasound examination of the testicles, epididymis, and other scrotal structures was performed. Color and spectral Doppler ultrasound were also utilized to evaluate blood flow to the testicles.  COMPARISON:  CT 09/10/2010.  FINDINGS: Right testicle  Measurements: 3.11.1 x 2.4 cm.  Heterogeneous echotexture.  No mass.  Left testicle  Measurements: 5.0 x 2.2 x 3.8 cm. Heterogeneous echotexture. No mass.  Right epididymis: Prominence of the right epididymis is noted. Epididymitis cannot be excluded.  Left epididymis:  2.5 mm simple cyst.  Hydrocele:  None visualized.  Varicocele: Few prominent veins are noted on the left. Left inguinal hernia with  herniation of fat cannot be excluded. Similar finding present on prior CT of 09/10/2010.  Pulsed Doppler interrogation of both testes demonstrates normal low resistance arterial and venous waveforms bilaterally.  IMPRESSION: 1.  Small left varicocele.  2. Left inguinal hernia with herniation of fat only. Similar finding noted on prior CT of 09/10/2010.  3. Prominence of the right epididymis is noted suggesting epididymitis.   Electronically Signed   By: Marcello Moores  Register   On: 08/27/2014 12:46   Ct Abdomen Pelvis W Contrast  08/27/2014   CLINICAL DATA:  Chest pain, chest congestion and cough beginning 08/26/2014. Altered mental status. Abnormal liver function tests and possible liver mass by ultrasound.  EXAM: CT ABDOMEN AND PELVIS WITH CONTRAST  TECHNIQUE: Multidetector CT imaging of the abdomen and pelvis was performed using the standard protocol following bolus administration of intravenous contrast.  CONTRAST:  131mL OMNIPAQUE IOHEXOL 300 MG/ML  SOLN  COMPARISON:  Abdominal ultrasound earlier today. CT abdomen and pelvis 09/10/2010.  FINDINGS: The patient has small bilateral pleural effusions, larger on the left. Dense airspace disease is partially visualized in the lingula with air bronchograms present. Mild degree of dependent airspace disease is seen in the right lung base. Mild atelectasis on the right is noted. Heart size is mildly enlarged. No pericardial effusion.  No liver mass is identified. Finding on ultrasound may be due to a prominent leaflet of the diaphragm which mildly indents the liver. The spleen, adrenal glands and pancreas are unremarkable. Small exophytic cyst off the lower pole of the right kidney measures 3.0 cm in diameter. Parapelvic cysts on the left are noted. Gallstones without a evidence of cholecystitis are noted.  The patient has fat containing inguinal hernias bilaterally, larger on the left. The prostate gland is mildly prominent. Urinary bladder is unremarkable. Diverticulosis  without diverticulitis is noted. Small hiatal hernia is seen. Small bowel is unremarkable. Aortoiliac atherosclerosis without aneurysm is noted. No lymphadenopathy or fluid. No lytic or sclerotic lesion is identified.  IMPRESSION: Negative for liver mass. Finding on ultrasound may be related to a prominent leaflet of the diaphragm. There is no acute abnormality abdomen or pelvis and no evidence of neoplasm.  Findings consistent with lingular and likely left lower lobe pneumonia.  Diverticulosis without diverticulitis.  Aortoiliac atherosclerosis.  Gallstones without evidence of cholecystitis.  Mild prostatomegaly.  Fat containing inguinal hernias, larger on the left.  Small hiatal hernia.   Electronically Signed   By: Inge Rise M.D.   On: 08/27/2014 13:25   Dg Chest Port 1 View  08/27/2014   CLINICAL DATA:  Acute onset of generalized chest pain. Initial encounter.  EXAM: PORTABLE CHEST - 1 VIEW  COMPARISON:  Chest radiograph performed 12/24/2013  FINDINGS: The lungs are well-aerated. Left basilar airspace opacity may reflect pneumonia. There is no evidence of pleural effusion or pneumothorax.  The cardiomediastinal silhouette is borderline enlarged. The patient is status post median sternotomy. No acute osseous abnormalities are seen.  IMPRESSION: Left basilar airspace opacity may reflect pneumonia. Borderline cardiomegaly.   Electronically Signed   By: Garald Balding M.D.   On: 08/27/2014 01:50   Dg Hip Unilat With Pelvis 2-3 Views Right  08/23/2014  CLINICAL DATA:  Status post fall, with right groin pain. Initial encounter.  EXAM: RIGHT HIP (WITH PELVIS) 2-3 VIEWS  COMPARISON:  None.  FINDINGS: There is no evidence of fracture or dislocation. Minimal cortical irregularity overlying the right greater femoral trochanter is thought to reflect mild degenerative change. Both femoral heads are seated normally within their respective acetabula. The proximal right femur appears intact. Mild degenerative change  is noted at the lower lumbar spine. The sacroiliac joints are unremarkable in appearance.  The visualized bowel gas pattern is grossly unremarkable in appearance. Scattered phleboliths are noted within the pelvis.  IMPRESSION: No evidence of fracture or dislocation.   Electronically Signed   By: Garald Balding M.D.   On: 08/23/2014 23:53    CBC  Recent Labs Lab 08/23/14 2342 08/27/14 0120 08/27/14 0643  WBC 4.8 5.0 3.8*  HGB 13.2 14.5 12.8*  HCT 38.2* 40.7 35.9*  PLT 69* PLATELET CLUMPS NOTED ON SMEAR, UNABLE TO ESTIMATE 70*  MCV 89.0 88.9 88.6  MCH 30.8 31.7 31.6  MCHC 34.6 35.6 35.7  RDW 12.9 13.3 13.2  LYMPHSABS 0.5*  --  0.6*  MONOABS 0.5  --  0.3  EOSABS 0.0  --  0.0  BASOSABS 0.0  --  0.0    Chemistries   Recent Labs Lab 08/23/14 2342 08/27/14 0120 08/27/14 0643  NA 140 133* 136  K 3.9 4.2 3.4*  CL 104 100* 101  CO2 27 21* 25  GLUCOSE 126* 117* 119*  BUN 23* 25* 27*  CREATININE 1.22 1.25* 1.19  CALCIUM 8.9 8.3* 8.0*  AST 22 107* 79*  ALT 15* 73* 65*  ALKPHOS 83 122 108  BILITOT 1.9* 2.8* 2.1*   ------------------------------------------------------------------------------------------------------------------ estimated creatinine clearance is 65.3 mL/min (by C-G formula based on Cr of 1.19). ------------------------------------------------------------------------------------------------------------------ No results for input(s): HGBA1C in the last 72 hours. ------------------------------------------------------------------------------------------------------------------ No results for input(s): CHOL, HDL, LDLCALC, TRIG, CHOLHDL, LDLDIRECT in the last 72 hours. ------------------------------------------------------------------------------------------------------------------  Recent Labs  08/27/14 0643  TSH 0.785   ------------------------------------------------------------------------------------------------------------------ No results for input(s):  VITAMINB12, FOLATE, FERRITIN, TIBC, IRON, RETICCTPCT in the last 72 hours.  Coagulation profile  Recent Labs Lab 08/27/14 0643  INR 1.19    No results for input(s): DDIMER in the last 72 hours.  Cardiac Enzymes  Recent Labs Lab 08/27/14 0643  TROPONINI 0.03   ------------------------------------------------------------------------------------------------------------------ Invalid input(s): POCBNP

## 2014-08-27 NOTE — ED Notes (Signed)
pts family states that pt started experiencing CP last night with radiation to his arm. Also state that he seems altered from his baseline.

## 2014-08-28 ENCOUNTER — Inpatient Hospital Stay (HOSPITAL_COMMUNITY): Payer: Medicare Other

## 2014-08-28 ENCOUNTER — Encounter (HOSPITAL_COMMUNITY): Payer: Self-pay | Admitting: Cardiology

## 2014-08-28 DIAGNOSIS — J189 Pneumonia, unspecified organism: Principal | ICD-10-CM

## 2014-08-28 DIAGNOSIS — Z85038 Personal history of other malignant neoplasm of large intestine: Secondary | ICD-10-CM

## 2014-08-28 DIAGNOSIS — E669 Obesity, unspecified: Secondary | ICD-10-CM | POA: Insufficient documentation

## 2014-08-28 LAB — COMPREHENSIVE METABOLIC PANEL
ALT: 91 U/L — AB (ref 17–63)
AST: 121 U/L — ABNORMAL HIGH (ref 15–41)
Albumin: 2.6 g/dL — ABNORMAL LOW (ref 3.5–5.0)
Alkaline Phosphatase: 144 U/L — ABNORMAL HIGH (ref 38–126)
Anion gap: 11 (ref 5–15)
BUN: 20 mg/dL (ref 6–20)
CO2: 24 mmol/L (ref 22–32)
Calcium: 8.2 mg/dL — ABNORMAL LOW (ref 8.9–10.3)
Chloride: 100 mmol/L — ABNORMAL LOW (ref 101–111)
Creatinine, Ser: 1.15 mg/dL (ref 0.61–1.24)
GFR calc Af Amer: >60 mL/min (ref >60)
GFR calc non Af Amer: 58 mL/min — ABNORMAL LOW (ref >60)
GLUCOSE: 104 mg/dL — AB (ref 65–99)
Potassium: 4.9 mmol/L (ref 3.5–5.1)
Sodium: 135 mmol/L (ref 135–145)
Total Bilirubin: 2.9 mg/dL — ABNORMAL HIGH (ref 0.3–1.2)
Total Protein: 5.7 g/dL — ABNORMAL LOW (ref 6.5–8.1)

## 2014-08-28 LAB — PROTIME-INR
INR: 1.2 (ref 0.00–1.49)
Prothrombin Time: 15.3 seconds — ABNORMAL HIGH (ref 11.6–15.2)

## 2014-08-28 LAB — LEGIONELLA ANTIGEN, URINE

## 2014-08-28 LAB — CBC
HEMATOCRIT: 41.2 % (ref 39.0–52.0)
Hemoglobin: 14.8 g/dL (ref 13.0–17.0)
MCH: 31.8 pg (ref 26.0–34.0)
MCHC: 35.9 g/dL (ref 30.0–36.0)
MCV: 88.4 fL (ref 78.0–100.0)
Platelets: 85 10*3/uL — ABNORMAL LOW (ref 150–400)
RBC: 4.66 MIL/uL (ref 4.22–5.81)
RDW: 13.3 % (ref 11.5–15.5)
WBC: 4.7 10*3/uL (ref 4.0–10.5)

## 2014-08-28 LAB — TSH: TSH: 1.036 u[IU]/mL (ref 0.350–4.500)

## 2014-08-28 MED ORDER — ENOXAPARIN SODIUM 120 MG/0.8ML ~~LOC~~ SOLN
110.0000 mg | Freq: Two times a day (BID) | SUBCUTANEOUS | Status: DC
  Administered 2014-08-29 (×2): 110 mg via SUBCUTANEOUS
  Filled 2014-08-28 (×6): qty 0.8

## 2014-08-28 MED ORDER — ENOXAPARIN SODIUM 120 MG/0.8ML ~~LOC~~ SOLN
110.0000 mg | Freq: Two times a day (BID) | SUBCUTANEOUS | Status: AC
Start: 2014-08-28 — End: 2014-08-28
  Administered 2014-08-28: 110 mg via SUBCUTANEOUS
  Filled 2014-08-28 (×2): qty 0.8

## 2014-08-28 NOTE — Progress Notes (Signed)
ANTICOAGULATION CONSULT NOTE - Initial Consult  Pharmacy Consult for Enoxaparin Indication: atrial fibrillation  No Known Allergies  Patient Measurements: Height: 6\' 1"  (185.4 cm) Weight: 250 lb (113.399 kg) IBW/kg (Calculated) : 79.9  Vital Signs: Temp: 99.1 F (37.3 C) (07/06 0437) Temp Source: Oral (07/06 0437) BP: 122/59 mmHg (07/06 0437) Pulse Rate: 92 (07/06 0437)  Labs:  Recent Labs  08/27/14 0120 08/27/14 0643 08/27/14 1508 08/27/14 1826 08/28/14 0400 08/28/14 0740  HGB 14.5 12.8*  --   --  14.8  --   HCT 40.7 35.9*  --   --  41.2  --   PLT PLATELET CLUMPS NOTED ON SMEAR, UNABLE TO ESTIMATE 70*  --   --  85*  --   LABPROT  --  15.2  --   --   --  15.3*  INR  --  1.19  --   --   --  1.20  CREATININE 1.25* 1.19  --   --  1.15  --   TROPONINI  --  0.03 0.03 0.03  --   --     Estimated Creatinine Clearance: 67.6 mL/min (by C-G formula based on Cr of 1.15).   Medical History: Past Medical History  Diagnosis Date  . Hypertension   . Hyperlipidemia   . Depression   . Colon cancer     skin, colon  . OSA on CPAP   . Obesity hypoventilation syndrome   . Complex sleep apnea syndrome 05/18/2013  . Coronary artery disease   . Pneumonia     as a child    Assessment: 81 yo M presents on 7/5 with fever, confusion, and chest pain. Found to have new onset Afib. CHADS2VASc score is 4. Cardiology recommending to start Eliquis once he is stable from PNA but currently delirious and so plan to start Lovenox for now. Last dose of prophylactic lovenox was yesterday afternoon. Hgb stable, plts low at 85 (chronic)  Goal of Therapy:  Anti-Xa level 0.6-1 units/ml 4hrs after LMWH dose given Monitor platelets by anticoagulation protocol: Yes   Plan:  Start enoxaparin 110mg  SQ Q12 today. Push times back tomorrow morning for more convenient dosing. Monitor CBC, s/s of bleed F/U transition to Eliquis   Kelley Polinsky J 08/28/2014,2:17 PM

## 2014-08-28 NOTE — Consult Note (Signed)
Cardiology Consult Note  Admit date: 08/27/2014 Name: Vincent Sharp 81 y.o.  male DOB:  May 15, 1932 MRN:  [id number]  Today's date:  08/28/2014  Referring Physician:   [facility]  Primary Physician:    Vincent Sharp  Reason for Consultation:   Atrial fibrillation  IMPRESSIONS: 1. Paroxysmal atrial fibrillation - CHA2DS2VASC score is 4 and he would benefit from anticoagulation for this. His rate appears to be fairly well-controlled on the current medical regimen. I would recommend that he have an echocardiogram and check TSH also. Reasonable to go ahead and begin Eliquis for anticoagulation once he is stable from his pneumonia and other issues.  He currently is delirious and I would not place him on Eliquus and told we are certain what direction things are going on. 2. Hypertension controlled 3. Coronary artery disease with previous bypass grafting and occlusion of the vein graft to the diagonal previously at catheterization 3. Obesity with need to lose weight 4. History of colon cancer treated with surgery 5. Hyperlipidemia 6. Thrombocytopenia 7.  Pneumonia and delirium  RECOMMENDATION: Agree with anticoagulation but would hold off on starting Eliquis until he is clinically better, could cover with Lovenox or heparin until see which way platelets and liver enzymes are going. Would stop aspirin.  Continue to treat pneumonia. Await echocardiogram. Atrial fibrillation rate is fairly well controlled this time so don't feel that he will need to have additional rate control agents.  HISTORY: This very nice 81 year old male with coronary artery disease with bypass grafting in 1992. He developed angina and occluded a vein graft to the diagonal and had some medical treatment following that and has done fairly well. He has been able to exercise to a high work load without difficulty. He tolerated shoulder surgery earlier in the year just fine.  He was out with his wife working at [employer] when he  became dizzy described as vertigo and fell back injuring his back.  This was on July 1 and he was later taken to [facility] emergency room checked out and told things were okay.  He continued to have back pain and did not feel well and then began to have significant confusion, malaise, fatigue, and cough with chills.  He was admitted last night with confusion, encephalopathy and was felt to be pneumonia. He was in sinus rhythm on admission but went into atrial fibrillation this morning about 1 AM.  He is currently delirious and cannot answer questions.  The history is obtained from his wife.  He has had recurrent dizziness that he has seen an ENT doctor for described as unsteadiness on his feet.  As noted he has stable thrombocytopenia which is mild over the past few years. No prior history of bleeding. Does have a history of colon cancer in the past.  No meaningful history able to be obtained from the patient.    Past Medical History  Diagnosis Date  . Hypertension   . Hyperlipidemia   . Depression   . Colon cancer     skin, colon  . OSA on CPAP   . Obesity hypoventilation syndrome   . Complex sleep apnea syndrome 05/18/2013  . Coronary artery disease   . Pneumonia     as a child      Past Surgical History  Procedure Laterality Date  . Polypectomy  1989  . Hydrocele excision / repair    . Cataract extraction Bilateral   . Coronary artery bypass graft  1992    x3,  Dr Redmond Pulling surgeon,  Dr Wynonia Lawman cardiologist  . Tonsillectomy    . Eye surgery    . Hernia repair      right inguinal  . Knee arthroscopy w/ meniscal repair Right   . Shoulder arthroscopy with rotator cuff repair and subacromial decompression Right 12/27/2013    Procedure: RIGHT SHOULDER ARTHROSCOPY SUBACROMIAL DECOMPRESSION DISTAL CLAVICLE RESECTION AND ROTATOR CUFF REPAIR, BISCEPS TENOTOMY;  Surgeon: Marin Shutter, MD;  Location: [facility];  Service: Orthopedics;  Laterality: Right;  . Partial colectomy      Allergies:  has No  Known Allergies.   Medications: Prior to Admission medications   Medication Sig Start Date End Date Taking? Authorizing Provider  aspirin 81 MG tablet Take 81 mg by mouth daily.   Yes Historical Provider, MD  atorvastatin (LIPITOR) 20 MG tablet Take 20-40 mg by mouth daily. Take 40 mg every mon, wed, and Friday and 20 mg all other days   Yes Historical Provider, MD  diazepam (VALIUM) 5 MG tablet Take 0.5-1 tablets (2.5-5 mg total) by mouth every 6 (six) hours as needed for muscle spasms or sedation. Patient taking differently: Take 5 mg by mouth every 6 (six) hours as needed for anxiety.  12/28/13  Yes Vincent Shuford, PA-C  Multiple Vitamin (MULTI-VITAMIN PO) Take 1 tablet by mouth daily.    Yes Historical Provider, MD  naproxen sodium (ANAPROX) 220 MG tablet Take 220 mg by mouth 2 (two) times daily as needed (pain, headaches).   Yes Historical Provider, MD  nebivolol (BYSTOLIC) 10 MG tablet Take 10 mg by mouth daily.   Yes Historical Provider, MD  Omega-3 Fatty Acids (FISH OIL) 300 MG CAPS Take 1 capsule by mouth 2 (two) times daily.    Yes Historical Provider, MD  ramipril (ALTACE) 10 MG capsule Take 10 mg by mouth daily.   Yes Historical Provider, MD  oxyCODONE-acetaminophen (PERCOCET) 5-325 MG per tablet Take 1-2 tablets by mouth every 4 (four) hours as needed. Patient not taking: Reported on 08/24/2014 12/28/13   Jenetta Loges, PA-C   Family History: Family Status  Relation Status Death Age  . Mother Deceased 36    CHF  . Father Deceased 42    cancer of pancreas  . Sister Deceased     Breast cancer  . Sister Deceased     breast cancer  . Brother Deceased     CAD  . Brother Alive     Prostate cancer  . Brother Alive   . Sister Alive     Social History:   reports that he has never smoked. He has never used smokeless tobacco. He reports that he does not drink alcohol or use illicit drugs.   History   Social History Narrative   Patient lives at home with his spouseWilburn Sharp)    Patient has three children.   Married 57 yrs   Caffeine Use: 1/2 cup daily.   Patient is retired.   Patient is right-handed.   Review of Systems:   Physical Exam: BP 122/59 mmHg  Pulse 92  Temp(Src) 99.1 F (37.3 C) (Oral)  Resp 20  Ht 6\' 1"  (1.854 m)  Wt 113.399 kg (250 lb)  BMI 32.99 kg/m2  SpO2 93%  General appearance: Elderly male with Cheyne-Stokes respirations, not able to respond to voice, delirious Head: Normocephalic, without obvious abnormality, atraumatic Eyes: conjunctivae/corneas clear. PERRL, EOM's intact. Fundi benign. Neck: no adenopathy, no carotid bruit, no JVD and supple, symmetrical, trachea midline Lungs: Rhonchi bilaterally, reduced breath  sounds left base Chest wall: Prior median sternotomy scar Heart: Irregular rhythm, normal S1 and S2, no S3 Abdomen: soft, non-tender; bowel sounds normal; no masses,  no organomegaly Rectal: deferred Extremities: Bilateral venous stasis changes noted right greater than left, trace edema, no deformity or cyanosis noted Pulses: 2+ and symmetric Skin: Changes of chronic venous stasis Neurologic: Not able to adequately assess.  Cranial nerves appear grossly intact, not able to assess gait, moves all extremities  Labs: CBC  Recent Labs  08/27/14 0643 08/28/14 0400  WBC 3.8* 4.7  RBC 4.05* 4.66  HGB 12.8* 14.8  HCT 35.9* 41.2  PLT 70* 85*  MCV 88.6 88.4  MCH 31.6 31.8  MCHC 35.7 35.9  RDW 13.2 13.3  LYMPHSABS 0.6*  --   MONOABS 0.3  --   EOSABS 0.0  --   BASOSABS 0.0  --    CMP   Recent Labs  08/28/14 0400  NA 135  K 4.9  CL 100*  CO2 24  GLUCOSE 104*  BUN 20  CREATININE 1.15  CALCIUM 8.2*  PROT 5.7*  ALBUMIN 2.6*  AST 121*  ALT 91*  ALKPHOS 144*  BILITOT 2.9*  GFRNONAA 58*  GFRAA >60   BNP (last 3 results)    Component Value Date/Time   BNP 49.0 08/27/2014 0120   Cardiac Panel (last 3 results) Cardiac Panel (last 3 results)  Recent Labs  08/27/14 0643 08/27/14 1508  08/27/14 1826  TROPONINI 0.03 0.03 0.03     Radiology: Cardiomegaly, left lower lobe pneumonia  EKG: EKG yesterday sinus with RBBB, today atrial fibrillation with controlled response  Signed:  Kerry Hough MD [facility]   Cardiology Consultant  08/28/2014, 11:46 AM

## 2014-08-28 NOTE — Progress Notes (Signed)
SLP Cancellation Note  Patient Details Name: Vincent Sharp MRN: [id number] DOB: October 05, 1932   Cancelled treatment:       Reason Eval/Treat Not Completed: Fatigue/lethargy limiting ability to participate. Attempted clinical swallow eval. Pt too lethargic to accept more than 1-2 sips. Daughter reports he was awake all night. She reports pt did eat breakfast without difficulty and otherwise has no history of dysphagia. Pt accepted sips of water without difficulty. Probability of aspiration is low. Suggest pts diet be upgrade to regular consistency for lunch and dinner. SLP will f/u tomorrow for complete evaluation.   Herbie [place], [place] CCC-SLP [phone]  Lynann Beaver 08/28/2014, 10:47 AM

## 2014-08-28 NOTE — Progress Notes (Signed)
Patient Demographics:    Vincent Sharp, is a 81 y.o. male, DOB - 1932/06/06, FAO:[id number]  Admit date - 08/27/2014   Admitting Physician [name], MD  Outpatient Primary MD for the patient is [name], MD  LOS - 1   Chief Complaint  Patient presents with  . Chest Pain  . Altered Mental Status        Subjective:    Vincent Sharp today has, No headache, No chest pain, No abdominal pain - No Nausea, No new weakness tingling or numbness, No Cough - SOB.     Assessment  & Plan :     1. CAP -  cultures negative so far, continue empiric antiemetics which is Zosyn and doxycycline, no signs of overt aspiration but speech to follow. Monitor cultures. Continue supportive care.    2. Elevated liver enzymes. Likely reactionary from #1 above, discussed with patient's gastroenterologist Dr.Madoff reviewed patient's CT scan and ultrasound reports and recommends no further intervention. We will continue to monitor. He is symptom-free from GI standpoint.    3. New onset atrial fibrillation. [place] Vasc score greater than 3. Echocardiogram ordered, TSH stable, on beta blocker with rate control, currently on aspirin and low-dose Lovenox, have discussed his case with his cardiologist Dr. Duffy Bruce who will evaluate him. Most likely we will stop aspirin and Lovenox and place him on Eliquis.   4. Encephalopathy/Delirium with mild underlying dementia. CT shows changes suggestive of chronic ischemic injury with undiagnosed mild vascular dementia, family counseled about delirium, minimize narcotics and benzodiazepines, aspiration and fall precautions.   5. Chronic hydrocele with right epididymitis. For now placed on doxycycline, he's pain and symptom free, following with urology outpatient.   6.  L-spine multilevel spinal stenosis. Chronic. Outpatient follow-up with PCP and neurosurgery if required. Supportive care.    7. Chronic stable thrombocytopenia between 80 200,000 for the last 4 years. Stable monitor. No bleeding issues.     8.CAD. Continue beta blocker, statin and aspirin for now for secondary prevention no acute issues. If placed on Eliquis for A. fib by cardiology will defer aspirin discontinuation to cardiology.      Code Status : Full, prognosis guarded  Family Communication  : daughters and wife  Disposition Plan  : TBD  Consults  :  Cardiology, GI Dr. Thana Farr over the phone  Procedures  :   CT scan head. MRI brain nonacute.  MRI L-spine. Chronic changes.  CT abdomen and pelvis. Left lower lobe pneumonia, gallstones without any infection. No liver mass.  Ultrasound abdomen. Possible liver mass which was ruled out by CT scan.  Echocardiogram ordered  DVT Prophylaxis  :  Lovenox    Lab Results  Component Value Date   PLT 85* 08/28/2014    Inpatient Medications  Scheduled Meds: . aspirin  81 mg Oral Daily  . atorvastatin  20 mg Oral Q T,Th,S,Su-1800  . atorvastatin  40 mg Oral Q M,W,F-1800  . doxycycline  100 mg Oral Q12H  . enoxaparin (LOVENOX) injection  40 mg Subcutaneous Q24H  . nebivolol  10 mg Oral Daily  . piperacillin-tazobactam (ZOSYN)  IV  3.375 g Intravenous Q8H   Continuous Infusions:  PRN Meds:.acetaminophen **OR** acetaminophen, diazepam, hydrALAZINE, metoprolol, ondansetron **OR** ondansetron (ZOFRAN)  IV  Antibiotics  :     Anti-infectives    Start     Dose/Rate Route Frequency Ordered Stop   08/27/14 1400  doxycycline (VIBRA-TABS) tablet 100 mg     100 mg Oral Every 12 hours 08/27/14 1343     08/27/14 1000  piperacillin-tazobactam (ZOSYN) IVPB 3.375 g     3.375 g 12.5 mL/hr over 240 Minutes Intravenous Every 8 hours 08/27/14 0856     08/27/14 0800  azithromycin (ZITHROMAX) 500 mg in dextrose 5 % 250 mL IVPB  Status:   Discontinued     500 mg 250 mL/hr over 60 Minutes Intravenous Every 24 hours 08/27/14 0548 08/27/14 1343   08/27/14 0600  cefTRIAXone (ROCEPHIN) 1 g in dextrose 5 % 50 mL IVPB - Premix  Status:  Discontinued     1 g 100 mL/hr over 30 Minutes Intravenous Every 24 hours 08/27/14 0548 08/27/14 0812   08/27/14 0215  piperacillin-tazobactam (ZOSYN) IVPB 3.375 g     3.375 g 100 mL/hr over 30 Minutes Intravenous  Once 08/27/14 0202 08/27/14 0415   08/27/14 0215  vancomycin (VANCOCIN) IVPB 1000 mg/200 mL premix     1,000 mg 200 mL/hr over 60 Minutes Intravenous  Once 08/27/14 0202 08/27/14 0514        Objective:   Filed Vitals:   08/27/14 1952 08/27/14 2107 08/28/14 0212 08/28/14 0437  BP: 109/58  115/65 122/59  Pulse: 64  74 92  Temp: 98.6 F (37 C) 99.7 F (37.6 C) 99.8 F (37.7 C) 99.1 F (37.3 C)  TempSrc: Oral Oral Oral Oral  Resp:      Height:      Weight:      SpO2: 92%  93% 93%    Wt Readings from Last 3 Encounters:  08/27/14 113.399 kg (250 lb)  12/27/13 115.531 kg (254 lb 11.2 oz)  12/24/13 115.531 kg (254 lb 11.2 oz)     Intake/Output Summary (Last 24 hours) at 08/28/14 0941 Last data filed at 08/28/14 0739  Gross per 24 hour  Intake    360 ml  Output    100 ml  Net    260 ml     Physical Exam  Awake , is confused, No new F.N deficits, Normal affect [place].AT,PERRAL Supple Neck,No JVD, No cervical lymphadenopathy appriciated.  Symmetrical Chest wall movement, Good air movement bilaterally, CTAB iRRR,No Gallops,Rubs or new Murmurs, No Parasternal Heave +ve B.Sounds, Abd Soft, No tenderness, No organomegaly appriciated, No rebound - guarding or rigidity. No Cyanosis, Clubbing or edema, No new Rash or bruise       Data Review:   Micro Results Recent Results (from the past 240 hour(s))  Culture, sputum-assessment     Status: None   Collection Time: 08/27/14  5:34 PM  Result Value Ref Range Status   Specimen Description SPUTUM  Final   Special Requests  NONE  Final   Sputum evaluation   Final    THIS SPECIMEN IS ACCEPTABLE. RESPIRATORY CULTURE REPORT TO FOLLOW.   Report Status 08/27/2014 FINAL  Final    Radiology Reports Dg Pelvis 1-2 Views  08/27/2014   CLINICAL DATA:  Scrotal pt.  Right SI pain.  EXAM: PELVIS - 1-2 VIEW  COMPARISON:  None.  FINDINGS: There is no evidence of pelvic fracture or diastasis. No pelvic bone lesions are seen. No significant degenerative changes are present in the SI joints. Bone mineralization is within normal limits.  IMPRESSION: Negative one view pelvis radiograph.   Electronically Signed  By: San Morelle M.D.   On: 08/27/2014 08:38   Ct Head Wo Contrast  08/27/2014   CLINICAL DATA:  Altered mental status.  EXAM: CT HEAD WITHOUT CONTRAST  TECHNIQUE: Contiguous axial images were obtained from the base of the skull through the vertex without intravenous contrast.  COMPARISON:  Brain MRI 09/01/2012  FINDINGS: Skull and Sinuses:Negative for fracture or destructive process. The mastoids, middle ears, and imaged paranasal sinuses are clear.  Orbits: No acute abnormality.  Brain: No acute or remote infarction, hemorrhage, hydrocephalus, or mass lesion/mass effect. Cerebral volume loss, in keeping with age. No notable white matter disease for age. Intracranial calcified atherosclerosis.  IMPRESSION: No acute findings.  Unremarkable study for age.   Electronically Signed   By: Monte Fantasia M.D.   On: 08/27/2014 03:11   Mr Brain Wo Contrast  08/27/2014   CLINICAL DATA:  Recent fall.  Confusion.  Gait abnormalities.  EXAM: MRI HEAD WITHOUT CONTRAST  TECHNIQUE: Multiplanar, multiecho pulse sequences of the brain and surrounding structures were obtained without intravenous contrast.  COMPARISON:  MRI brain 09/01/2012.  FINDINGS: No acute infarct, hemorrhage, or mass lesion is present. The ventricles are of normal size. No significant extraaxial fluid collection is present.  Mild periventricular and scattered subcortical T2  hyperintensities are similar to the prior exam.  Flow is present in the major intracranial arteries. The patient is status post bilateral lens replacements. The paranasal sinuses and mastoid air cells are clear.  Skullbase is within normal limits. Midline structures are within normal limits.  IMPRESSION: 1. No acute intracranial abnormality or significant interval change. 2. Stable minimal white matter disease, likely related to chronic microvascular ischemia and essentially within normal limits for age.   Electronically Signed   By: San Morelle M.D.   On: 08/27/2014 08:58   Mr Lumbar Spine Wo Contrast  08/27/2014   CLINICAL DATA:  Fall.  Confusion.  Gait difficulties.  EXAM: MRI LUMBAR SPINE WITHOUT CONTRAST  TECHNIQUE: Multiplanar, multisequence MR imaging of the lumbar spine was performed. No intravenous contrast was administered.  COMPARISON:  None.  FINDINGS: Segmentation: The numbering convention used for this exam termed L5-S1 as the last intervertebral disc space.  Alignment: Gentle levoconvex curve of the lumbar spine which may be positional. The apex is at L4.  Vertebrae: No destructive osseous lesions. Negative for compression fracture. Scattered Schmorl's nodes are present. Benign hemangioma present at T12.  Conus medullaris: Normal termination at T12-L1.  Paraspinal tissues: Mild infrarenal abdominal aortic ectasia. Paraspinal muscular atrophy which may be secondary to sarcopenia. Cutaneous lesion is present in the RIGHT upper back, likely representing a sebaceous cyst.  Disc levels:  Disc Signal: Diffuse disc desiccation is expected for age.  T12-L1:  Negative.  L1-L2:  Negative.  L2-L3: Shallow circumferential disc bulging without stenosis. Mild ligamentum flavum redundancy.  L3-L4: Moderate multifactorial central stenosis is present. The spinal canal is congenitally narrow, measuring 14 mm AP. There is a broad-based posterior disc bulge and posterior ligamentum flavum redundancy. Narrowing  of both lateral recesses is present. There is also mild symmetric bilateral foraminal stenosis predominately due to disc bulging and short pedicles.  L4-L5: Mild central stenosis. Shallow central disc protrusion with central annular fissure. Mild bilateral subarticular stenosis associated with facet arthrosis and bulging disc. Mild bilateral foraminal stenosis.  L5-S1: Disc desiccation but no stenosis. Facet joints appear normal. Central canal, subarticular zones and foramina adequately patent.  IMPRESSION: 1. Negative for compression fracture or acute osseous abnormality in this patient  with fall and gait disturbance. 2. L3-L4 moderate multifactorial central stenosis with bilateral subarticular stenosis. 3. L4-L5 mild multifactorial central stenosis. 4. Mild symmetric bilateral foraminal stenosis at L3-L4 and L4-L5.   Electronically Signed   By: Dereck Ligas M.D.   On: 08/27/2014 08:58   US Abdomen Complete  08/27/2014   CLINICAL DATA:  Abnormal liver function tests  EXAM: ULTRASOUND ABDOMEN COMPLETE  COMPARISON:  09/10/2010 abdominal CT  FINDINGS: Gallbladder: The gallbladder is partially contracted around numerous gallstones. Where not obscured there is no wall thickening. No focal tenderness per sonographer exam.  Common bile duct: Diameter: 2 mm.  Liver: There is a 2 cm echogenic ovoid mass in the central liver which was not seen on noncontrast study from 2012. Antegrade flow in the imaged portal venous system.  IVC: Unremarkable limited visualization.  Pancreas: Visualized portion unremarkable.  Spleen: Size and appearance within normal limits.  Right Kidney: Length: 13 cm. Echogenicity within normal limits. No solid mass or hydronephrosis visualized. An exophytic 27 mm cyst is noted.  Left Kidney: Length: 13 cm. Echogenicity within normal limits. No mass or hydronephrosis visualized.  Abdominal aorta: The proximal and bifurcation is not visible due to bowel gas. No evidence of aneurysm.  IMPRESSION: 1. 2  cm echogenic mass in the central liver, not seen on abdominal CT from 2012. Enhanced liver MRI or abdominal CT is recommended; CT is the more reliable study in the inpatient setting. 2. Cholelithiasis without acute cholecystitis.   Electronically Signed   By: Monte Fantasia M.D.   On: 08/27/2014 04:59   US Scrotum  08/27/2014   CLINICAL DATA:  Bilateral testicular pain for 1 year.  EXAM: SCROTAL ULTRASOUND  DOPPLER ULTRASOUND OF THE TESTICLES  TECHNIQUE: Complete ultrasound examination of the testicles, epididymis, and other scrotal structures was performed. Color and spectral Doppler ultrasound were also utilized to evaluate blood flow to the testicles.  COMPARISON:  CT 09/10/2010.  FINDINGS: Right testicle  Measurements: 3.11.1 x 2.4 cm.  Heterogeneous echotexture.  No mass.  Left testicle  Measurements: 5.0 x 2.2 x 3.8 cm. Heterogeneous echotexture. No mass.  Right epididymis: Prominence of the right epididymis is noted. Epididymitis cannot be excluded.  Left epididymis:  2.5 mm simple cyst.  Hydrocele:  None visualized.  Varicocele: Few prominent veins are noted on the left. Left inguinal hernia with herniation of fat cannot be excluded. Similar finding present on prior CT of 09/10/2010.  Pulsed Doppler interrogation of both testes demonstrates normal low resistance arterial and venous waveforms bilaterally.  IMPRESSION: 1.  Small left varicocele.  2. Left inguinal hernia with herniation of fat only. Similar finding noted on prior CT of 09/10/2010.  3. Prominence of the right epididymis is noted suggesting epididymitis.   Electronically Signed   By: Marcello Moores  Register   On: 08/27/2014 12:46   Ct Abdomen Pelvis W Contrast  08/27/2014   CLINICAL DATA:  Chest pain, chest congestion and cough beginning 08/26/2014. Altered mental status. Abnormal liver function tests and possible liver mass by ultrasound.  EXAM: CT ABDOMEN AND PELVIS WITH CONTRAST  TECHNIQUE: Multidetector CT imaging of the abdomen and pelvis was  performed using the standard protocol following bolus administration of intravenous contrast.  CONTRAST:  190mL OMNIPAQUE IOHEXOL 300 MG/ML  SOLN  COMPARISON:  Abdominal ultrasound earlier today. CT abdomen and pelvis 09/10/2010.  FINDINGS: The patient has small bilateral pleural effusions, larger on the left. Dense airspace disease is partially visualized in the lingula with air bronchograms present. Mild degree  of dependent airspace disease is seen in the right lung base. Mild atelectasis on the right is noted. Heart size is mildly enlarged. No pericardial effusion.  No liver mass is identified. Finding on ultrasound may be due to a prominent leaflet of the diaphragm which mildly indents the liver. The spleen, adrenal glands and pancreas are unremarkable. Small exophytic cyst off the lower pole of the right kidney measures 3.0 cm in diameter. Parapelvic cysts on the left are noted. Gallstones without a evidence of cholecystitis are noted.  The patient has fat containing inguinal hernias bilaterally, larger on the left. The prostate gland is mildly prominent. Urinary bladder is unremarkable. Diverticulosis without diverticulitis is noted. Small hiatal hernia is seen. Small bowel is unremarkable. Aortoiliac atherosclerosis without aneurysm is noted. No lymphadenopathy or fluid. No lytic or sclerotic lesion is identified.  IMPRESSION: Negative for liver mass. Finding on ultrasound may be related to a prominent leaflet of the diaphragm. There is no acute abnormality abdomen or pelvis and no evidence of neoplasm.  Findings consistent with lingular and likely left lower lobe pneumonia.  Diverticulosis without diverticulitis.  Aortoiliac atherosclerosis.  Gallstones without evidence of cholecystitis.  Mild prostatomegaly.  Fat containing inguinal hernias, larger on the left.  Small hiatal hernia.   Electronically Signed   By: Inge Rise M.D.   On: 08/27/2014 13:25   Dg Chest Port 1 View  08/27/2014   CLINICAL  DATA:  Acute onset of generalized chest pain. Initial encounter.  EXAM: PORTABLE CHEST - 1 VIEW  COMPARISON:  Chest radiograph performed 12/24/2013  FINDINGS: The lungs are well-aerated. Left basilar airspace opacity may reflect pneumonia. There is no evidence of pleural effusion or pneumothorax.  The cardiomediastinal silhouette is borderline enlarged. The patient is status post median sternotomy. No acute osseous abnormalities are seen.  IMPRESSION: Left basilar airspace opacity may reflect pneumonia. Borderline cardiomegaly.   Electronically Signed   By: Garald Balding M.D.   On: 08/27/2014 01:50   Dg Hip Unilat With Pelvis 2-3 Views Right  08/23/2014   CLINICAL DATA:  Status post fall, with right groin pain. Initial encounter.  EXAM: RIGHT HIP (WITH PELVIS) 2-3 VIEWS  COMPARISON:  None.  FINDINGS: There is no evidence of fracture or dislocation. Minimal cortical irregularity overlying the right greater femoral trochanter is thought to reflect mild degenerative change. Both femoral heads are seated normally within their respective acetabula. The proximal right femur appears intact. Mild degenerative change is noted at the lower lumbar spine. The sacroiliac joints are unremarkable in appearance.  The visualized bowel gas pattern is grossly unremarkable in appearance. Scattered phleboliths are noted within the pelvis.  IMPRESSION: No evidence of fracture or dislocation.   Electronically Signed   By: Garald Balding M.D.   On: 08/23/2014 23:53     CBC  Recent Labs Lab 08/23/14 2342 08/27/14 0120 08/27/14 0643 08/28/14 0400  WBC 4.8 5.0 3.8* 4.7  HGB 13.2 14.5 12.8* 14.8  HCT 38.2* 40.7 35.9* 41.2  PLT 69* PLATELET CLUMPS NOTED ON SMEAR, UNABLE TO ESTIMATE 70* 85*  MCV 89.0 88.9 88.6 88.4  MCH 30.8 31.7 31.6 31.8  MCHC 34.6 35.6 35.7 35.9  RDW 12.9 13.3 13.2 13.3  LYMPHSABS 0.5*  --  0.6*  --   MONOABS 0.5  --  0.3  --   EOSABS 0.0  --  0.0  --   BASOSABS 0.0  --  0.0  --     Chemistries    Recent Labs Lab 08/23/14 2342  08/27/14 0120 08/27/14 0643 08/28/14 0400  NA 140 133* 136 135  K 3.9 4.2 3.4* 4.9  CL 104 100* 101 100*  CO2 27 21* 25 24  GLUCOSE 126* 117* 119* 104*  BUN 23* 25* 27* 20  CREATININE 1.22 1.25* 1.19 1.15  CALCIUM 8.9 8.3* 8.0* 8.2*  AST 22 107* 79* 121*  ALT 15* 73* 65* 91*  ALKPHOS 83 122 108 144*  BILITOT 1.9* 2.8* 2.1* 2.9*   ------------------------------------------------------------------------------------------------------------------ estimated creatinine clearance is 67.6 mL/min (by C-G formula based on Cr of 1.15). ------------------------------------------------------------------------------------------------------------------ No results for input(s): HGBA1C in the last 72 hours. ------------------------------------------------------------------------------------------------------------------ No results for input(s): CHOL, HDL, LDLCALC, TRIG, CHOLHDL, LDLDIRECT in the last 72 hours. ------------------------------------------------------------------------------------------------------------------  Recent Labs  08/28/14 0740  TSH 1.036   ------------------------------------------------------------------------------------------------------------------ No results for input(s): VITAMINB12, FOLATE, FERRITIN, TIBC, IRON, RETICCTPCT in the last 72 hours.  Coagulation profile  Recent Labs Lab 08/27/14 0643 08/28/14 0740  INR 1.19 1.20    No results for input(s): DDIMER in the last 72 hours.  Cardiac Enzymes  Recent Labs Lab 08/27/14 0643 08/27/14 1508 08/27/14 1826  TROPONINI 0.03 0.03 0.03   ------------------------------------------------------------------------------------------------------------------ Invalid input(s): POCBNP   Time Spent in minutes 35   Brenetta Penny K M.D on 08/28/2014 at 9:41 AM  Between 7am to 7pm - Pager - [phone]  After 7pm go to [email or web address] - password [facility]  [facility]    Office  [phone]

## 2014-08-28 NOTE — Evaluation (Signed)
Physical Therapy Evaluation Patient Details Name: Vincent Sharp MRN: [id number] DOB: 1933/01/20 Today's Date: 08/28/2014   History of Present Illness  Pt is an 81 y/o male with a PMH of CAD s/p CABG, and HTN who presents to the ED with AMS, fever, and chest pain. Pt was brought to the ED 3 days ago after a fall while trying to stand up from a seated position, during which he hurt his back. Chest x-ray shos pneumonic process in the L side, and pt was admitted for further management.   Clinical Impression  Pt admitted with above diagnosis. Pt currently with functional limitations due to the deficits listed below (see PT Problem List). At the time of PT eval pt was able to perform transfers with +2 assist mainly for safety during peri-care at [facility]. Pt's wife present and provided history for pt as he was slightly confused at times (yet answered orientation questions correctly). Anticipate that as medical status improves, functional mobility will improve as well. Planning on pt returning home with HHPT to follow at d/c. If pt not progressing as quickly as anticipated, may want to consider a higher level of care. Pt's wife states she is open to SNF if needed. Pt will benefit from skilled PT to increase their independence and safety with mobility to allow discharge to the venue listed below.       Follow Up Recommendations Home health PT;Supervision/Assistance - 24 hour    Equipment Recommendations  Rolling walker with 5" wheels    Recommendations for Other Services       Precautions / Restrictions Precautions Precautions: Fall Restrictions Weight Bearing Restrictions: No      Mobility  Bed Mobility Overal bed mobility: Needs Assistance Bed Mobility: Supine to Sit;Sit to Supine     Supine to sit: Mod assist Sit to supine: Min assist   General bed mobility comments: Pt was able to transition to/from EOB with assistance for initiating movement with LE's, trunk elevation to full sitting,  and for LE elevation back into bed.   Transfers Overall transfer level: Needs assistance Equipment used: 1 person hand held assist Transfers: Sit to/from [facility] Sit to Stand: Min assist Stand pivot transfers: Min assist       General transfer comment: Steadying assist as pt powers-up to full standing and takes a few pivotal steps around to/from the [facility].   Ambulation/Gait             General Gait Details: Deferred further mobility as pt is still having frequent, incontinent stools  Stairs            Wheelchair Mobility    Modified Rankin (Stroke Patients Only)       Balance Overall balance assessment: History of Falls                                           Pertinent Vitals/Pain Pain Assessment: No/denies pain    Home Living Family/patient expects to be discharged to:: Private residence Living Arrangements: Spouse/significant other Available Help at Discharge: Family;Available 24 hours/day Type of Home: House Home Access: Stairs to enter   [employer] of Steps: 2 Home Layout: One level Home Equipment: Shower seat      Prior Function Level of Independence: Independent         Comments: Independent, but ADLs were difficult due to limited ROM of his right  shoulder. prior to this episode pt was using his wife's walker (~1 week PTA) due to gait difficulties     Hand Dominance   Dominant Hand: Right    Extremity/Trunk Assessment   Upper Extremity Assessment: Defer to OT evaluation           Lower Extremity Assessment: Generalized weakness      Cervical / Trunk Assessment: Kyphotic  Communication   Communication: No difficulties  Cognition Arousal/Alertness: Lethargic Behavior During Therapy: Flat affect Overall Cognitive Status: Within Functional Limits for tasks assessed                      General Comments      Exercises        Assessment/Plan    PT Assessment  Patient needs continued PT services  PT Diagnosis Difficulty walking;Generalized weakness   PT Problem List Decreased strength;Decreased range of motion;Decreased activity tolerance;Decreased balance;Decreased mobility;Decreased knowledge of use of DME;Decreased safety awareness;Decreased knowledge of precautions;Cardiopulmonary status limiting activity  PT Treatment Interventions DME instruction;Gait training;Stair training;Functional mobility training;Therapeutic activities;Therapeutic exercise;Neuromuscular re-education;Patient/family education   PT Goals (Current goals can be found in the Care Plan section) Acute Rehab PT Goals Patient Stated Goal: Pt did not state goals during session.  PT Goal Formulation: With patient/family Time For Goal Achievement: 09/04/14 Potential to Achieve Goals: Good    Frequency Min 3X/week   Barriers to discharge        Co-evaluation               End of Session Equipment Utilized During Treatment: Gait belt;Oxygen Activity Tolerance: Patient limited by fatigue Patient left: in bed;with call bell/phone within reach;with nursing/sitter in room;with family/visitor present Nurse Communication: Mobility status         Time: 4742-5956 PT Time Calculation (min) (ACUTE ONLY): 27 min   Charges:   PT Evaluation $Initial PT Evaluation Tier I: 1 Procedure PT Treatments $Therapeutic Activity: 8-22 mins   PT G Codes:        Rolinda Roan 2014-09-05, 2:51 PM   Rolinda Roan, PT, DPT Acute Rehabilitation Services Pager: [phone]

## 2014-08-28 NOTE — Progress Notes (Signed)
  Echocardiogram 2D Echocardiogram has been performed.  Vincent Sharp 08/28/2014, 12:42 PM

## 2014-08-28 NOTE — Care Management (Signed)
Important Message  Patient Details  Name: Vincent Sharp MRN: [id number] Date of Birth: [birth year]   Medicare Important Message Given:  Yes-second notification given    Maryclare Labrador, RN 08/28/2014, 3:20 PM

## 2014-08-29 LAB — COMPREHENSIVE METABOLIC PANEL
ALBUMIN: 2.3 g/dL — AB (ref 3.5–5.0)
ALT: 78 U/L — ABNORMAL HIGH (ref 17–63)
AST: 91 U/L — ABNORMAL HIGH (ref 15–41)
Alkaline Phosphatase: 125 U/L (ref 38–126)
Anion gap: 8 (ref 5–15)
BUN: 21 mg/dL — AB (ref 6–20)
CO2: 23 mmol/L (ref 22–32)
Calcium: 8.1 mg/dL — ABNORMAL LOW (ref 8.9–10.3)
Chloride: 102 mmol/L (ref 101–111)
Creatinine, Ser: 1.32 mg/dL — ABNORMAL HIGH (ref 0.61–1.24)
GFR calc Af Amer: 57 mL/min — ABNORMAL LOW (ref >60)
GFR calc non Af Amer: 49 mL/min — ABNORMAL LOW (ref >60)
Glucose, Bld: 98 mg/dL (ref 65–99)
POTASSIUM: 4.2 mmol/L (ref 3.5–5.1)
Sodium: 133 mmol/L — ABNORMAL LOW (ref 135–145)
Total Bilirubin: 1.6 mg/dL — ABNORMAL HIGH (ref 0.3–1.2)
Total Protein: 5.6 g/dL — ABNORMAL LOW (ref 6.5–8.1)

## 2014-08-29 LAB — CBC
HCT: 36.8 % — ABNORMAL LOW (ref 39.0–52.0)
Hemoglobin: 13 g/dL (ref 13.0–17.0)
MCH: 31.6 pg (ref 26.0–34.0)
MCHC: 35.3 g/dL (ref 30.0–36.0)
MCV: 89.3 fL (ref 78.0–100.0)
Platelets: 103 10*3/uL — ABNORMAL LOW (ref 150–400)
RBC: 4.12 MIL/uL — ABNORMAL LOW (ref 4.22–5.81)
RDW: 13.2 % (ref 11.5–15.5)
WBC: 4.5 10*3/uL (ref 4.0–10.5)

## 2014-08-29 LAB — AMMONIA: AMMONIA: 13 umol/L (ref 9–35)

## 2014-08-29 LAB — CLOSTRIDIUM DIFFICILE BY PCR: Toxigenic C. Difficile by PCR: NEGATIVE

## 2014-08-29 MED ORDER — METRONIDAZOLE 500 MG PO TABS
500.0000 mg | ORAL_TABLET | Freq: Three times a day (TID) | ORAL | Status: DC
  Administered 2014-08-29 – 2014-08-30 (×3): 500 mg via ORAL
  Filled 2014-08-29 (×6): qty 1

## 2014-08-29 MED ORDER — SODIUM CHLORIDE 0.9 % IV SOLN
INTRAVENOUS | Status: DC
  Administered 2014-08-29: 08:00:00 via INTRAVENOUS

## 2014-08-29 MED ORDER — AMIODARONE HCL 200 MG PO TABS
200.0000 mg | ORAL_TABLET | Freq: Two times a day (BID) | ORAL | Status: DC
Start: 2014-08-29 — End: 2014-08-31
  Administered 2014-08-29 – 2014-08-31 (×5): 200 mg via ORAL
  Filled 2014-08-29 (×6): qty 1

## 2014-08-29 MED ORDER — CEFTRIAXONE SODIUM IN DEXTROSE 20 MG/ML IV SOLN
1.0000 g | INTRAVENOUS | Status: DC
  Administered 2014-08-29: 1 g via INTRAVENOUS
  Filled 2014-08-29 (×3): qty 50

## 2014-08-29 NOTE — Clinical Social Work Placement (Signed)
   CLINICAL SOCIAL WORK PLACEMENT  NOTE  Date:  08/29/2014  Patient Details  Name: Vincent Sharp MRN: [id number] Date of Birth: 1932/09/28  Clinical Social Work is seeking post-discharge placement for this patient at the [place] level of care (*CSW will initial, date and re-position this form in  chart as items are completed):  Yes   Patient/family provided with [place] Work Department's list of facilities offering this level of care within the geographic area requested by the patient (or if unable, by the patient's family).  Yes   Patient/family informed of their freedom to choose among providers that offer the needed level of care, that participate in Medicare, Medicaid or managed care program needed by the patient, have an available bed and are willing to accept the patient.  Yes   Patient/family informed of [facility]'s ownership interest in [facility] and [facility], as well as of the fact that they are under no obligation to receive care at these facilities.  PASRR submitted to EDS on 08/29/14     PASRR number received on 08/29/14     Existing PASRR number confirmed on       FL2 transmitted to all facilities in geographic area requested by pt/family on 08/29/14     FL2 transmitted to all facilities within larger geographic area on       Patient informed that his/her managed care company has contracts with or will negotiate with certain facilities, including the following:            Patient/family informed of bed offers received.  Patient chooses bed at       Physician recommends and patient chooses bed at      Patient to be transferred to   on  .  Patient to be transferred to facility by       Patient family notified on   of transfer.  Name of family member notified:        PHYSICIAN Please sign FL2     Additional Comment:    _______________________________________________ Cranford Mon, LCSW 08/29/2014,  10:38 AM

## 2014-08-29 NOTE — Clinical Social Work Note (Signed)
Clinical Social Work Assessment  Patient Details  Name: Vincent Sharp MRN: [id number] Date of Birth: [birth year]  Date of referral:  08/29/14               Reason for consult:  Facility Placement                Permission sought to share information with:  Family Supports, Chartered certified accountant granted to share information::  Yes, Hospital doctor (pt disoriented but gave consent)  Name::        Agency::  [facility]  Relationship::  daughters/spouse  Contact Information:     Housing/Transportation Living arrangements for the past 2 months:  Single Family Home Source of Information:  Patient, Adult Children Patient Interpreter Needed:  None Criminal Activity/Legal Involvement Pertinent to Current Situation/Hospitalization:  No - Comment as needed Significant Relationships:  Adult Children, Spouse Lives with:  Spouse Do you feel safe going back to the place where you live?  Yes Need for family participation in patient care:  Yes (Comment)  Care giving concerns:  Pt lives at home with wife who is ill and is still in hospital for knee surgery- will not be able to provide physical assistance at home.  Pt has 3 daughters two of which live locally but work full time- one daughter could stay with patient but only in the evenings.  3rd daughter lives out of state   Facilities manager / plan: CSW spoke with pt and daughter about recommendation for 24 hour assistance vs family preference for SNF.  Employment status:  Retired Health visitor PT Recommendations:  Home with [place], [place] / Referral to community resources:  [place]  Patient/Family's Response to care:  Pt was somewhat silent throughout the interview but responded when asked direct questions- he is agreeable to SNF for short term rehab until he can return home with little assistance needed.  Pt dtr thinks that SNF is the  safest option for now and feels strongly that pt can not go home with pt wife as main support during the day  Patient/Family's Understanding of and Emotional Response to Diagnosis, Current Treatment, and Prognosis:  Pt dtr appeared to have good understanding of pt condition and expressed no questions or concerns regarding treatment plan at this time  Emotional Assessment Appearance:  Appears stated age Attitude/Demeanor/Rapport:  Lethargic Affect (typically observed):  Appropriate Orientation:  Oriented to Self, Fluctuating Orientation (Suspected and/or reported Sundowners) Alcohol / Substance use:  Not Applicable Psych involvement (Current and /or in the community):  No (Comment)  Discharge Needs  Concerns to be addressed:  Care Coordination, Home Safety Concerns Readmission within the last 30 days:  No Current discharge risk:  Physical Impairment Barriers to Discharge:  Continued Medical Work up   [employer], LCSW 08/29/2014, 10:34 AM

## 2014-08-29 NOTE — Progress Notes (Signed)
Subjective:  Patient is sitting up at the bedside today and is much more alert. He still needs to be oriented to place but knew that I was the cardiologist and could call my name after a time. Still some congestion and shortness of breath. Continues in atrial fibrillation. Lovenox was started yesterday. No chest pain. Delirium is resolving.  Objective:  Vital Signs in the last 24 hours: BP 121/63 mmHg  Pulse 89  Temp(Src) 98.9 F (37.2 C) (Oral)  Resp 20  Ht 6\' 1"  (1.854 m)  Wt 108.41 kg (239 lb)  BMI 31.54 kg/m2  SpO2 93%  Physical Exam: Pleasant male in no acute distress sitting up in bed side still mildly disoriented Lungs: Reduced breath sounds left base Cardiac:  irregular rhythm, normal S1 and S2, no S3 Abdomen:  Soft, nontender, no masses Extremities:  No edema present  Intake/Output from previous day: 07/06 0701 - 07/07 0700 In: 840 [P.O.:840] Out: 300 [Urine:300]  Weight Filed Weights   08/27/14 0156 08/29/14 0535  Weight: 113.399 kg (250 lb) 108.41 kg (239 lb)    Lab Results: Basic Metabolic Panel:  Recent Labs  08/28/14 0400 08/29/14 0325  NA 135 133*  K 4.9 4.2  CL 100* 102  CO2 24 23  GLUCOSE 104* 98  BUN 20 21*  CREATININE 1.15 1.32*   CBC:  Recent Labs  08/27/14 0643 08/28/14 0400 08/29/14 0325  WBC 3.8* 4.7 4.5  NEUTROABS 2.9  --   --   HGB 12.8* 14.8 13.0  HCT 35.9* 41.2 36.8*  MCV 88.6 88.4 89.3  PLT 70* 85* 103*   Cardiac Enzymes: Troponin (Point of Care Test)  Recent Labs  08/27/14 0146  TROPIPOC 0.04   Cardiac Panel (last 3 results)  Recent Labs  08/27/14 0643 08/27/14 1508 08/27/14 1826  TROPONINI 0.03 0.03 0.03    Telemetry: Atrial fibrillation with controlled response  Assessment/Plan:  1. Paroxysmal atrial fibrillation with recent onset 2. Hypertensive heart disease 3. Delirium improved today 4. Coronary artery disease with previous bypass grafting without angina 5. Thombocytopenia  resolving  Recommendations:  He is more alert today and his delirium has improved. He continues in atrial fibrillation. He was in sinus rhythm on admission. Echocardiogram shows hypertensive heart disease. I would start him on amiodarone to try to convert him early. I would hold off on initiation of Eliquis until we are sure that his renal function and liver enzymes and all are stable. Continue Lovenox for the time being.     Kerry Hough  MD [facility] Cardiology  08/29/2014, 10:06 AM

## 2014-08-29 NOTE — Evaluation (Signed)
Clinical/Bedside Swallow Evaluation Patient Details  Name: Vincent Sharp MRN: [id number] Date of Birth: 1932/03/30  Today's Date: 08/29/2014 Time: SLP Start Time (ACUTE ONLY): 0850 SLP Stop Time (ACUTE ONLY): 0902 SLP Time Calculation (min) (ACUTE ONLY): 12 min  Past Medical History:  Past Medical History  Diagnosis Date  . Hypertension   . Hyperlipidemia   . Depression   . Colon cancer     skin, colon  . OSA on CPAP   . Obesity hypoventilation syndrome   . Complex sleep apnea syndrome 05/18/2013  . Coronary artery disease   . Pneumonia     as a child   Past Surgical History:  Past Surgical History  Procedure Laterality Date  . Polypectomy  1989  . Hydrocele excision / repair    . Cataract extraction Bilateral   . Coronary artery bypass graft  1992    x3, Dr Redmond Pulling surgeon,  Dr Wynonia Lawman cardiologist  . Tonsillectomy    . Eye surgery    . Hernia repair      right inguinal  . Knee arthroscopy w/ meniscal repair Right   . Shoulder arthroscopy with rotator cuff repair and subacromial decompression Right 12/27/2013    Procedure: RIGHT SHOULDER ARTHROSCOPY SUBACROMIAL DECOMPRESSION DISTAL CLAVICLE RESECTION AND ROTATOR CUFF REPAIR, BISCEPS TENOTOMY;  Surgeon: Marin Shutter, MD;  Location: [facility];  Service: Orthopedics;  Laterality: Right;  . Partial colectomy     HPI:  Vincent Sharp is a 81 y.o. male with history of CAD status post CABG, hypertension and hyperlipidemia OSA was brought to the ER after patient was found confused with fever and chest pain. Patient was brought to the ER 3 days ago after a fall while trying to stand up from a sitting position. At that time patient did not hit his head or lose consciousness but did hurt his back. Since then patient has been having incontinence of urine and difficulty walking with gait difficulties. Patient has been also having persistent cough. Yesterday patient around evening complained of some chest pain and chest congestion  following which patient became confused and febrile. Patient was brought to the ER. Chest x-ray shows pneumonic process in the left side. No prior notes in chart.    Assessment / Plan / Recommendation Clinical Impression  Pt demonstrates normal swallow function, no signs of aspiration. Pt may continue current diet, no f/u needed.     Aspiration Risk  Mild    Diet Recommendation Age appropriate regular solids;Thin   Medication Administration: Whole meds with liquid    Other  Recommendations Oral Care Recommendations: Oral care BID   Follow Up Recommendations       Frequency and Duration        Pertinent Vitals/Pain NA    SLP Swallow Goals     Swallow Study Prior Functional Status       General Other Pertinent Information: Vincent Sharp is a 81 y.o. male with history of CAD status post CABG, hypertension and hyperlipidemia OSA was brought to the ER after patient was found confused with fever and chest pain. Patient was brought to the ER 3 days ago after a fall while trying to stand up from a sitting position. At that time patient did not hit his head or lose consciousness but did hurt his back. Since then patient has been having incontinence of urine and difficulty walking with gait difficulties. Patient has been also having persistent cough. Yesterday patient around evening complained of some chest pain and  chest congestion following which patient became confused and febrile. Patient was brought to the ER. Chest x-ray shows pneumonic process in the left side. No prior notes in chart.  Type of Study: Bedside swallow evaluation Diet Prior to this Study: Regular;Thin liquids Temperature Spikes Noted: No Respiratory Status: Room air History of Recent Intubation: No Behavior/Cognition: Alert;Cooperative;Pleasant mood Oral Cavity - Dentition: Adequate natural dentition/normal for age Self-Feeding Abilities: Able to feed self Patient Positioning: Upright in  chair/Tumbleform Baseline Vocal Quality: Normal Volitional Cough: Strong Volitional Swallow: Able to elicit    Oral/Motor/Sensory Function Overall Oral Motor/Sensory Function: Appears within functional limits for tasks assessed   Ice Chips     Thin Liquid Thin Liquid: Within functional limits    Nectar Thick     Honey Thick     Puree     Solid   GO    Solid: Within functional limits       Vincent Sharp, Katherene Ponto 08/29/2014,9:09 AM

## 2014-08-29 NOTE — Progress Notes (Signed)
RT spoke with Pt and Pt's daughter in regards to CPAP.  Pt's daughter stated that pt has tried multiple mask with his machine and can not find one that he is comfortable with.  Pt's daughter states that Pt hates the CPAP machine.  Pt also agreed and has refused CPAP for the night.  RT to monitor and assess as needed.

## 2014-08-29 NOTE — Progress Notes (Signed)
Physical Therapy Treatment Patient Details Name: Vincent Sharp MRN: [id number] DOB: 05-Sep-1932 Today's Date: 08/29/2014    History of Present Illness Pt is an 81 y/o male with a PMH of CAD s/p CABG, and HTN who presents to the ED with AMS, fever, and chest pain. Pt was brought to the ED 3 days ago after a fall while trying to stand up from a seated position, during which he hurt his back. Chest x-ray shos pneumonic process in the L side, and pt was admitted for further management.     PT Comments    Pt progressing towards physical therapy goals. Was able to ambulate this session with RW and min assist for safety and walker management. Feel pt could have ambulated farther if chair follow was available. Discussed d/c plans with family and now recommending short term rehab at the SNF level. Feel pt will benefit from increased frequency of follow-up therapy that SNF could provide vs. HHPT. Will continue to follow and progress as able per POC.   Follow Up Recommendations  SNF;Supervision/Assistance - 24 hour     Equipment Recommendations  Rolling walker with 5" wheels    Recommendations for Other Services       Precautions / Restrictions Precautions Precautions: Fall Restrictions Weight Bearing Restrictions: No    Mobility  Bed Mobility Overal bed mobility: Needs Assistance Bed Mobility: Sit to Supine       Sit to supine: Min assist   General bed mobility comments: Assist to elevate LE's back into the bed.   Transfers Overall transfer level: Needs assistance Equipment used: Rolling walker (2 wheeled) Transfers: Sit to/from Stand Sit to Stand: Min assist;+2 safety/equipment         General transfer comment: Steadying assist as pt powered-up to full standing position from low recliner chair.   Ambulation/Gait Ambulation/Gait assistance: Min guard Ambulation Distance (Feet): 12 Feet Assistive device: Rolling walker (2 wheeled) Gait Pattern/deviations: Step-through  pattern;Decreased stride length;Shuffle Gait velocity: Decreased Gait velocity interpretation: Below normal speed for age/gender General Gait Details: Pt was able to ambulate around the bed with assist only to manage turns in tight spaces with equipment.    Stairs            Wheelchair Mobility    Modified Rankin (Stroke Patients Only)       Balance Overall balance assessment: History of Falls                                  Cognition Arousal/Alertness: Lethargic Behavior During Therapy: Flat affect Overall Cognitive Status: Within Functional Limits for tasks assessed                      Exercises General Exercises - Lower Extremity Long Arc Quad: 10 reps    General Comments        Pertinent Vitals/Pain Pain Assessment: No/denies pain    Home Living                      Prior Function            PT Goals (current goals can now be found in the care plan section) Acute Rehab PT Goals Patient Stated Goal: Get back in the bed PT Goal Formulation: With patient/family Time For Goal Achievement: 09/04/14 Potential to Achieve Goals: Good Progress towards PT goals: Progressing toward goals    Frequency  Min 3X/week  PT Plan Discharge plan needs to be updated    Co-evaluation             End of Session Equipment Utilized During Treatment: Gait belt;Oxygen Activity Tolerance: Patient limited by fatigue Patient left: in bed;with call bell/phone within reach;with nursing/sitter in room;with family/visitor present     Time: 7680-8811 PT Time Calculation (min) (ACUTE ONLY): 24 min  Charges:  $Gait Training: 8-22 mins $Therapeutic Activity: 8-22 mins                    G Codes:      Rolinda Roan 2014-09-25, 1:12 PM   Rolinda Roan, PT, DPT Acute Rehabilitation Services Pager: [phone]

## 2014-08-29 NOTE — Progress Notes (Signed)
Patient Demographics:    Vincent Sharp, is a 81 y.o. male, DOB - [birth year], CVE:[id number]  Admit date - 08/27/2014   Admitting Physician [name], MD  Outpatient Primary MD for the patient is [name], MD  LOS - 2   Chief Complaint  Patient presents with  . Chest Pain  . Altered Mental Status        Subjective:    Vincent Sharp today has, No headache, No chest pain, No abdominal pain - No Nausea, No new weakness tingling or numbness, No Cough - SOB.  Feels better.   Assessment  & Plan :     1. CAP -  cultures negative so far, continue empiric antiemetics which is Zosyn and doxycycline, no signs of overt aspiration has been cleared by speech therapy. Monitor cultures. Continue supportive care.    2. Elevated liver enzymes. Likely reactionary from #1 above, discussed with patient's gastroenterologist Dr.Madoff reviewed patient's CT scan and ultrasound reports and recommends no further intervention. We will continue to monitor. He is symptom-free from GI standpoint.    3. New onset atrial fibrillation. [place] Vasc score greater than 3. Echocardiogram stable, TSH stable, on amiodarone and beta blocker per cardiology, currently on Lovenox per cardiology and aspirin held, and geology following, will initiate Eliquis in the morning if renal function plateaus and liver enzymes are stable with possible discharge in the morning.  4. Encephalopathy/Delirium with mild underlying dementia. CT shows changes suggestive of chronic ischemic injury with undiagnosed mild vascular dementia, family counseled about delirium, minimize narcotics and benzodiazepines, aspiration and fall precautions.   5. Chronic hydrocele with right epididymitis. For now placed on doxycycline, he's pain and symptom  free, following with urology outpatient.   6. L-spine multilevel spinal stenosis. Chronic. Outpatient follow-up with PCP and neurosurgery if required. Supportive care.   7. Chronic stable thrombocytopenia between 80 200,000 for the last 4 years. Stable monitor. No bleeding issues.    8.CAD. Continue beta blocker, statin and aspirin for now for secondary prevention no acute issues. Currently on Lovenox or atrial fibrillation and aspirin stopped.    Code Status : Full, prognosis guarded  Family Communication  : daughters and wife  Disposition Plan  : TBD  Consults  :  Cardiology, GI Dr. Thana Farr over the phone  Procedures  :   CT scan head. MRI brain nonacute.  MRI L-spine. Chronic changes.  CT abdomen and pelvis. Left lower lobe pneumonia, gallstones without any infection. No liver mass.  Ultrasound abdomen. Possible liver mass which was ruled out by CT scan.  Echocardiogram   - Left ventricle: The cavity size was normal. There was moderateconcentric hypertrophy. Systolic function was normal. Theestimated ejection fraction was in the range of 55% to 60%. Wallmotion was normal; there were no regional wall motionabnormalities. - Aortic valve: Trileaflet; mildly thickened, mildly calcifiedleaflets. Cusp separation was mildly reduced. There was very mildstenosis. Valve area (VTI): 1.76 cm^2. Valve area (Vmax): 2.03cm^2. Valve area (Vmean): 1.87 cm^2. - Mitral valve: There was mild to moderate regurgitation. - Left atrium: The atrium was moderately to severely dilated. - Right ventricle: The cavity size was mildly dilated. Wallthickness was normal. - Right atrium: The atrium was mildly to moderately dilated. - Pulmonary arteries: Systolic pressure  was mildly to moderatelyincreased. PA peak pressure: 41 mm Hg (S). - Pericardium, extracardiac: A trivial pericardial effusion wasidentified.    DVT Prophylaxis  :  Lovenox    Lab Results  Component Value Date   PLT 103*  08/29/2014    Inpatient Medications  Scheduled Meds: . amiodarone  200 mg Oral BID  . atorvastatin  20 mg Oral Q T,Th,S,Su-1800  . atorvastatin  40 mg Oral Q M,W,F-1800  . doxycycline  100 mg Oral Q12H  . enoxaparin (LOVENOX) injection  110 mg Subcutaneous Q12H  . nebivolol  10 mg Oral Daily  . piperacillin-tazobactam (ZOSYN)  IV  3.375 g Intravenous Q8H   Continuous Infusions: . sodium chloride 75 mL/hr at 08/29/14 0820   PRN Meds:.acetaminophen **OR** acetaminophen, diazepam, hydrALAZINE, metoprolol, ondansetron **OR** ondansetron (ZOFRAN) IV  Antibiotics  :     Anti-infectives    Start     Dose/Rate Route Frequency Ordered Stop   08/27/14 1400  doxycycline (VIBRA-TABS) tablet 100 mg     100 mg Oral Every 12 hours 08/27/14 1343     08/27/14 1000  piperacillin-tazobactam (ZOSYN) IVPB 3.375 g     3.375 g 12.5 mL/hr over 240 Minutes Intravenous Every 8 hours 08/27/14 0856     08/27/14 0800  azithromycin (ZITHROMAX) 500 mg in dextrose 5 % 250 mL IVPB  Status:  Discontinued     500 mg 250 mL/hr over 60 Minutes Intravenous Every 24 hours 08/27/14 0548 08/27/14 1343   08/27/14 0600  cefTRIAXone (ROCEPHIN) 1 g in dextrose 5 % 50 mL IVPB - Premix  Status:  Discontinued     1 g 100 mL/hr over 30 Minutes Intravenous Every 24 hours 08/27/14 0548 08/27/14 0812   08/27/14 0215  piperacillin-tazobactam (ZOSYN) IVPB 3.375 g     3.375 g 100 mL/hr over 30 Minutes Intravenous  Once 08/27/14 0202 08/27/14 0415   08/27/14 0215  vancomycin (VANCOCIN) IVPB 1000 mg/200 mL premix     1,000 mg 200 mL/hr over 60 Minutes Intravenous  Once 08/27/14 0202 08/27/14 0514        Objective:   Filed Vitals:   08/28/14 2139 08/29/14 0220 08/29/14 0534 08/29/14 0535  BP: 108/77 120/68 121/63   Pulse: 84 82 89   Temp: 98.3 F (36.8 C) 99.3 F (37.4 C) 98.9 F (37.2 C)   TempSrc: Oral Oral Oral   Resp:      Height:      Weight:    108.41 kg (239 lb)  SpO2: 94% 93% 93%     Wt Readings from Last  3 Encounters:  08/29/14 108.41 kg (239 lb)  12/27/13 115.531 kg (254 lb 11.2 oz)  12/24/13 115.531 kg (254 lb 11.2 oz)     Intake/Output Summary (Last 24 hours) at 08/29/14 1124 Last data filed at 08/29/14 0806  Gross per 24 hour  Intake    720 ml  Output    300 ml  Net    420 ml     Physical Exam  Awake , is confused, No new F.N deficits, Normal affect [place].AT,PERRAL Supple Neck,No JVD, No cervical lymphadenopathy appriciated.  Symmetrical Chest wall movement, Good air movement bilaterally, CTAB iRRR,No Gallops,Rubs or new Murmurs, No Parasternal Heave +ve B.Sounds, Abd Soft, No tenderness, No organomegaly appriciated, No rebound - guarding or rigidity. No Cyanosis, Clubbing or edema, No new Rash or bruise       Data Review:   Micro Results Recent Results (from the past 240 hour(s))  Culture, blood (  routine x 2)     Status: None (Preliminary result)   Collection Time: 08/27/14  2:25 AM  Result Value Ref Range Status   Specimen Description BLOOD RIGHT ANTECUBITAL  Final   Special Requests BOTTLES DRAWN AEROBIC AND ANAEROBIC 10CC EA  Final   Culture NO GROWTH 1 DAY  Final   Report Status PENDING  Incomplete  Culture, blood (routine x 2)     Status: None (Preliminary result)   Collection Time: 08/27/14  2:32 AM  Result Value Ref Range Status   Specimen Description BLOOD RIGHT HAND  Final   Special Requests BOTTLES DRAWN AEROBIC ONLY 10CC  Final   Culture NO GROWTH 1 DAY  Final   Report Status PENDING  Incomplete  Culture, sputum-assessment     Status: None   Collection Time: 08/27/14  5:34 PM  Result Value Ref Range Status   Specimen Description SPUTUM  Final   Special Requests NONE  Final   Sputum evaluation   Final    THIS SPECIMEN IS ACCEPTABLE. RESPIRATORY CULTURE REPORT TO FOLLOW.   Report Status 08/27/2014 FINAL  Final  Culture, respiratory (NON-Expectorated)     Status: None (Preliminary result)   Collection Time: 08/27/14  5:34 PM  Result Value Ref Range  Status   Specimen Description SPUTUM  Final   Special Requests NONE  Final   Gram Stain   Final    FEW WBC PRESENT,BOTH PMN AND MONONUCLEAR NO SQUAMOUS EPITHELIAL CELLS SEEN RARE GRAM POSITIVE COCCI IN PAIRS Performed at [facility]    Culture   Final    NORMAL OROPHARYNGEAL FLORA Performed at [facility]    Report Status PENDING  Incomplete    Radiology Reports Dg Chest 2 View  08/28/2014   CLINICAL DATA:  Shortness of Breath  EXAM: CHEST  2 VIEW  COMPARISON:  08/27/2014  FINDINGS: Prior CABG. Cardiomegaly. Focal opacity in the left lower lobe, slightly progressed since prior study concerning for pneumonia. Right lung is clear. No effusions scratch head no effusions. No acute bony abnormality.  IMPRESSION: Cardiomegaly. Progressive left lower lobe airspace opacity concerning for worsening pneumonia.   Electronically Signed   By: Rolm Baptise M.D.   On: 08/28/2014 10:46   Dg Pelvis 1-2 Views  08/27/2014   CLINICAL DATA:  Scrotal pt.  Right SI pain.  EXAM: PELVIS - 1-2 VIEW  COMPARISON:  None.  FINDINGS: There is no evidence of pelvic fracture or diastasis. No pelvic bone lesions are seen. No significant degenerative changes are present in the SI joints. Bone mineralization is within normal limits.  IMPRESSION: Negative one view pelvis radiograph.   Electronically Signed   By: San Morelle M.D.   On: 08/27/2014 08:38   Ct Head Wo Contrast  08/27/2014   CLINICAL DATA:  Altered mental status.  EXAM: CT HEAD WITHOUT CONTRAST  TECHNIQUE: Contiguous axial images were obtained from the base of the skull through the vertex without intravenous contrast.  COMPARISON:  Brain MRI 09/01/2012  FINDINGS: Skull and Sinuses:Negative for fracture or destructive process. The mastoids, middle ears, and imaged paranasal sinuses are clear.  Orbits: No acute abnormality.  Brain: No acute or remote infarction, hemorrhage, hydrocephalus, or mass lesion/mass effect. Cerebral volume loss, in  keeping with age. No notable white matter disease for age. Intracranial calcified atherosclerosis.  IMPRESSION: No acute findings.  Unremarkable study for age.   Electronically Signed   By: Monte Fantasia M.D.   On: 08/27/2014 03:11   Mr Brain  Wo Contrast  08/27/2014   CLINICAL DATA:  Recent fall.  Confusion.  Gait abnormalities.  EXAM: MRI HEAD WITHOUT CONTRAST  TECHNIQUE: Multiplanar, multiecho pulse sequences of the brain and surrounding structures were obtained without intravenous contrast.  COMPARISON:  MRI brain 09/01/2012.  FINDINGS: No acute infarct, hemorrhage, or mass lesion is present. The ventricles are of normal size. No significant extraaxial fluid collection is present.  Mild periventricular and scattered subcortical T2 hyperintensities are similar to the prior exam.  Flow is present in the major intracranial arteries. The patient is status post bilateral lens replacements. The paranasal sinuses and mastoid air cells are clear.  Skullbase is within normal limits. Midline structures are within normal limits.  IMPRESSION: 1. No acute intracranial abnormality or significant interval change. 2. Stable minimal white matter disease, likely related to chronic microvascular ischemia and essentially within normal limits for age.   Electronically Signed   By: San Morelle M.D.   On: 08/27/2014 08:58   Mr Lumbar Spine Wo Contrast  08/27/2014   CLINICAL DATA:  Fall.  Confusion.  Gait difficulties.  EXAM: MRI LUMBAR SPINE WITHOUT CONTRAST  TECHNIQUE: Multiplanar, multisequence MR imaging of the lumbar spine was performed. No intravenous contrast was administered.  COMPARISON:  None.  FINDINGS: Segmentation: The numbering convention used for this exam termed L5-S1 as the last intervertebral disc space.  Alignment: Gentle levoconvex curve of the lumbar spine which may be positional. The apex is at L4.  Vertebrae: No destructive osseous lesions. Negative for compression fracture. Scattered Schmorl's  nodes are present. Benign hemangioma present at T12.  Conus medullaris: Normal termination at T12-L1.  Paraspinal tissues: Mild infrarenal abdominal aortic ectasia. Paraspinal muscular atrophy which may be secondary to sarcopenia. Cutaneous lesion is present in the RIGHT upper back, likely representing a sebaceous cyst.  Disc levels:  Disc Signal: Diffuse disc desiccation is expected for age.  T12-L1:  Negative.  L1-L2:  Negative.  L2-L3: Shallow circumferential disc bulging without stenosis. Mild ligamentum flavum redundancy.  L3-L4: Moderate multifactorial central stenosis is present. The spinal canal is congenitally narrow, measuring 14 mm AP. There is a broad-based posterior disc bulge and posterior ligamentum flavum redundancy. Narrowing of both lateral recesses is present. There is also mild symmetric bilateral foraminal stenosis predominately due to disc bulging and short pedicles.  L4-L5: Mild central stenosis. Shallow central disc protrusion with central annular fissure. Mild bilateral subarticular stenosis associated with facet arthrosis and bulging disc. Mild bilateral foraminal stenosis.  L5-S1: Disc desiccation but no stenosis. Facet joints appear normal. Central canal, subarticular zones and foramina adequately patent.  IMPRESSION: 1. Negative for compression fracture or acute osseous abnormality in this patient with fall and gait disturbance. 2. L3-L4 moderate multifactorial central stenosis with bilateral subarticular stenosis. 3. L4-L5 mild multifactorial central stenosis. 4. Mild symmetric bilateral foraminal stenosis at L3-L4 and L4-L5.   Electronically Signed   By: Dereck Ligas M.D.   On: 08/27/2014 08:58   US Abdomen Complete  08/27/2014   CLINICAL DATA:  Abnormal liver function tests  EXAM: ULTRASOUND ABDOMEN COMPLETE  COMPARISON:  09/10/2010 abdominal CT  FINDINGS: Gallbladder: The gallbladder is partially contracted around numerous gallstones. Where not obscured there is no wall  thickening. No focal tenderness per sonographer exam.  Common bile duct: Diameter: 2 mm.  Liver: There is a 2 cm echogenic ovoid mass in the central liver which was not seen on noncontrast study from 2012. Antegrade flow in the imaged portal venous system.  IVC: Unremarkable limited visualization.  Pancreas: Visualized portion unremarkable.  Spleen: Size and appearance within normal limits.  Right Kidney: Length: 13 cm. Echogenicity within normal limits. No solid mass or hydronephrosis visualized. An exophytic 27 mm cyst is noted.  Left Kidney: Length: 13 cm. Echogenicity within normal limits. No mass or hydronephrosis visualized.  Abdominal aorta: The proximal and bifurcation is not visible due to bowel gas. No evidence of aneurysm.  IMPRESSION: 1. 2 cm echogenic mass in the central liver, not seen on abdominal CT from 2012. Enhanced liver MRI or abdominal CT is recommended; CT is the more reliable study in the inpatient setting. 2. Cholelithiasis without acute cholecystitis.   Electronically Signed   By: Monte Fantasia M.D.   On: 08/27/2014 04:59   US Scrotum  08/27/2014   CLINICAL DATA:  Bilateral testicular pain for 1 year.  EXAM: SCROTAL ULTRASOUND  DOPPLER ULTRASOUND OF THE TESTICLES  TECHNIQUE: Complete ultrasound examination of the testicles, epididymis, and other scrotal structures was performed. Color and spectral Doppler ultrasound were also utilized to evaluate blood flow to the testicles.  COMPARISON:  CT 09/10/2010.  FINDINGS: Right testicle  Measurements: 3.11.1 x 2.4 cm.  Heterogeneous echotexture.  No mass.  Left testicle  Measurements: 5.0 x 2.2 x 3.8 cm. Heterogeneous echotexture. No mass.  Right epididymis: Prominence of the right epididymis is noted. Epididymitis cannot be excluded.  Left epididymis:  2.5 mm simple cyst.  Hydrocele:  None visualized.  Varicocele: Few prominent veins are noted on the left. Left inguinal hernia with herniation of fat cannot be excluded. Similar finding present  on prior CT of 09/10/2010.  Pulsed Doppler interrogation of both testes demonstrates normal low resistance arterial and venous waveforms bilaterally.  IMPRESSION: 1.  Small left varicocele.  2. Left inguinal hernia with herniation of fat only. Similar finding noted on prior CT of 09/10/2010.  3. Prominence of the right epididymis is noted suggesting epididymitis.   Electronically Signed   By: Marcello Moores  Register   On: 08/27/2014 12:46   Ct Abdomen Pelvis W Contrast  08/27/2014   CLINICAL DATA:  Chest pain, chest congestion and cough beginning 08/26/2014. Altered mental status. Abnormal liver function tests and possible liver mass by ultrasound.  EXAM: CT ABDOMEN AND PELVIS WITH CONTRAST  TECHNIQUE: Multidetector CT imaging of the abdomen and pelvis was performed using the standard protocol following bolus administration of intravenous contrast.  CONTRAST:  145mL OMNIPAQUE IOHEXOL 300 MG/ML  SOLN  COMPARISON:  Abdominal ultrasound earlier today. CT abdomen and pelvis 09/10/2010.  FINDINGS: The patient has small bilateral pleural effusions, larger on the left. Dense airspace disease is partially visualized in the lingula with air bronchograms present. Mild degree of dependent airspace disease is seen in the right lung base. Mild atelectasis on the right is noted. Heart size is mildly enlarged. No pericardial effusion.  No liver mass is identified. Finding on ultrasound may be due to a prominent leaflet of the diaphragm which mildly indents the liver. The spleen, adrenal glands and pancreas are unremarkable. Small exophytic cyst off the lower pole of the right kidney measures 3.0 cm in diameter. Parapelvic cysts on the left are noted. Gallstones without a evidence of cholecystitis are noted.  The patient has fat containing inguinal hernias bilaterally, larger on the left. The prostate gland is mildly prominent. Urinary bladder is unremarkable. Diverticulosis without diverticulitis is noted. Small hiatal hernia is seen.  Small bowel is unremarkable. Aortoiliac atherosclerosis without aneurysm is noted. No lymphadenopathy or fluid. No lytic or sclerotic lesion is identified.  IMPRESSION: Negative for liver mass. Finding on ultrasound may be related to a prominent leaflet of the diaphragm. There is no acute abnormality abdomen or pelvis and no evidence of neoplasm.  Findings consistent with lingular and likely left lower lobe pneumonia.  Diverticulosis without diverticulitis.  Aortoiliac atherosclerosis.  Gallstones without evidence of cholecystitis.  Mild prostatomegaly.  Fat containing inguinal hernias, larger on the left.  Small hiatal hernia.   Electronically Signed   By: Inge Rise M.D.   On: 08/27/2014 13:25   Dg Chest Port 1 View  08/27/2014   CLINICAL DATA:  Acute onset of generalized chest pain. Initial encounter.  EXAM: PORTABLE CHEST - 1 VIEW  COMPARISON:  Chest radiograph performed 12/24/2013  FINDINGS: The lungs are well-aerated. Left basilar airspace opacity may reflect pneumonia. There is no evidence of pleural effusion or pneumothorax.  The cardiomediastinal silhouette is borderline enlarged. The patient is status post median sternotomy. No acute osseous abnormalities are seen.  IMPRESSION: Left basilar airspace opacity may reflect pneumonia. Borderline cardiomegaly.   Electronically Signed   By: Garald Balding M.D.   On: 08/27/2014 01:50   Dg Hip Unilat With Pelvis 2-3 Views Right  08/23/2014   CLINICAL DATA:  Status post fall, with right groin pain. Initial encounter.  EXAM: RIGHT HIP (WITH PELVIS) 2-3 VIEWS  COMPARISON:  None.  FINDINGS: There is no evidence of fracture or dislocation. Minimal cortical irregularity overlying the right greater femoral trochanter is thought to reflect mild degenerative change. Both femoral heads are seated normally within their respective acetabula. The proximal right femur appears intact. Mild degenerative change is noted at the lower lumbar spine. The sacroiliac joints are  unremarkable in appearance.  The visualized bowel gas pattern is grossly unremarkable in appearance. Scattered phleboliths are noted within the pelvis.  IMPRESSION: No evidence of fracture or dislocation.   Electronically Signed   By: Garald Balding M.D.   On: 08/23/2014 23:53     CBC  Recent Labs Lab 08/23/14 2342 08/27/14 0120 08/27/14 0643 08/28/14 0400 08/29/14 0325  WBC 4.8 5.0 3.8* 4.7 4.5  HGB 13.2 14.5 12.8* 14.8 13.0  HCT 38.2* 40.7 35.9* 41.2 36.8*  PLT 69* PLATELET CLUMPS NOTED ON SMEAR, UNABLE TO ESTIMATE 70* 85* 103*  MCV 89.0 88.9 88.6 88.4 89.3  MCH 30.8 31.7 31.6 31.8 31.6  MCHC 34.6 35.6 35.7 35.9 35.3  RDW 12.9 13.3 13.2 13.3 13.2  LYMPHSABS 0.5*  --  0.6*  --   --   MONOABS 0.5  --  0.3  --   --   EOSABS 0.0  --  0.0  --   --   BASOSABS 0.0  --  0.0  --   --     Chemistries   Recent Labs Lab 08/23/14 2342 08/27/14 0120 08/27/14 0643 08/28/14 0400 08/29/14 0325  NA 140 133* 136 135 133*  K 3.9 4.2 3.4* 4.9 4.2  CL 104 100* 101 100* 102  CO2 27 21* 25 24 23   GLUCOSE 126* 117* 119* 104* 98  BUN 23* 25* 27* 20 21*  CREATININE 1.22 1.25* 1.19 1.15 1.32*  CALCIUM 8.9 8.3* 8.0* 8.2* 8.1*  AST 22 107* 79* 121* 91*  ALT 15* 73* 65* 91* 78*  ALKPHOS 83 122 108 144* 125  BILITOT 1.9* 2.8* 2.1* 2.9* 1.6*   ------------------------------------------------------------------------------------------------------------------ estimated creatinine clearance is 57.6 mL/min (by C-G formula based on Cr of 1.32). ------------------------------------------------------------------------------------------------------------------ No results for input(s): HGBA1C in the last 72 hours. ------------------------------------------------------------------------------------------------------------------ No results  for input(s): CHOL, HDL, LDLCALC, TRIG, CHOLHDL, LDLDIRECT in the last 72  hours. ------------------------------------------------------------------------------------------------------------------  Recent Labs  08/28/14 0740  TSH 1.036   ------------------------------------------------------------------------------------------------------------------ No results for input(s): VITAMINB12, FOLATE, FERRITIN, TIBC, IRON, RETICCTPCT in the last 72 hours.  Coagulation profile  Recent Labs Lab 08/27/14 0643 08/28/14 0740  INR 1.19 1.20    No results for input(s): DDIMER in the last 72 hours.  Cardiac Enzymes  Recent Labs Lab 08/27/14 0643 08/27/14 1508 08/27/14 1826  TROPONINI 0.03 0.03 0.03   ------------------------------------------------------------------------------------------------------------------ Invalid input(s): POCBNP   Time Spent in minutes 35   SINGH,PRASHANT K M.D on 08/29/2014 at 11:24 AM  Between 7am to 7pm - Pager - [phone]  After 7pm go to [email or web address] - password [facility]  [facility]   Office  [phone]

## 2014-08-29 NOTE — Progress Notes (Signed)
ANTICOAGULATION CONSULT NOTE - Follow Up Consult  Pharmacy Consult for Enoxparin Indication: atrial fibrillation  No Known Allergies  Patient Measurements: Height: 6\' 1"  (185.4 cm) Weight: 239 lb (108.41 kg) IBW/kg (Calculated) : 79.9  Vital Signs: Temp: 98.9 F (37.2 C) (07/07 0534) Temp Source: Oral (07/07 0534) BP: 121/63 mmHg (07/07 0534) Pulse Rate: 89 (07/07 0534)  Labs:  Recent Labs  08/27/14 0643 08/27/14 1508 08/27/14 1826 08/28/14 0400 08/28/14 0740 08/29/14 0325  HGB 12.8*  --   --  14.8  --  13.0  HCT 35.9*  --   --  41.2  --  36.8*  PLT 70*  --   --  85*  --  103*  LABPROT 15.2  --   --   --  15.3*  --   INR 1.19  --   --   --  1.20  --   CREATININE 1.19  --   --  1.15  --  1.32*  TROPONINI 0.03 0.03 0.03  --   --   --     Estimated Creatinine Clearance: 57.6 mL/min (by C-G formula based on Cr of 1.32).   Assessment: 81 yo M presents on 7/5 with fever, confusion, and chest pain. Found to have new onset Afib. CHADS2VASc score is 4. Cardiology recommending to start Eliquis once he is stable from PNA but currently delirious and so plan to start Lovenox for now. Continue to hold off on starting Eliquis until patients renal function and liver enzymes stable. Hgb stable, plts low at 103 (chronic)  Goal of Therapy:  Anti-Xa level 0.6-1 units/ml 4hrs after LMWH dose given Monitor platelets by anticoagulation protocol: Yes   Plan:  Continue enoxaparin 110mg  SQ Q12  Monitor CBC, s/s of bleed F/U transition to Eliquis  Sulay Brymer J 08/29/2014,11:20 AM

## 2014-08-30 LAB — COMPREHENSIVE METABOLIC PANEL
ALBUMIN: 2.3 g/dL — AB (ref 3.5–5.0)
ALK PHOS: 146 U/L — AB (ref 38–126)
ALT: 128 U/L — AB (ref 17–63)
AST: 168 U/L — ABNORMAL HIGH (ref 15–41)
Anion gap: 8 (ref 5–15)
BILIRUBIN TOTAL: 1.1 mg/dL (ref 0.3–1.2)
BUN: 19 mg/dL (ref 6–20)
CHLORIDE: 103 mmol/L (ref 101–111)
CO2: 23 mmol/L (ref 22–32)
Calcium: 8.1 mg/dL — ABNORMAL LOW (ref 8.9–10.3)
Creatinine, Ser: 1.04 mg/dL (ref 0.61–1.24)
GFR calc Af Amer: >60 mL/min (ref >60)
GFR calc non Af Amer: >60 mL/min (ref >60)
GLUCOSE: 118 mg/dL — AB (ref 65–99)
POTASSIUM: 3.9 mmol/L (ref 3.5–5.1)
SODIUM: 134 mmol/L — AB (ref 135–145)
Total Protein: 5.3 g/dL — ABNORMAL LOW (ref 6.5–8.1)

## 2014-08-30 LAB — CBC
HCT: 37.3 % — ABNORMAL LOW (ref 39.0–52.0)
HEMOGLOBIN: 13.3 g/dL (ref 13.0–17.0)
MCH: 31.4 pg (ref 26.0–34.0)
MCHC: 35.7 g/dL (ref 30.0–36.0)
MCV: 88.2 fL (ref 78.0–100.0)
Platelets: 112 10*3/uL — ABNORMAL LOW (ref 150–400)
RBC: 4.23 MIL/uL (ref 4.22–5.81)
RDW: 13.1 % (ref 11.5–15.5)
WBC: 3.9 10*3/uL — ABNORMAL LOW (ref 4.0–10.5)

## 2014-08-30 LAB — CULTURE, RESPIRATORY W GRAM STAIN

## 2014-08-30 LAB — CULTURE, RESPIRATORY: Culture: NORMAL

## 2014-08-30 MED ORDER — DIAZEPAM 5 MG PO TABS: 5.0000 mg | ORAL_TABLET | Freq: Four times a day (QID) | ORAL | Status: DC | PRN

## 2014-08-30 MED ORDER — AMIODARONE HCL 200 MG PO TABS: 200.0000 mg | ORAL_TABLET | Freq: Two times a day (BID) | ORAL | Status: DC

## 2014-08-30 MED ORDER — APIXABAN 5 MG PO TABS: 5.0000 mg | ORAL_TABLET | Freq: Two times a day (BID) | ORAL | Status: DC

## 2014-08-30 MED ORDER — APIXABAN 5 MG PO TABS
5.0000 mg | ORAL_TABLET | Freq: Two times a day (BID) | ORAL | Status: DC
  Administered 2014-08-30 – 2014-08-31 (×3): 5 mg via ORAL
  Filled 2014-08-30 (×4): qty 1

## 2014-08-30 MED ORDER — RAMIPRIL 2.5 MG PO CAPS: 2.5000 mg | ORAL_CAPSULE | Freq: Every day | ORAL | Status: DC

## 2014-08-30 MED ORDER — OXYCODONE-ACETAMINOPHEN 5-325 MG PO TABS: 1.0000 | ORAL_TABLET | ORAL | Status: DC | PRN

## 2014-08-30 MED ORDER — CETYLPYRIDINIUM CHLORIDE 0.05 % MT LIQD
7.0000 mL | Freq: Two times a day (BID) | OROMUCOSAL | Status: DC
  Administered 2014-08-30 – 2014-08-31 (×3): 7 mL via OROMUCOSAL

## 2014-08-30 MED ORDER — DOXYCYCLINE HYCLATE 100 MG PO TABS: 100.0000 mg | ORAL_TABLET | Freq: Two times a day (BID) | ORAL | Status: DC

## 2014-08-30 NOTE — Progress Notes (Signed)
ANTICOAGULATION CONSULT NOTE - Follow Up Consult  Pharmacy Consult for Enoxaparin to Apixaban Indication: atrial fibrillation  No Known Allergies  Patient Measurements: Height: 6\' 1"  (185.4 cm) Weight: 239 lb (108.41 kg) IBW/kg (Calculated) : 79.9  Vital Signs: Temp: 97.5 F (36.4 C) (07/08 0524) Temp Source: Oral (07/08 0524) BP: 131/71 mmHg (07/08 0524) Pulse Rate: 68 (07/08 0524)  Labs:  Recent Labs  08/27/14 1508 08/27/14 1826  08/28/14 0400 08/28/14 0740 08/29/14 0325 08/30/14 0250  HGB  --   --   < > 14.8  --  13.0 13.3  HCT  --   --   --  41.2  --  36.8* 37.3*  PLT  --   --   --  85*  --  103* 112*  LABPROT  --   --   --   --  15.3*  --   --   INR  --   --   --   --  1.20  --   --   CREATININE  --   --   --  1.15  --  1.32* 1.04  TROPONINI 0.03 0.03  --   --   --   --   --   < > = values in this interval not displayed.  Estimated Creatinine Clearance: 73.2 mL/min (by C-G formula based on Cr of 1.04).  Assessment: 80 yo M presents on 7/5 with fever, confusion, and chest pain. Found to have new onset Afib. CHADS2VASc score is 4. Cardiology recommending to start Eliquis now that he is more stable.SCr is down to 1.04, CrCl ~85ml/min. Hgb stable, plts low but trending up to 112 (chronic)  Goal of Therapy:  Monitor platelets by anticoagulation protocol: Yes   Plan:  Stop enoxaparin 110mg  SQ Q12 Start apixaban 5mg  PO BID at 0800 today Monitor CBC, s/s of bleed  Alfa Leibensperger J 08/30/2014,7:56 AM

## 2014-08-30 NOTE — Progress Notes (Signed)
Pt family chooses [facility]- bed available tomorrow (7/9)- family plan to sign paperowork at 4:30 at facility.  fl2 on chart  CSW will continue to follow.  Domenica Reamer, [facility] Social Worker [phone]

## 2014-08-30 NOTE — Care Management (Addendum)
Discharge plan for SNF 08/31/14 per CSW.

## 2014-08-30 NOTE — Progress Notes (Addendum)
Subjective:  Patient says he doesn't feel good.  Still with significant delirium.  Daughter reports he develops wheezing and increased disorientation at night.  Knows my name but not oriented to place.  The fever is down now.  Remains in atrial fibrillation.  Objective:  Vital Signs in the last 24 hours: BP 131/71 mmHg  Pulse 68  Temp(Src) 97.5 F (36.4 C) (Oral)  Resp 18  Ht 6\' 1"  (1.854 m)  Wt 108.41 kg (239 lb)  BMI 31.54 kg/m2  SpO2 94%  Physical Exam: Pleasant male in no acute distress sitting up in bed side still mildly disoriented Lungs: Reduced breath sounds left base Cardiac:  irregular rhythm, normal S1 and S2, no S3 Extremities:  No edema present  Intake/Output from previous day: 07/07 0701 - 07/08 0700 In: 1205 [P.O.:480; I.V.:725] Out: 876 [Urine:875; Stool:1]  Weight Filed Weights   08/27/14 0156 08/29/14 0535  Weight: 113.399 kg (250 lb) 108.41 kg (239 lb)    Lab Results: Basic Metabolic Panel:  Recent Labs  08/29/14 0325 08/30/14 0250  NA 133* 134*  K 4.2 3.9  CL 102 103  CO2 23 23  GLUCOSE 98 118*  BUN 21* 19  CREATININE 1.32* 1.04   CBC:  Recent Labs  08/29/14 0325 08/30/14 0250  WBC 4.5 3.9*  HGB 13.0 13.3  HCT 36.8* 37.3*  MCV 89.3 88.2  PLT 103* 112*   Cardiac Enzymes:  Recent Labs  08/27/14 1508 08/27/14 1826  TROPONINI 0.03 0.03    Telemetry: Atrial fibrillation with controlled response  Assessment/Plan:  1. Paroxysmal atrial fibrillation with recent onset 2. Hypertensive heart disease 3. Delirium persists but calm her 4. Coronary artery disease with previous bypass grafting without angina 5. Thombocytopenia stable 6.  Pneumonia   Recommendations:  May change to Eliquis today.  His aspirin should be discontinued.  I would discharge him on amiodarone 200 mg twice a day and I will need to see him in the office after he gets out of the skilled nursing facility.  I would ideally like to see him in the next 3  weeks.      Kerry Hough  MD [facility]  08/30/2014, 8:15 AM

## 2014-08-30 NOTE — Progress Notes (Signed)
Vincent Sharp, is a 81 y.o. male  DOB 01/05/1933  MRN [id number].  Admission date:  08/27/2014  Admitting Physician  [name], MD  Discharge Date:  08/30/2014   Primary MD  ARONSON,RICHARD A, MD  Recommendations for primary care physician for things to follow:   Repeat CBC, CMP and a 2 view chest x-ray and awake.  Needs outpatient cardiology follow-up with Dr. Wynonia Lawman in 2-3 weeks. Urology follow-up in the same timeframe as well.   Admission Diagnosis  Confusion [R41.0] Community acquired pneumonia [J18.9] Abnormal LFTs [R79.89] Chest pain, unspecified chest pain type [R07.9]   Discharge Diagnosis  Confusion [R41.0] Community acquired pneumonia [J18.9] Abnormal LFTs [R79.89] Chest pain, unspecified chest pain type [R07.9]     Principal Problem:   Community acquired pneumonia Active Problems:   CAD (coronary artery disease), native coronary artery   S/P CABG (coronary artery bypass graft)   Pneumonia   Abnormal LFTs   Acute encephalopathy   ARF (acute renal failure)   Chest pain   History of colon cancer      Past Medical History  Diagnosis Date  . Hypertension   . Hyperlipidemia   . Depression   . Colon cancer     skin, colon  . OSA on CPAP   . Obesity hypoventilation syndrome   . Complex sleep apnea syndrome 05/18/2013  . Coronary artery disease   . Pneumonia     as a child    Past Surgical History  Procedure Laterality Date  . Polypectomy  1989  . Hydrocele excision / repair    . Cataract extraction Bilateral   . Coronary artery bypass graft  1992    x3, Dr Redmond Pulling surgeon,  Dr Wynonia Lawman cardiologist  . Tonsillectomy    . Eye surgery    . Hernia repair      right inguinal  . Knee arthroscopy w/ meniscal repair Right   . Shoulder arthroscopy with rotator cuff repair and  subacromial decompression Right 12/27/2013    Procedure: RIGHT SHOULDER ARTHROSCOPY SUBACROMIAL DECOMPRESSION DISTAL CLAVICLE RESECTION AND ROTATOR CUFF REPAIR, BISCEPS TENOTOMY;  Surgeon: Marin Shutter, MD;  Location: [facility];  Service: Orthopedics;  Laterality: Right;  . Partial colectomy         HPI  from the history and physical done on the day of admission:     Vincent Sharp is a 81 y.o. male with history of CAD status post CABG, hypertension and hyperlipidemia OSA was brought to the ER after patient was found confused with fever and chest pain. Patient was brought to the ER 3 days ago after a fall while trying to stand up from a sitting position. At that time patient did not hit his head or lose consciousness but did hurt his back. Since then patient has been having incontinence of urine and difficulty walking with gait difficulties. Patient has been also having persistent cough. Yesterday patient around evening complained of some chest pain and chest congestion following which patient became confused and febrile.  Patient was brought to the ER. Chest x-ray shows pneumonic process in the left side. EKG and cardiac markers were negative. Patient still complains of low back pain and has difficulty walking. Patient's LFTs are found to be elevated and sonogram shows mass in the liver which will need further workup. Patient's mental status has improved after admission. Patient has been admitted for further management.     Hospital Course:     1. CAP - cultures negative, was treated with empiric antiebiotics which is Zosyn and doxycycline, no signs of overt aspiration has been cleared by speech therapy. He became much improved will be placed on 7 days of oral doxycycline. Continue feeding assistance aspiration precautions. Continue nebulizer treatments if needed, 2L nasal cannula oxygen if pulse ox drops below 90%.   2. Elevated liver enzymes. Likely reactionary from #1 above, discussed with  patient's gastroenterologist Dr.Madoff reviewed patient's CT scan and ultrasound reports and recommends no further intervention. We will continue to monitor. He is symptom-free from GI standpoint. Repeat CMP in a week.   3. New onset atrial fibrillation. [place] Vasc score greater than 3. Echocardiogram stable, TSH stable, on amiodarone and beta blocker per cardiology, seen by patient's cardiologist Dr. Wynonia Lawman placed on Eliquis. Follow with Dr. Wynonia Lawman in 2-3 weeks.   4. Encephalopathy/Delirium with mild underlying dementia. CT shows changes suggestive of chronic ischemic injury with undiagnosed mild vascular dementia, family counseled about delirium, minimize narcotics and benzodiazepines, aspiration and fall precautions.Delirium is much improved.   5. Chronic hydrocele with ? right epididymitis. For now placed on doxycycline, he's pain and symptom free, following with urology outpatient.   6. L-spine multilevel spinal stenosis. Chronic. Outpatient follow-up with PCP and neurosurgery if required. Supportive care.   7. Chronic stable thrombocytopenia between 80 200,000 for the last 4 years. Stable monitor. No bleeding issues.    8.CAD. Continue beta blocker, statin & Eliquisfor now for secondary prevention no acute issues.      Discharge Condition: Stable  Follow UP  Follow-up Information    Follow up with ARONSON,RICHARD A, MD. Schedule an appointment as soon as possible for a visit in 1 week.   Specialty:  Internal Medicine   Contact information:   [address] [place] [address] [phone]       Follow up with Alexis Frock, MD. Schedule an appointment as soon as possible for a visit in 1 week.   Specialty:  Urology   Why:  Epididymitis   Contact information:   [place] [address] [phone]       Follow up with Ezzard Standing, MD In 3 weeks.   Specialty:  Cardiology   Contact information:   [place]  [address] [phone]        [place] and Activity recommendation: See Discharge Instructions below  Discharge Instructions           Discharge Instructions    Diet - low sodium heart healthy    Complete by:  As directed      Discharge instructions    Complete by:  As directed   Follow with Primary MD ARONSON,RICHARD A, MD in 7 days   Get CBC, CMP, 2 view Chest X ray checked  by Primary MD next visit.    Activity: As tolerated with Full fall precautions use walker/cane & assistance as needed   Disposition SNF   Diet: Heart Healthy  with feeding  assistance and aspiration precautions.  For Heart failure patients - Check your Weight same time everyday, if you gain over 2 pounds, or you develop in leg swelling, experience more shortness of breath or chest pain, call your Primary MD immediately. Follow Cardiac Low Salt Diet and 1.5 lit/day fluid restriction.   On your next visit with your primary care physician please Get Medicines reviewed and adjusted.   Please request your Prim.MD to go over all Hospital Tests and Procedure/Radiological results at the follow up, please get all Hospital records sent to your Prim MD by signing hospital release before you go home.   If you experience worsening of your admission symptoms, develop shortness of breath, life threatening emergency, suicidal or homicidal thoughts you must seek medical attention immediately by calling 911 or calling your MD immediately  if symptoms less severe.  You Must read complete instructions/literature along with all the possible adverse reactions/side effects for all the Medicines you take and that have been prescribed to you. Take any new Medicines after you have completely understood and accpet all the possible adverse reactions/side effects.   Do not drive, operating heavy machinery, perform activities at heights, swimming or participation in water activities or provide baby  sitting services if your were admitted for syncope or siezures until you have seen by Primary MD or a Neurologist and advised to do so again.  Do not drive when taking Pain medications.    Do not take more than prescribed Pain, Sleep and Anxiety Medications  Special Instructions: If you have smoked or chewed Tobacco  in the last 2 yrs please stop smoking, stop any regular Alcohol  and or any Recreational drug use.  Wear Seat belts while driving.   Please note  You were cared for by a hospitalist during your hospital stay. If you have any questions about your discharge medications or the care you received while you were in the hospital after you are discharged, you can call the unit and asked to speak with the hospitalist on call if the hospitalist that took care of you is not available. Once you are discharged, your primary care physician will handle any further medical issues. Please note that NO REFILLS for any discharge medications will be authorized once you are discharged, as it is imperative that you return to your primary care physician (or establish a relationship with a primary care physician if you do not have one) for your aftercare needs so that they can reassess your need for medications and monitor your lab values.     Increase activity slowly    Complete by:  As directed              Discharge Medications       Medication List    STOP taking these medications        aspirin 81 MG tablet     naproxen sodium 220 MG tablet  Commonly known as:  ANAPROX      TAKE these medications        amiodarone 200 MG tablet  Commonly known as:  PACERONE  Take 1 tablet (200 mg total) by mouth 2 (two) times daily.     apixaban 5 MG Tabs tablet  Commonly known as:  ELIQUIS  Take 1 tablet (5 mg total) by mouth 2 (two) times daily.     atorvastatin 20 MG tablet  Commonly known as:  LIPITOR  Take 20-40 mg by mouth daily. Take 40 mg every mon, wed, and  Friday and 20 mg all  other days     diazepam 5 MG tablet  Commonly known as:  VALIUM  Take 1 tablet (5 mg total) by mouth every 6 (six) hours as needed for anxiety.     doxycycline 100 MG tablet  Commonly known as:  VIBRA-TABS  Take 1 tablet (100 mg total) by mouth every 12 (twelve) hours. 1 more week     Fish Oil 300 MG Caps  Take 1 capsule by mouth 2 (two) times daily.     MULTI-VITAMIN PO  Take 1 tablet by mouth daily.     nebivolol 10 MG tablet  Commonly known as:  BYSTOLIC  Take 10 mg by mouth daily.     oxyCODONE-acetaminophen 5-325 MG per tablet  Commonly known as:  PERCOCET  Take 1 tablet by mouth every 4 (four) hours as needed.     ramipril 2.5 MG capsule  Commonly known as:  ALTACE  Take 1 capsule (2.5 mg total) by mouth daily.        Major procedures and Radiology Reports - PLEASE review detailed and final reports for all details, in brief -   CT scan head. MRI brain nonacute.  MRI L-spine. Chronic changes.  CT abdomen and pelvis. Left lower lobe pneumonia, gallstones without any infection. No liver mass.  Ultrasound abdomen. Possible liver mass which was ruled out by CT scan.  Echocardiogram   - Left ventricle: The cavity size was normal. There was moderateconcentric hypertrophy. Systolic function was normal. Theestimated ejection fraction was in the range of 55% to 60%. Wallmotion was normal; there were no regional wall motionabnormalities. - Aortic valve: Trileaflet; mildly thickened, mildly calcifiedleaflets. Cusp separation was mildly reduced. There was very mildstenosis. Valve area (VTI): 1.76 cm^2. Valve area (Vmax): 2.03cm^2. Valve area (Vmean): 1.87 cm^2. - Mitral valve: There was mild to moderate regurgitation. - Left atrium: The atrium was moderately to severely dilated. - Right ventricle: The cavity size was mildly dilated. Wallthickness was normal. - Right atrium: The atrium was mildly to moderately dilated. - Pulmonary arteries: Systolic pressure was  mildly to moderatelyincreased. PA peak pressure: 41 mm Hg (S). - Pericardium, extracardiac: A trivial pericardial effusion wasidentified.   Dg Chest 2 View  08/28/2014   CLINICAL DATA:  Shortness of Breath  EXAM: CHEST  2 VIEW  COMPARISON:  08/27/2014  FINDINGS: Prior CABG. Cardiomegaly. Focal opacity in the left lower lobe, slightly progressed since prior study concerning for pneumonia. Right lung is clear. No effusions scratch head no effusions. No acute bony abnormality.  IMPRESSION: Cardiomegaly. Progressive left lower lobe airspace opacity concerning for worsening pneumonia.   Electronically Signed   By: Rolm Baptise M.D.   On: 08/28/2014 10:46   Dg Pelvis 1-2 Views  08/27/2014   CLINICAL DATA:  Scrotal pt.  Right SI pain.  EXAM: PELVIS - 1-2 VIEW  COMPARISON:  None.  FINDINGS: There is no evidence of pelvic fracture or diastasis. No pelvic bone lesions are seen. No significant degenerative changes are present in the SI joints. Bone mineralization is within normal limits.  IMPRESSION: Negative one view pelvis radiograph.   Electronically Signed   By: San Morelle M.D.   On: 08/27/2014 08:38   Ct Head Wo Contrast  08/27/2014   CLINICAL DATA:  Altered mental status.  EXAM: CT HEAD WITHOUT CONTRAST  TECHNIQUE: Contiguous axial images were obtained from the base of the skull through the vertex without intravenous contrast.  COMPARISON:  Brain MRI 09/01/2012  FINDINGS:  Skull and Sinuses:Negative for fracture or destructive process. The mastoids, middle ears, and imaged paranasal sinuses are clear.  Orbits: No acute abnormality.  Brain: No acute or remote infarction, hemorrhage, hydrocephalus, or mass lesion/mass effect. Cerebral volume loss, in keeping with age. No notable white matter disease for age. Intracranial calcified atherosclerosis.  IMPRESSION: No acute findings.  Unremarkable study for age.   Electronically Signed   By: Monte Fantasia M.D.   On: 08/27/2014 03:11   Mr Brain Wo  Contrast  08/27/2014   CLINICAL DATA:  Recent fall.  Confusion.  Gait abnormalities.  EXAM: MRI HEAD WITHOUT CONTRAST  TECHNIQUE: Multiplanar, multiecho pulse sequences of the brain and surrounding structures were obtained without intravenous contrast.  COMPARISON:  MRI brain 09/01/2012.  FINDINGS: No acute infarct, hemorrhage, or mass lesion is present. The ventricles are of normal size. No significant extraaxial fluid collection is present.  Mild periventricular and scattered subcortical T2 hyperintensities are similar to the prior exam.  Flow is present in the major intracranial arteries. The patient is status post bilateral lens replacements. The paranasal sinuses and mastoid air cells are clear.  Skullbase is within normal limits. Midline structures are within normal limits.  IMPRESSION: 1. No acute intracranial abnormality or significant interval change. 2. Stable minimal white matter disease, likely related to chronic microvascular ischemia and essentially within normal limits for age.   Electronically Signed   By: San Morelle M.D.   On: 08/27/2014 08:58   Mr Lumbar Spine Wo Contrast  08/27/2014   CLINICAL DATA:  Fall.  Confusion.  Gait difficulties.  EXAM: MRI LUMBAR SPINE WITHOUT CONTRAST  TECHNIQUE: Multiplanar, multisequence MR imaging of the lumbar spine was performed. No intravenous contrast was administered.  COMPARISON:  None.  FINDINGS: Segmentation: The numbering convention used for this exam termed L5-S1 as the last intervertebral disc space.  Alignment: Gentle levoconvex curve of the lumbar spine which may be positional. The apex is at L4.  Vertebrae: No destructive osseous lesions. Negative for compression fracture. Scattered Schmorl's nodes are present. Benign hemangioma present at T12.  Conus medullaris: Normal termination at T12-L1.  Paraspinal tissues: Mild infrarenal abdominal aortic ectasia. Paraspinal muscular atrophy which may be secondary to sarcopenia. Cutaneous lesion is  present in the RIGHT upper back, likely representing a sebaceous cyst.  Disc levels:  Disc Signal: Diffuse disc desiccation is expected for age.  T12-L1:  Negative.  L1-L2:  Negative.  L2-L3: Shallow circumferential disc bulging without stenosis. Mild ligamentum flavum redundancy.  L3-L4: Moderate multifactorial central stenosis is present. The spinal canal is congenitally narrow, measuring 14 mm AP. There is a broad-based posterior disc bulge and posterior ligamentum flavum redundancy. Narrowing of both lateral recesses is present. There is also mild symmetric bilateral foraminal stenosis predominately due to disc bulging and short pedicles.  L4-L5: Mild central stenosis. Shallow central disc protrusion with central annular fissure. Mild bilateral subarticular stenosis associated with facet arthrosis and bulging disc. Mild bilateral foraminal stenosis.  L5-S1: Disc desiccation but no stenosis. Facet joints appear normal. Central canal, subarticular zones and foramina adequately patent.  IMPRESSION: 1. Negative for compression fracture or acute osseous abnormality in this patient with fall and gait disturbance. 2. L3-L4 moderate multifactorial central stenosis with bilateral subarticular stenosis. 3. L4-L5 mild multifactorial central stenosis. 4. Mild symmetric bilateral foraminal stenosis at L3-L4 and L4-L5.   Electronically Signed   By: Dereck Ligas M.D.   On: 08/27/2014 08:58   US Abdomen Complete  08/27/2014   CLINICAL DATA:  Abnormal liver function tests  EXAM: ULTRASOUND ABDOMEN COMPLETE  COMPARISON:  09/10/2010 abdominal CT  FINDINGS: Gallbladder: The gallbladder is partially contracted around numerous gallstones. Where not obscured there is no wall thickening. No focal tenderness per sonographer exam.  Common bile duct: Diameter: 2 mm.  Liver: There is a 2 cm echogenic ovoid mass in the central liver which was not seen on noncontrast study from 2012. Antegrade flow in the imaged portal venous system.   IVC: Unremarkable limited visualization.  Pancreas: Visualized portion unremarkable.  Spleen: Size and appearance within normal limits.  Right Kidney: Length: 13 cm. Echogenicity within normal limits. No solid mass or hydronephrosis visualized. An exophytic 27 mm cyst is noted.  Left Kidney: Length: 13 cm. Echogenicity within normal limits. No mass or hydronephrosis visualized.  Abdominal aorta: The proximal and bifurcation is not visible due to bowel gas. No evidence of aneurysm.  IMPRESSION: 1. 2 cm echogenic mass in the central liver, not seen on abdominal CT from 2012. Enhanced liver MRI or abdominal CT is recommended; CT is the more reliable study in the inpatient setting. 2. Cholelithiasis without acute cholecystitis.   Electronically Signed   By: Monte Fantasia M.D.   On: 08/27/2014 04:59   US Scrotum  08/27/2014   CLINICAL DATA:  Bilateral testicular pain for 1 year.  EXAM: SCROTAL ULTRASOUND  DOPPLER ULTRASOUND OF THE TESTICLES  TECHNIQUE: Complete ultrasound examination of the testicles, epididymis, and other scrotal structures was performed. Color and spectral Doppler ultrasound were also utilized to evaluate blood flow to the testicles.  COMPARISON:  CT 09/10/2010.  FINDINGS: Right testicle  Measurements: 3.11.1 x 2.4 cm.  Heterogeneous echotexture.  No mass.  Left testicle  Measurements: 5.0 x 2.2 x 3.8 cm. Heterogeneous echotexture. No mass.  Right epididymis: Prominence of the right epididymis is noted. Epididymitis cannot be excluded.  Left epididymis:  2.5 mm simple cyst.  Hydrocele:  None visualized.  Varicocele: Few prominent veins are noted on the left. Left inguinal hernia with herniation of fat cannot be excluded. Similar finding present on prior CT of 09/10/2010.  Pulsed Doppler interrogation of both testes demonstrates normal low resistance arterial and venous waveforms bilaterally.  IMPRESSION: 1.  Small left varicocele.  2. Left inguinal hernia with herniation of fat only. Similar  finding noted on prior CT of 09/10/2010.  3. Prominence of the right epididymis is noted suggesting epididymitis.   Electronically Signed   By: Marcello Moores  Register   On: 08/27/2014 12:46   Ct Abdomen Pelvis W Contrast  08/27/2014   CLINICAL DATA:  Chest pain, chest congestion and cough beginning 08/26/2014. Altered mental status. Abnormal liver function tests and possible liver mass by ultrasound.  EXAM: CT ABDOMEN AND PELVIS WITH CONTRAST  TECHNIQUE: Multidetector CT imaging of the abdomen and pelvis was performed using the standard protocol following bolus administration of intravenous contrast.  CONTRAST:  110mL OMNIPAQUE IOHEXOL 300 MG/ML  SOLN  COMPARISON:  Abdominal ultrasound earlier today. CT abdomen and pelvis 09/10/2010.  FINDINGS: The patient has small bilateral pleural effusions, larger on the left. Dense airspace disease is partially visualized in the lingula with air bronchograms present. Mild degree of dependent airspace disease is seen in the right lung base. Mild atelectasis on the right is noted. Heart size is mildly enlarged. No pericardial effusion.  No liver mass is identified. Finding on ultrasound may be due to a prominent leaflet of the diaphragm which mildly indents the liver. The spleen, adrenal glands and pancreas are unremarkable. Small  exophytic cyst off the lower pole of the right kidney measures 3.0 cm in diameter. Parapelvic cysts on the left are noted. Gallstones without a evidence of cholecystitis are noted.  The patient has fat containing inguinal hernias bilaterally, larger on the left. The prostate gland is mildly prominent. Urinary bladder is unremarkable. Diverticulosis without diverticulitis is noted. Small hiatal hernia is seen. Small bowel is unremarkable. Aortoiliac atherosclerosis without aneurysm is noted. No lymphadenopathy or fluid. No lytic or sclerotic lesion is identified.  IMPRESSION: Negative for liver mass. Finding on ultrasound may be related to a prominent  leaflet of the diaphragm. There is no acute abnormality abdomen or pelvis and no evidence of neoplasm.  Findings consistent with lingular and likely left lower lobe pneumonia.  Diverticulosis without diverticulitis.  Aortoiliac atherosclerosis.  Gallstones without evidence of cholecystitis.  Mild prostatomegaly.  Fat containing inguinal hernias, larger on the left.  Small hiatal hernia.   Electronically Signed   By: Inge Rise M.D.   On: 08/27/2014 13:25   Dg Chest Port 1 View  08/27/2014   CLINICAL DATA:  Acute onset of generalized chest pain. Initial encounter.  EXAM: PORTABLE CHEST - 1 VIEW  COMPARISON:  Chest radiograph performed 12/24/2013  FINDINGS: The lungs are well-aerated. Left basilar airspace opacity may reflect pneumonia. There is no evidence of pleural effusion or pneumothorax.  The cardiomediastinal silhouette is borderline enlarged. The patient is status post median sternotomy. No acute osseous abnormalities are seen.  IMPRESSION: Left basilar airspace opacity may reflect pneumonia. Borderline cardiomegaly.   Electronically Signed   By: Garald Balding M.D.   On: 08/27/2014 01:50   Dg Hip Unilat With Pelvis 2-3 Views Right  08/23/2014   CLINICAL DATA:  Status post fall, with right groin pain. Initial encounter.  EXAM: RIGHT HIP (WITH PELVIS) 2-3 VIEWS  COMPARISON:  None.  FINDINGS: There is no evidence of fracture or dislocation. Minimal cortical irregularity overlying the right greater femoral trochanter is thought to reflect mild degenerative change. Both femoral heads are seated normally within their respective acetabula. The proximal right femur appears intact. Mild degenerative change is noted at the lower lumbar spine. The sacroiliac joints are unremarkable in appearance.  The visualized bowel gas pattern is grossly unremarkable in appearance. Scattered phleboliths are noted within the pelvis.  IMPRESSION: No evidence of fracture or dislocation.   Electronically Signed   By: Garald Balding M.D.   On: 08/23/2014 23:53    Micro Results     Recent Results (from the past 240 hour(s))  Culture, blood (routine x 2)     Status: None (Preliminary result)   Collection Time: 08/27/14  2:25 AM  Result Value Ref Range Status   Specimen Description BLOOD RIGHT ANTECUBITAL  Final   Special Requests BOTTLES DRAWN AEROBIC AND ANAEROBIC 10CC EA  Final   Culture NO GROWTH 2 DAYS  Final   Report Status PENDING  Incomplete  Culture, blood (routine x 2)     Status: None (Preliminary result)   Collection Time: 08/27/14  2:32 AM  Result Value Ref Range Status   Specimen Description BLOOD RIGHT HAND  Final   Special Requests BOTTLES DRAWN AEROBIC ONLY 10CC  Final   Culture NO GROWTH 2 DAYS  Final   Report Status PENDING  Incomplete  Culture, sputum-assessment     Status: None   Collection Time: 08/27/14  5:34 PM  Result Value Ref Range Status   Specimen Description SPUTUM  Final   Special Requests NONE  Final   Sputum evaluation   Final    THIS SPECIMEN IS ACCEPTABLE. RESPIRATORY CULTURE REPORT TO FOLLOW.   Report Status 08/27/2014 FINAL  Final  Culture, respiratory (NON-Expectorated)     Status: None   Collection Time: 08/27/14  5:34 PM  Result Value Ref Range Status   Specimen Description SPUTUM  Final   Special Requests NONE  Final   Gram Stain   Final    FEW WBC PRESENT,BOTH PMN AND MONONUCLEAR NO SQUAMOUS EPITHELIAL CELLS SEEN RARE GRAM POSITIVE COCCI IN PAIRS Performed at [facility]    Culture   Final    NORMAL OROPHARYNGEAL FLORA Performed at [facility]    Report Status 08/30/2014 FINAL  Final  Clostridium Difficile by PCR (not at [facility])     Status: None   Collection Time: 08/29/14  5:33 PM  Result Value Ref Range Status   C difficile by pcr NEGATIVE NEGATIVE Final       Today   Subjective    Vincent Sharp today has no headache,no chest abdominal pain,no new weakness tingling or numbness, feels much better.   Objective   Blood  pressure 122/68, pulse 74, temperature 97.5 F (36.4 C), temperature source Oral, resp. rate 18, height 6\' 1"  (1.854 m), weight 108.41 kg (239 lb), SpO2 96 %.   Intake/Output Summary (Last 24 hours) at 08/30/14 1227 Last data filed at 08/30/14 0525  Gross per 24 hour  Intake    965 ml  Output    876 ml  Net     89 ml    Exam Awake Alert, Oriented x 3, No new F.N deficits, Normal affect [place].AT,PERRAL Supple Neck,No JVD, No cervical lymphadenopathy appriciated.  Symmetrical Chest wall movement, Good air movement bilaterally, CTAB RRR,No Gallops,Rubs or new Murmurs, No Parasternal Heave +ve B.Sounds, Abd Soft, Non tender, No organomegaly appriciated, No rebound -guarding or rigidity. No Cyanosis, Clubbing or edema, No new Rash or bruise   Data Review   CBC w Diff:  Lab Results  Component Value Date   WBC 3.9* 08/30/2014   HGB 13.3 08/30/2014   HCT 37.3* 08/30/2014   PLT 112* 08/30/2014   LYMPHOPCT 14 08/27/2014   MONOPCT 9 08/27/2014   EOSPCT 0 08/27/2014   BASOPCT 0 08/27/2014    CMP:  Lab Results  Component Value Date   NA 134* 08/30/2014   K 3.9 08/30/2014   CL 103 08/30/2014   CO2 23 08/30/2014   BUN 19 08/30/2014   CREATININE 1.04 08/30/2014   PROT 5.3* 08/30/2014   ALBUMIN 2.3* 08/30/2014   BILITOT 1.1 08/30/2014   ALKPHOS 146* 08/30/2014   AST 168* 08/30/2014   ALT 128* 08/30/2014  .   Total Time in preparing paper work, data evaluation and todays exam - 35 minutes  Thurnell Lose M.D on 08/30/2014 at 12:27 PM  [facility]   Office  [phone]

## 2014-08-30 NOTE — Discharge Instructions (Signed)
Follow with Primary MD ARONSON,RICHARD A, MD in 7 days   Get CBC, CMP, 2 view Chest X ray checked  by Primary MD next visit.    Activity: As tolerated with Full fall precautions use walker/cane & assistance as needed   Disposition SNF   Diet: Heart Healthy  with feeding assistance and aspiration precautions.  For Heart failure patients - Check your Weight same time everyday, if you gain over 2 pounds, or you develop in leg swelling, experience more shortness of breath or chest pain, call your Primary MD immediately. Follow Cardiac Low Salt Diet and 1.5 lit/day fluid restriction.   On your next visit with your primary care physician please Get Medicines reviewed and adjusted.   Please request your Prim.MD to go over all Hospital Tests and Procedure/Radiological results at the follow up, please get all Hospital records sent to your Prim MD by signing hospital release before you go home.   If you experience worsening of your admission symptoms, develop shortness of breath, life threatening emergency, suicidal or homicidal thoughts you must seek medical attention immediately by calling 911 or calling your MD immediately  if symptoms less severe.  You Must read complete instructions/literature along with all the possible adverse reactions/side effects for all the Medicines you take and that have been prescribed to you. Take any new Medicines after you have completely understood and accpet all the possible adverse reactions/side effects.   Do not drive, operating heavy machinery, perform activities at heights, swimming or participation in water activities or provide baby sitting services if your were admitted for syncope or siezures until you have seen by Primary MD or a Neurologist and advised to do so again.  Do not drive when taking Pain medications.    Do not take more than prescribed Pain, Sleep and Anxiety Medications  Special Instructions: If you have smoked or chewed Tobacco  in  the last 2 yrs please stop smoking, stop any regular Alcohol  and or any Recreational drug use.  Wear Seat belts while driving.   Please note  You were cared for by a hospitalist during your hospital stay. If you have any questions about your discharge medications or the care you received while you were in the hospital after you are discharged, you can call the unit and asked to speak with the hospitalist on call if the hospitalist that took care of you is not available. Once you are discharged, your primary care physician will handle any further medical issues. Please note that NO REFILLS for any discharge medications will be authorized once you are discharged, as it is imperative that you return to your primary care physician (or establish a relationship with a primary care physician if you do not have one) for your aftercare needs so that they can reassess your need for medications and monitor your lab values.  Information on my medicine - ELIQUIS (apixaban)  This medication education was reviewed with me or my healthcare representative as part of my discharge preparation.  The pharmacist that spoke with me during my hospital stay was:  Reginia Naas, [facility]  Why was Eliquis prescribed for you? Eliquis was prescribed for you to reduce the risk of forming blood clots that can cause a stroke if you have a medical condition called atrial fibrillation (a type of irregular heartbeat) OR to reduce the risk of a blood clots forming after orthopedic surgery.  What do You need to know about Eliquis ? Take your Eliquis TWICE DAILY -  one tablet in the morning and one tablet in the evening with or without food.  It would be best to take the doses about the same time each day.  If you have difficulty swallowing the tablet whole please discuss with your pharmacist how to take the medication safely.  Take Eliquis exactly as prescribed by your doctor and DO NOT stop taking Eliquis without talking  to the doctor who prescribed the medication.  Stopping may increase your risk of developing a new clot or stroke.  Refill your prescription before you run out.  After discharge, you should have regular check-up appointments with your healthcare provider that is prescribing your Eliquis.  In the future your dose may need to be changed if your kidney function or weight changes by a significant amount or as you get older.  What do you do if you miss a dose? If you miss a dose, take it as soon as you remember on the same day and resume taking twice daily.  Do not take more than one dose of ELIQUIS at the same time.  Important Safety Information A possible side effect of Eliquis is bleeding. You should call your healthcare provider right away if you experience any of the following: ? Bleeding from an injury or your nose that does not stop. ? Unusual colored urine (red or dark brown) or unusual colored stools (red or black). ? Unusual bruising for unknown reasons. ? A serious fall or if you hit your head (even if there is no bleeding).  Some medicines may interact with Eliquis and might increase your risk of bleeding or clotting while on Eliquis. To help avoid this, consult your healthcare provider or pharmacist prior to using any new prescription or non-prescription medications, including herbals, vitamins, non-steroidal anti-inflammatory drugs (NSAIDs) and supplements.  This website has more information on Eliquis (apixaban): [email or web address].no.

## 2014-08-31 LAB — COMPREHENSIVE METABOLIC PANEL
ALT: 156 U/L — ABNORMAL HIGH (ref 17–63)
AST: 194 U/L — AB (ref 15–41)
Albumin: 2.3 g/dL — ABNORMAL LOW (ref 3.5–5.0)
Alkaline Phosphatase: 139 U/L — ABNORMAL HIGH (ref 38–126)
Anion gap: 7 (ref 5–15)
BILIRUBIN TOTAL: 1 mg/dL (ref 0.3–1.2)
BUN: 17 mg/dL (ref 6–20)
CHLORIDE: 104 mmol/L (ref 101–111)
CO2: 27 mmol/L (ref 22–32)
CREATININE: 1.02 mg/dL (ref 0.61–1.24)
Calcium: 8.5 mg/dL — ABNORMAL LOW (ref 8.9–10.3)
GFR calc Af Amer: >60 mL/min (ref >60)
GFR calc non Af Amer: >60 mL/min (ref >60)
Glucose, Bld: 108 mg/dL — ABNORMAL HIGH (ref 65–99)
POTASSIUM: 4.4 mmol/L (ref 3.5–5.1)
Sodium: 138 mmol/L (ref 135–145)
Total Protein: 5.5 g/dL — ABNORMAL LOW (ref 6.5–8.1)

## 2014-08-31 LAB — CBC
HCT: 36.8 % — ABNORMAL LOW (ref 39.0–52.0)
HEMOGLOBIN: 13 g/dL (ref 13.0–17.0)
MCH: 31 pg (ref 26.0–34.0)
MCHC: 35.3 g/dL (ref 30.0–36.0)
MCV: 87.8 fL (ref 78.0–100.0)
PLATELETS: 145 10*3/uL — AB (ref 150–400)
RBC: 4.19 MIL/uL — AB (ref 4.22–5.81)
RDW: 13.1 % (ref 11.5–15.5)
WBC: 3.4 10*3/uL — ABNORMAL LOW (ref 4.0–10.5)

## 2014-08-31 MED ORDER — QUETIAPINE FUMARATE 25 MG PO TABS
25.0000 mg | ORAL_TABLET | Freq: Two times a day (BID) | ORAL | Status: DC
  Administered 2014-08-31: 25 mg via ORAL
  Filled 2014-08-31 (×2): qty 1

## 2014-08-31 MED ORDER — DIAZEPAM 5 MG PO TABS: 5.0000 mg | ORAL_TABLET | Freq: Two times a day (BID) | ORAL | Status: DC | PRN

## 2014-08-31 MED ORDER — HALOPERIDOL 2 MG PO TABS
2.0000 mg | ORAL_TABLET | Freq: Once | ORAL | Status: AC
  Administered 2014-08-31: 2 mg via ORAL
  Filled 2014-08-31: qty 1

## 2014-08-31 MED ORDER — HALOPERIDOL LACTATE 5 MG/ML IJ SOLN: 2.0000 mg | Freq: Once | INTRAMUSCULAR | Status: DC

## 2014-08-31 MED ORDER — QUETIAPINE FUMARATE 25 MG PO TABS: 25.0000 mg | ORAL_TABLET | Freq: Two times a day (BID) | ORAL | Status: DC

## 2014-08-31 NOTE — Progress Notes (Signed)
Family concerned about patient discharge today, Dr Candiss Norse Made aware, Social work in to see patient and family, Hospital house coverage also made aware of situation and patient family given number for office of patient experience. Will monitor patient. Vincent Sharp, Bettina Gavia RN

## 2014-08-31 NOTE — Progress Notes (Signed)
08/31/2014 3:28 PM Report called to [place].

## 2014-08-31 NOTE — Care Management (Signed)
ED CM spoke with Dr. Coralyn Pear who was consulted patient, patient and family has agreed to go to [facility].  Lorriane Shire CSW  was updated.

## 2014-08-31 NOTE — Clinical Social Work Note (Addendum)
CSW weekend handoff indicates that patient will d/c to [facility] today (Sat., 7/9). Current attending has placed a discharge order and completed discharge summary for patient to d/c to skilled facility [facility]). CSW talked with daughters who were at the bedside (outside of the room) regarding discharge. Daughters expressed concerns about patient's discharge from a medical perspective and requested to speak to another MD regarding patient's medical status, however did not want current attending contacted to have another MD speak with them or evaluate patient. Daughters also requested not to speak with this CSW any further. During interaction with daughters, Nathaniel Man, Clinical Social Work Surveyor, quantity and Dr. Reynaldo Minium, [place] Director were consulted by phone.  Dr. Candiss Norse was contacted regarding daughter's request and another physician talked with family. Nurse case manager Lelon Frohlich contacted and updated regarding patient's readiness for discharge.  CSW informed later that patient will discharge today to [place] contacted and d/c summary transmitted to admissions staff. Patient will be transported to facility by ambulance.    Diane Mochizuki Givens, MSW, LCSW Licensed Clinical Social Worker [facility] [phone]

## 2014-08-31 NOTE — Discharge Summary (Addendum)
Vincent Sharp, is a 81 y.o. male  DOB [birth year]  MRN [id number].  Admission date:  08/27/2014  Admitting Physician  [name], MD  Discharge Date:  08/31/2014   Primary MD  ARONSON,RICHARD A, MD  Recommendations for primary care physician for things to follow:   Repeat CBC, CMP and a 2 view chest x-ray and awake.  Needs outpatient cardiology follow-up with Dr. Wynonia Lawman in 2-3 weeks. Urology follow-up in the same timeframe as well.   Admission Diagnosis  Confusion [R41.0] Community acquired pneumonia [J18.9] Abnormal LFTs [R79.89] Chest pain, unspecified chest pain type [R07.9]   Discharge Diagnosis  Confusion [R41.0] Community acquired pneumonia [J18.9] Abnormal LFTs [R79.89] Chest pain, unspecified chest pain type [R07.9]     Principal Problem:   Community acquired pneumonia Active Problems:   CAD (coronary artery disease), native coronary artery   S/P CABG (coronary artery bypass graft)   Pneumonia   Abnormal LFTs   Acute encephalopathy   ARF (acute renal failure)   Chest pain   History of colon cancer      Past Medical History  Diagnosis Date  . Hypertension   . Hyperlipidemia   . Depression   . Colon cancer     skin, colon  . OSA on CPAP   . Obesity hypoventilation syndrome   . Complex sleep apnea syndrome 05/18/2013  . Coronary artery disease   . Pneumonia     as a child    Past Surgical History  Procedure Laterality Date  . Polypectomy  1989  . Hydrocele excision / repair    . Cataract extraction Bilateral   . Coronary artery bypass graft  1992    x3, Dr Redmond Pulling surgeon,  Dr Wynonia Lawman cardiologist  . Tonsillectomy    . Eye surgery    . Hernia repair      right inguinal  . Knee arthroscopy w/ meniscal repair Right   . Shoulder arthroscopy with rotator cuff repair and  subacromial decompression Right 12/27/2013    Procedure: RIGHT SHOULDER ARTHROSCOPY SUBACROMIAL DECOMPRESSION DISTAL CLAVICLE RESECTION AND ROTATOR CUFF REPAIR, BISCEPS TENOTOMY;  Surgeon: Marin Shutter, MD;  Location: [facility];  Service: Orthopedics;  Laterality: Right;  . Partial colectomy         HPI  from the history and physical done on the day of admission:     Vincent Sharp is a 81 y.o. male with history of CAD status post CABG, hypertension and hyperlipidemia OSA was brought to the ER after patient was found confused with fever and chest pain. Patient was brought to the ER 3 days ago after a fall while trying to stand up from a sitting position. At that time patient did not hit his head or lose consciousness but did hurt his back. Since then patient has been having incontinence of urine and difficulty walking with gait difficulties. Patient has been also having persistent cough. Yesterday patient around evening complained of some chest pain and chest congestion following which patient became confused and febrile.  Patient was brought to the ER. Chest x-ray shows pneumonic process in the left side. EKG and cardiac markers were negative. Patient still complains of low back pain and has difficulty walking. Patient's LFTs are found to be elevated and sonogram shows mass in the liver which will need further workup. Patient's mental status has improved after admission. Patient has been admitted for further management.     Hospital Course:     1. CAP - cultures negative, was treated with empiric antiebiotics which is Zosyn and doxycycline, no signs of overt aspiration has been cleared by speech therapy. He became much improved will be placed on 7 days of oral doxycycline. Continue feeding assistance aspiration precautions. Continue nebulizer treatments if needed, 2L nasal cannula oxygen if pulse ox drops below 90%.   2. Elevated liver enzymes. Likely reactionary from #1 above, discussed with  patient's gastroenterologist Dr.Madoff reviewed patient's CT scan and ultrasound reports and recommends no further intervention. We will continue to monitor. He is symptom-free from GI standpoint. Repeat CMP in a week.   3. New onset atrial fibrillation. [place] Vasc score greater than 3. Echocardiogram stable, TSH stable, on amiodarone and beta blocker per cardiology, seen by patient's cardiologist Dr. Wynonia Lawman placed on Eliquis. Follow with Dr. Wynonia Lawman in 2-3 weeks.   4. Encephalopathy/Delirium with mild underlying dementia. CT shows changes suggestive of chronic ischemic injury with undiagnosed mild vascular dementia, family counseled about delirium, minimize narcotics and benzodiazepines, aspiration and fall precautions. Delirium is much improved. He is oriented 2, following commands, in good spirits, patient and family clearly explained that delirium will come and go while he is in the hospital or any other unfamiliar setting like SNF.  They do have an option of taking him home as recommended by physical therapy with 24-hour supervision, I have told that with this option delirium will improve quickly as he will be back in his familiar settings. Family informs them that they cannot provide 24-hour supervision and will take him to SNF. Placed on Seroquel low dose, changed home Valium to BID PRN only, minimize benzo use.     5. Chronic hydrocele with ? right epididymitis. For now placed on doxycycline, he's pain and symptom free, following with urology outpatient.   6. L-spine multilevel spinal stenosis. Chronic. Outpatient follow-up with PCP and neurosurgery if required. Supportive care.   7. Chronic stable thrombocytopenia between 80 200,000 for the last 4 years. Stable monitor. No bleeding issues.    8.CAD. Continue beta blocker, statin & Eliquisfor now for secondary prevention no acute issues.     9.Mild Antibiotic induced diarrhea - C diff negative, 1 BM in 24 hrs, much better.    Note  patient was technically discharged yesterday, extensive discussions with family, case Freight forwarder and Education officer, museum. Patient has a approve bed at SNF. However daughter wants him to stay here until they get her preferred bed at a different SNF. I have deferred this to Education officer, museum. I have had over 30 minutes of discussion with family every day in regards to his disposition.  Recommendations per physical therapy were either SNF or home with family supervision, family says they cannot provide supervision. In that case placement has been referred to Education officer, museum.  2nd opinion was requested by the family and graciously done by Dr Coralyn Pear     Discharge Condition: Stable  Follow UP  Follow-up Information    Follow up with ARONSON,RICHARD A, MD. Schedule an appointment as soon as possible for a visit in 1 week.  Specialty:  Internal Medicine   Contact information:   [address] [place] [address] [phone]       Follow up with Alexis Frock, MD. Schedule an appointment as soon as possible for a visit in 1 week.   Specialty:  Urology   Why:  Epididymitis   Contact information:   [place] [address] [phone]       Follow up with Ezzard Standing, MD In 3 weeks.   Specialty:  Cardiology   Contact information:   [place] [address] [phone]        [place] and Activity recommendation: See Discharge Instructions below  Discharge Instructions           Discharge Instructions    Diet - low sodium heart healthy    Complete by:  As directed      Discharge instructions    Complete by:  As directed   Follow with Primary MD ARONSON,RICHARD A, MD in 7 days   Get CBC, CMP, 2 view Chest X ray checked  by Primary MD next visit.    Activity: As tolerated with Full fall precautions use walker/cane & assistance as needed   Disposition SNF   Diet: Heart Healthy  with feeding  assistance and aspiration precautions.  For Heart failure patients - Check your Weight same time everyday, if you gain over 2 pounds, or you develop in leg swelling, experience more shortness of breath or chest pain, call your Primary MD immediately. Follow Cardiac Low Salt Diet and 1.5 lit/day fluid restriction.   On your next visit with your primary care physician please Get Medicines reviewed and adjusted.   Please request your Prim.MD to go over all Hospital Tests and Procedure/Radiological results at the follow up, please get all Hospital records sent to your Prim MD by signing hospital release before you go home.   If you experience worsening of your admission symptoms, develop shortness of breath, life threatening emergency, suicidal or homicidal thoughts you must seek medical attention immediately by calling 911 or calling your MD immediately  if symptoms less severe.  You Must read complete instructions/literature along with all the possible adverse reactions/side effects for all the Medicines you take and that have been prescribed to you. Take any new Medicines after you have completely understood and accpet all the possible adverse reactions/side effects.   Do not drive, operating heavy machinery, perform activities at heights, swimming or participation in water activities or provide baby sitting services if your were admitted for syncope or siezures until you have seen by Primary MD or a Neurologist and advised to do so again.  Do not drive when taking Pain medications.    Do not take more than prescribed Pain, Sleep and Anxiety Medications  Special Instructions: If you have smoked or chewed Tobacco  in the last 2 yrs please stop smoking, stop any regular Alcohol  and or any Recreational drug use.  Wear Seat belts while driving.   Please note  You were cared for by a hospitalist during your hospital stay. If you have any questions about your discharge medications or the care  you received while you were in the hospital after you are discharged, you can call the unit and asked to speak with the hospitalist on call if the hospitalist that took care of you is not available. Once you are discharged, your primary care physician will handle  any further medical issues. Please note that NO REFILLS for any discharge medications will be authorized once you are discharged, as it is imperative that you return to your primary care physician (or establish a relationship with a primary care physician if you do not have one) for your aftercare needs so that they can reassess your need for medications and monitor your lab values.     Increase activity slowly    Complete by:  As directed              Discharge Medications       Medication List    STOP taking these medications        aspirin 81 MG tablet     naproxen sodium 220 MG tablet  Commonly known as:  ANAPROX      TAKE these medications        amiodarone 200 MG tablet  Commonly known as:  PACERONE  Take 1 tablet (200 mg total) by mouth 2 (two) times daily.     apixaban 5 MG Tabs tablet  Commonly known as:  ELIQUIS  Take 1 tablet (5 mg total) by mouth 2 (two) times daily.     atorvastatin 20 MG tablet  Commonly known as:  LIPITOR  Take 20-40 mg by mouth daily. Take 40 mg every mon, wed, and Friday and 20 mg all other days     diazepam 5 MG tablet  Commonly known as:  VALIUM  Take 1 tablet (5 mg total) by mouth every 12 (twelve) hours as needed for anxiety.     doxycycline 100 MG tablet  Commonly known as:  VIBRA-TABS  Take 1 tablet (100 mg total) by mouth every 12 (twelve) hours. 1 more week     Fish Oil 300 MG Caps  Take 1 capsule by mouth 2 (two) times daily.     MULTI-VITAMIN PO  Take 1 tablet by mouth daily.     nebivolol 10 MG tablet  Commonly known as:  BYSTOLIC  Take 10 mg by mouth daily.     oxyCODONE-acetaminophen 5-325 MG per tablet  Commonly known as:  PERCOCET  Take 1 tablet by  mouth every 4 (four) hours as needed.     QUEtiapine 25 MG tablet  Commonly known as:  SEROQUEL  Take 1 tablet (25 mg total) by mouth 2 (two) times daily.     ramipril 2.5 MG capsule  Commonly known as:  ALTACE  Take 1 capsule (2.5 mg total) by mouth daily.        Major procedures and Radiology Reports - PLEASE review detailed and final reports for all details, in brief -   CT scan head. MRI brain nonacute.  MRI L-spine. Chronic changes.  CT abdomen and pelvis. Left lower lobe pneumonia, gallstones without any infection. No liver mass.  Ultrasound abdomen. Possible liver mass which was ruled out by CT scan.  Echocardiogram   - Left ventricle: The cavity size was normal. There was moderateconcentric hypertrophy. Systolic function was normal. Theestimated ejection fraction was in the range of 55% to 60%. Wallmotion was normal; there were no regional wall motionabnormalities. - Aortic valve: Trileaflet; mildly thickened, mildly calcifiedleaflets. Cusp separation was mildly reduced. There was very mildstenosis. Valve area (VTI): 1.76 cm^2. Valve area (Vmax): 2.03cm^2. Valve area (Vmean): 1.87 cm^2. - Mitral valve: There was mild to moderate regurgitation. - Left atrium: The atrium was moderately to severely dilated. - Right ventricle: The cavity size was mildly dilated. Wallthickness was normal. -  Right atrium: The atrium was mildly to moderately dilated. - Pulmonary arteries: Systolic pressure was mildly to moderatelyincreased. PA peak pressure: 41 mm Hg (S). - Pericardium, extracardiac: A trivial pericardial effusion wasidentified.   Dg Chest 2 View  08/28/2014   CLINICAL DATA:  Shortness of Breath  EXAM: CHEST  2 VIEW  COMPARISON:  08/27/2014  FINDINGS: Prior CABG. Cardiomegaly. Focal opacity in the left lower lobe, slightly progressed since prior study concerning for pneumonia. Right lung is clear. No effusions scratch head no effusions. No acute bony abnormality.   IMPRESSION: Cardiomegaly. Progressive left lower lobe airspace opacity concerning for worsening pneumonia.   Electronically Signed   By: Rolm Baptise M.D.   On: 08/28/2014 10:46   Dg Pelvis 1-2 Views  08/27/2014   CLINICAL DATA:  Scrotal pt.  Right SI pain.  EXAM: PELVIS - 1-2 VIEW  COMPARISON:  None.  FINDINGS: There is no evidence of pelvic fracture or diastasis. No pelvic bone lesions are seen. No significant degenerative changes are present in the SI joints. Bone mineralization is within normal limits.  IMPRESSION: Negative one view pelvis radiograph.   Electronically Signed   By: San Morelle M.D.   On: 08/27/2014 08:38   Ct Head Wo Contrast  08/27/2014   CLINICAL DATA:  Altered mental status.  EXAM: CT HEAD WITHOUT CONTRAST  TECHNIQUE: Contiguous axial images were obtained from the base of the skull through the vertex without intravenous contrast.  COMPARISON:  Brain MRI 09/01/2012  FINDINGS: Skull and Sinuses:Negative for fracture or destructive process. The mastoids, middle ears, and imaged paranasal sinuses are clear.  Orbits: No acute abnormality.  Brain: No acute or remote infarction, hemorrhage, hydrocephalus, or mass lesion/mass effect. Cerebral volume loss, in keeping with age. No notable white matter disease for age. Intracranial calcified atherosclerosis.  IMPRESSION: No acute findings.  Unremarkable study for age.   Electronically Signed   By: Monte Fantasia M.D.   On: 08/27/2014 03:11   Mr Brain Wo Contrast  08/27/2014   CLINICAL DATA:  Recent fall.  Confusion.  Gait abnormalities.  EXAM: MRI HEAD WITHOUT CONTRAST  TECHNIQUE: Multiplanar, multiecho pulse sequences of the brain and surrounding structures were obtained without intravenous contrast.  COMPARISON:  MRI brain 09/01/2012.  FINDINGS: No acute infarct, hemorrhage, or mass lesion is present. The ventricles are of normal size. No significant extraaxial fluid collection is present.  Mild periventricular and scattered subcortical  T2 hyperintensities are similar to the prior exam.  Flow is present in the major intracranial arteries. The patient is status post bilateral lens replacements. The paranasal sinuses and mastoid air cells are clear.  Skullbase is within normal limits. Midline structures are within normal limits.  IMPRESSION: 1. No acute intracranial abnormality or significant interval change. 2. Stable minimal white matter disease, likely related to chronic microvascular ischemia and essentially within normal limits for age.   Electronically Signed   By: San Morelle M.D.   On: 08/27/2014 08:58   Mr Lumbar Spine Wo Contrast  08/27/2014   CLINICAL DATA:  Fall.  Confusion.  Gait difficulties.  EXAM: MRI LUMBAR SPINE WITHOUT CONTRAST  TECHNIQUE: Multiplanar, multisequence MR imaging of the lumbar spine was performed. No intravenous contrast was administered.  COMPARISON:  None.  FINDINGS: Segmentation: The numbering convention used for this exam termed L5-S1 as the last intervertebral disc space.  Alignment: Gentle levoconvex curve of the lumbar spine which may be positional. The apex is at L4.  Vertebrae: No destructive osseous lesions. Negative for  compression fracture. Scattered Schmorl's nodes are present. Benign hemangioma present at T12.  Conus medullaris: Normal termination at T12-L1.  Paraspinal tissues: Mild infrarenal abdominal aortic ectasia. Paraspinal muscular atrophy which may be secondary to sarcopenia. Cutaneous lesion is present in the RIGHT upper back, likely representing a sebaceous cyst.  Disc levels:  Disc Signal: Diffuse disc desiccation is expected for age.  T12-L1:  Negative.  L1-L2:  Negative.  L2-L3: Shallow circumferential disc bulging without stenosis. Mild ligamentum flavum redundancy.  L3-L4: Moderate multifactorial central stenosis is present. The spinal canal is congenitally narrow, measuring 14 mm AP. There is a broad-based posterior disc bulge and posterior ligamentum flavum redundancy.  Narrowing of both lateral recesses is present. There is also mild symmetric bilateral foraminal stenosis predominately due to disc bulging and short pedicles.  L4-L5: Mild central stenosis. Shallow central disc protrusion with central annular fissure. Mild bilateral subarticular stenosis associated with facet arthrosis and bulging disc. Mild bilateral foraminal stenosis.  L5-S1: Disc desiccation but no stenosis. Facet joints appear normal. Central canal, subarticular zones and foramina adequately patent.  IMPRESSION: 1. Negative for compression fracture or acute osseous abnormality in this patient with fall and gait disturbance. 2. L3-L4 moderate multifactorial central stenosis with bilateral subarticular stenosis. 3. L4-L5 mild multifactorial central stenosis. 4. Mild symmetric bilateral foraminal stenosis at L3-L4 and L4-L5.   Electronically Signed   By: Dereck Ligas M.D.   On: 08/27/2014 08:58   US Abdomen Complete  08/27/2014   CLINICAL DATA:  Abnormal liver function tests  EXAM: ULTRASOUND ABDOMEN COMPLETE  COMPARISON:  09/10/2010 abdominal CT  FINDINGS: Gallbladder: The gallbladder is partially contracted around numerous gallstones. Where not obscured there is no wall thickening. No focal tenderness per sonographer exam.  Common bile duct: Diameter: 2 mm.  Liver: There is a 2 cm echogenic ovoid mass in the central liver which was not seen on noncontrast study from 2012. Antegrade flow in the imaged portal venous system.  IVC: Unremarkable limited visualization.  Pancreas: Visualized portion unremarkable.  Spleen: Size and appearance within normal limits.  Right Kidney: Length: 13 cm. Echogenicity within normal limits. No solid mass or hydronephrosis visualized. An exophytic 27 mm cyst is noted.  Left Kidney: Length: 13 cm. Echogenicity within normal limits. No mass or hydronephrosis visualized.  Abdominal aorta: The proximal and bifurcation is not visible due to bowel gas. No evidence of aneurysm.   IMPRESSION: 1. 2 cm echogenic mass in the central liver, not seen on abdominal CT from 2012. Enhanced liver MRI or abdominal CT is recommended; CT is the more reliable study in the inpatient setting. 2. Cholelithiasis without acute cholecystitis.   Electronically Signed   By: Monte Fantasia M.D.   On: 08/27/2014 04:59   US Scrotum  08/27/2014   CLINICAL DATA:  Bilateral testicular pain for 1 year.  EXAM: SCROTAL ULTRASOUND  DOPPLER ULTRASOUND OF THE TESTICLES  TECHNIQUE: Complete ultrasound examination of the testicles, epididymis, and other scrotal structures was performed. Color and spectral Doppler ultrasound were also utilized to evaluate blood flow to the testicles.  COMPARISON:  CT 09/10/2010.  FINDINGS: Right testicle  Measurements: 3.11.1 x 2.4 cm.  Heterogeneous echotexture.  No mass.  Left testicle  Measurements: 5.0 x 2.2 x 3.8 cm. Heterogeneous echotexture. No mass.  Right epididymis: Prominence of the right epididymis is noted. Epididymitis cannot be excluded.  Left epididymis:  2.5 mm simple cyst.  Hydrocele:  None visualized.  Varicocele: Few prominent veins are noted on the left. Left inguinal hernia  with herniation of fat cannot be excluded. Similar finding present on prior CT of 09/10/2010.  Pulsed Doppler interrogation of both testes demonstrates normal low resistance arterial and venous waveforms bilaterally.  IMPRESSION: 1.  Small left varicocele.  2. Left inguinal hernia with herniation of fat only. Similar finding noted on prior CT of 09/10/2010.  3. Prominence of the right epididymis is noted suggesting epididymitis.   Electronically Signed   By: Marcello Moores  Register   On: 08/27/2014 12:46   Ct Abdomen Pelvis W Contrast  08/27/2014   CLINICAL DATA:  Chest pain, chest congestion and cough beginning 08/26/2014. Altered mental status. Abnormal liver function tests and possible liver mass by ultrasound.  EXAM: CT ABDOMEN AND PELVIS WITH CONTRAST  TECHNIQUE: Multidetector CT imaging of the  abdomen and pelvis was performed using the standard protocol following bolus administration of intravenous contrast.  CONTRAST:  112mL OMNIPAQUE IOHEXOL 300 MG/ML  SOLN  COMPARISON:  Abdominal ultrasound earlier today. CT abdomen and pelvis 09/10/2010.  FINDINGS: The patient has small bilateral pleural effusions, larger on the left. Dense airspace disease is partially visualized in the lingula with air bronchograms present. Mild degree of dependent airspace disease is seen in the right lung base. Mild atelectasis on the right is noted. Heart size is mildly enlarged. No pericardial effusion.  No liver mass is identified. Finding on ultrasound may be due to a prominent leaflet of the diaphragm which mildly indents the liver. The spleen, adrenal glands and pancreas are unremarkable. Small exophytic cyst off the lower pole of the right kidney measures 3.0 cm in diameter. Parapelvic cysts on the left are noted. Gallstones without a evidence of cholecystitis are noted.  The patient has fat containing inguinal hernias bilaterally, larger on the left. The prostate gland is mildly prominent. Urinary bladder is unremarkable. Diverticulosis without diverticulitis is noted. Small hiatal hernia is seen. Small bowel is unremarkable. Aortoiliac atherosclerosis without aneurysm is noted. No lymphadenopathy or fluid. No lytic or sclerotic lesion is identified.  IMPRESSION: Negative for liver mass. Finding on ultrasound may be related to a prominent leaflet of the diaphragm. There is no acute abnormality abdomen or pelvis and no evidence of neoplasm.  Findings consistent with lingular and likely left lower lobe pneumonia.  Diverticulosis without diverticulitis.  Aortoiliac atherosclerosis.  Gallstones without evidence of cholecystitis.  Mild prostatomegaly.  Fat containing inguinal hernias, larger on the left.  Small hiatal hernia.   Electronically Signed   By: Inge Rise M.D.   On: 08/27/2014 13:25   Dg Chest Port 1  View  08/27/2014   CLINICAL DATA:  Acute onset of generalized chest pain. Initial encounter.  EXAM: PORTABLE CHEST - 1 VIEW  COMPARISON:  Chest radiograph performed 12/24/2013  FINDINGS: The lungs are well-aerated. Left basilar airspace opacity may reflect pneumonia. There is no evidence of pleural effusion or pneumothorax.  The cardiomediastinal silhouette is borderline enlarged. The patient is status post median sternotomy. No acute osseous abnormalities are seen.  IMPRESSION: Left basilar airspace opacity may reflect pneumonia. Borderline cardiomegaly.   Electronically Signed   By: Garald Balding M.D.   On: 08/27/2014 01:50   Dg Hip Unilat With Pelvis 2-3 Views Right  08/23/2014   CLINICAL DATA:  Status post fall, with right groin pain. Initial encounter.  EXAM: RIGHT HIP (WITH PELVIS) 2-3 VIEWS  COMPARISON:  None.  FINDINGS: There is no evidence of fracture or dislocation. Minimal cortical irregularity overlying the right greater femoral trochanter is thought to reflect mild degenerative change. Both  femoral heads are seated normally within their respective acetabula. The proximal right femur appears intact. Mild degenerative change is noted at the lower lumbar spine. The sacroiliac joints are unremarkable in appearance.  The visualized bowel gas pattern is grossly unremarkable in appearance. Scattered phleboliths are noted within the pelvis.  IMPRESSION: No evidence of fracture or dislocation.   Electronically Signed   By: Garald Balding M.D.   On: 08/23/2014 23:53    Micro Results     Recent Results (from the past 240 hour(s))  Culture, blood (routine x 2)     Status: None (Preliminary result)   Collection Time: 08/27/14  2:25 AM  Result Value Ref Range Status   Specimen Description BLOOD RIGHT ANTECUBITAL  Final   Special Requests BOTTLES DRAWN AEROBIC AND ANAEROBIC 10CC EA  Final   Culture NO GROWTH 4 DAYS  Final   Report Status PENDING  Incomplete  Culture, blood (routine x 2)     Status:  None (Preliminary result)   Collection Time: 08/27/14  2:32 AM  Result Value Ref Range Status   Specimen Description BLOOD RIGHT HAND  Final   Special Requests BOTTLES DRAWN AEROBIC ONLY 10CC  Final   Culture NO GROWTH 4 DAYS  Final   Report Status PENDING  Incomplete  Culture, sputum-assessment     Status: None   Collection Time: 08/27/14  5:34 PM  Result Value Ref Range Status   Specimen Description SPUTUM  Final   Special Requests NONE  Final   Sputum evaluation   Final    THIS SPECIMEN IS ACCEPTABLE. RESPIRATORY CULTURE REPORT TO FOLLOW.   Report Status 08/27/2014 FINAL  Final  Culture, respiratory (NON-Expectorated)     Status: None   Collection Time: 08/27/14  5:34 PM  Result Value Ref Range Status   Specimen Description SPUTUM  Final   Special Requests NONE  Final   Gram Stain   Final    FEW WBC PRESENT,BOTH PMN AND MONONUCLEAR NO SQUAMOUS EPITHELIAL CELLS SEEN RARE GRAM POSITIVE COCCI IN PAIRS Performed at [facility]    Culture   Final    NORMAL OROPHARYNGEAL FLORA Performed at [facility]    Report Status 08/30/2014 FINAL  Final  Clostridium Difficile by PCR (not at [facility])     Status: None   Collection Time: 08/29/14  5:33 PM  Result Value Ref Range Status   C difficile by pcr NEGATIVE NEGATIVE Final       Today   Subjective    [name] today has no headache,no chest abdominal pain,no new weakness tingling or numbness, feels much better.   Objective   Blood pressure 138/80, pulse 75, temperature 98.5 F (36.9 C), temperature source Oral, resp. rate 20, height 6\' 1"  (1.854 m), weight 108.41 kg (239 lb), SpO2 91 %.  No intake or output data in the 24 hours ending 08/31/14 1407  Exam Awake Alert, Oriented x 2, No new F.N deficits, Normal affect [place].AT,PERRAL Supple Neck,No JVD, No cervical lymphadenopathy appriciated.  Symmetrical Chest wall movement, Good air movement bilaterally, CTAB RRR,No Gallops,Rubs or new Murmurs, No  Parasternal Heave +ve B.Sounds, Abd Soft, Non tender, No organomegaly appriciated, No rebound -guarding or rigidity. No Cyanosis, Clubbing or edema, No new Rash or bruise   Data Review   CBC w Diff:  Lab Results  Component Value Date   WBC 3.4* 08/31/2014   HGB 13.0 08/31/2014   HCT 36.8* 08/31/2014   PLT 145* 08/31/2014   LYMPHOPCT 14  08/27/2014   MONOPCT 9 08/27/2014   EOSPCT 0 08/27/2014   BASOPCT 0 08/27/2014    CMP:  Lab Results  Component Value Date   NA 138 08/31/2014   K 4.4 08/31/2014   CL 104 08/31/2014   CO2 27 08/31/2014   BUN 17 08/31/2014   CREATININE 1.02 08/31/2014   PROT 5.5* 08/31/2014   ALBUMIN 2.3* 08/31/2014   BILITOT 1.0 08/31/2014   ALKPHOS 139* 08/31/2014   AST 194* 08/31/2014   ALT 156* 08/31/2014  .   Total Time in preparing paper work, data evaluation and todays exam - 35 minutes  Thurnell Lose M.D on 08/31/2014 at 2:07 PM  [facility]   Office  [phone]

## 2014-08-31 NOTE — Progress Notes (Signed)
08/31/2014 8:27 AM Pt's daughter Santiago Glad is concerned about her father going to a facility the family did not choose. She is also voices concerns that he will become more confused.  The doctor has explained to her several times that this is a normal process of aging and that the pt would benefit from their presence at the facility.  There is not a bed available now at the facility the family did agree on.  She has been told pt is medically stable to be discharged and to be discharged today to the facility that is available. [place]

## 2014-08-31 NOTE — Progress Notes (Signed)
Pt still very confused yet oriented to self, family at bedside, pt became very agitated and restless, pt given Valium 5 mg per MD order but agitation increased, family requested additional medication to allow pt to rest tonight, MD notified and ordered Haldol 2mg  PO, Haldol administered per MD order, bed alarm in use, call bell within reach, family at bedside, RN will continue to monitor pt.   [place]on Mole, [place] 08/31/2014

## 2014-08-31 NOTE — Progress Notes (Signed)
08/31/2014 3:51 PM Pt discharged to [facility] via [facility].

## 2014-08-31 NOTE — Progress Notes (Signed)
Progress Notes  I was asked by Dr. Candiss Norse to provide a second opinion on Mr.Yarbough's medical condition as this was requested by family members.  Patient is an 81 year old gentleman with a history of cognitive impairment who was admitted to the medicine service on 08/27/2014 when he presented with fever associated with acute encephalopathy. Imaging studies showing the presence of pneumonia. During this hospitalization he underwent an extensive workup that included both MRI and CT scan of brain that did not show evidence of acute intracranial abnormalities. Abnormal liver function tests were worked up with abdominal ultrasound and CT scan of abdomen which did not show acute abdominal pathology. Scrotal ultrasound did show evidence of epididymitis. Patient having delusions, agitation, hallucinations consistent with delirium. Underlying infectious process, medications, hospitalization or likely contributors to development of delirium in this patient.   Recommendations  I think he is stable for discharge, has been afebrile for greater than 72 hours, A. fib is rate controlled, blood pressures are stable. He has undergone an extensive workup during this hospitalization. I explained to family members that I think he likely has dementia. His healthcare needs will likely gradually increase and that infections and hospitalizations could cause or precipitate delirium in the future.   Recommend avoiding benzodiazepine therapy as this can worsen his agitation  Provide Seroquel 12.5 mg by mouth twice a day  Would likely benefit from a geriatric assessment in the outpatient setting

## 2014-08-31 NOTE — Clinical Social Work Placement (Signed)
   CLINICAL SOCIAL WORK PLACEMENT  NOTE  Date:  08/31/2014  Patient Details  Name: Vincent Sharp MRN: [id number] Date of Birth: 11-21-1932  Clinical Social Work is seeking post-discharge placement for this patient at the [place] level of care (*CSW will initial, date and re-position this form in  chart as items are completed):  Yes   Patient/family provided with [facility] Work Department's list of facilities offering this level of care within the geographic area requested by the patient (or if unable, by the patient's family).  Yes   Patient/family informed of their freedom to choose among providers that offer the needed level of care, that participate in Medicare, Medicaid or managed care program needed by the patient, have an available bed and are willing to accept the patient.  Yes   Patient/family informed of [facility]'s ownership interest in [facility] and [facility], as well as of the fact that they are under no obligation to receive care at these facilities.  PASRR submitted to EDS on 08/29/14     PASRR number received on 08/29/14     Existing PASRR number confirmed on       FL2 transmitted to all facilities in geographic area requested by pt/family on 08/29/14     FL2 transmitted to all facilities within larger geographic area on       Patient informed that his/her managed care company has contracts with or will negotiate with certain facilities, including the following:          YES  Patient/family informed of bed offers received.  Patient chooses bed at  [facility] recommends and patient chooses bed at      Patient to be transferred to  [facility] on  08/31/14.  Patient to be transferred to facility by  ambulance     Patient family notified on  08/31/14 of transfer.  Name of family member notified:   Daughter Juliann Pulse     PHYSICIAN Please sign FL2     Additional Comment:     _______________________________________________ Sable Feil, LCSW 08/31/2014, 9:56 AM

## 2014-09-01 LAB — CULTURE, BLOOD (ROUTINE X 2)
Culture: NO GROWTH
Culture: NO GROWTH

## 2014-09-09 DIAGNOSIS — N433 Hydrocele, unspecified: Secondary | ICD-10-CM | POA: Diagnosis not present

## 2014-09-09 DIAGNOSIS — N401 Enlarged prostate with lower urinary tract symptoms: Secondary | ICD-10-CM | POA: Diagnosis not present

## 2014-09-09 DIAGNOSIS — K4090 Unilateral inguinal hernia, without obstruction or gangrene, not specified as recurrent: Secondary | ICD-10-CM | POA: Diagnosis not present

## 2014-09-09 DIAGNOSIS — R338 Other retention of urine: Secondary | ICD-10-CM | POA: Diagnosis not present

## 2014-09-10 DIAGNOSIS — R748 Abnormal levels of other serum enzymes: Secondary | ICD-10-CM | POA: Diagnosis not present

## 2014-09-10 DIAGNOSIS — Z6830 Body mass index (BMI) 30.0-30.9, adult: Secondary | ICD-10-CM | POA: Diagnosis not present

## 2014-09-10 DIAGNOSIS — J13 Pneumonia due to Streptococcus pneumoniae: Secondary | ICD-10-CM | POA: Diagnosis not present

## 2014-09-10 DIAGNOSIS — I480 Paroxysmal atrial fibrillation: Secondary | ICD-10-CM | POA: Diagnosis not present

## 2014-09-10 DIAGNOSIS — R410 Disorientation, unspecified: Secondary | ICD-10-CM | POA: Diagnosis not present

## 2014-09-13 DIAGNOSIS — I2510 Atherosclerotic heart disease of native coronary artery without angina pectoris: Secondary | ICD-10-CM | POA: Diagnosis not present

## 2014-09-13 DIAGNOSIS — Z7901 Long term (current) use of anticoagulants: Secondary | ICD-10-CM | POA: Diagnosis not present

## 2014-09-13 DIAGNOSIS — I480 Paroxysmal atrial fibrillation: Secondary | ICD-10-CM | POA: Diagnosis not present

## 2014-09-29 DIAGNOSIS — I480 Paroxysmal atrial fibrillation: Secondary | ICD-10-CM | POA: Diagnosis not present

## 2014-09-29 DIAGNOSIS — Z6830 Body mass index (BMI) 30.0-30.9, adult: Secondary | ICD-10-CM | POA: Diagnosis not present

## 2014-09-29 DIAGNOSIS — J13 Pneumonia due to Streptococcus pneumoniae: Secondary | ICD-10-CM | POA: Diagnosis not present

## 2014-09-29 DIAGNOSIS — R410 Disorientation, unspecified: Secondary | ICD-10-CM | POA: Diagnosis not present

## 2014-09-29 DIAGNOSIS — N451 Epididymitis: Secondary | ICD-10-CM | POA: Diagnosis not present

## 2014-09-29 DIAGNOSIS — R748 Abnormal levels of other serum enzymes: Secondary | ICD-10-CM | POA: Diagnosis not present

## 2014-09-29 DIAGNOSIS — I2510 Atherosclerotic heart disease of native coronary artery without angina pectoris: Secondary | ICD-10-CM | POA: Diagnosis not present

## 2014-10-02 DIAGNOSIS — H5010 Unspecified exotropia: Secondary | ICD-10-CM | POA: Diagnosis not present

## 2014-10-02 DIAGNOSIS — H43813 Vitreous degeneration, bilateral: Secondary | ICD-10-CM | POA: Diagnosis not present

## 2014-10-02 DIAGNOSIS — H40013 Open angle with borderline findings, low risk, bilateral: Secondary | ICD-10-CM | POA: Diagnosis not present

## 2014-10-02 DIAGNOSIS — H04123 Dry eye syndrome of bilateral lacrimal glands: Secondary | ICD-10-CM | POA: Diagnosis not present

## 2014-10-25 DIAGNOSIS — I480 Paroxysmal atrial fibrillation: Secondary | ICD-10-CM | POA: Diagnosis not present

## 2014-10-25 DIAGNOSIS — Z7901 Long term (current) use of anticoagulants: Secondary | ICD-10-CM | POA: Diagnosis not present

## 2014-10-25 DIAGNOSIS — I2510 Atherosclerotic heart disease of native coronary artery without angina pectoris: Secondary | ICD-10-CM | POA: Diagnosis not present

## 2014-11-09 DIAGNOSIS — Z23 Encounter for immunization: Secondary | ICD-10-CM | POA: Diagnosis not present

## 2014-11-21 ENCOUNTER — Encounter: Payer: Self-pay | Admitting: Adult Health

## 2014-11-21 ENCOUNTER — Ambulatory Visit (INDEPENDENT_AMBULATORY_CARE_PROVIDER_SITE_OTHER): Payer: Medicare Other | Admitting: Adult Health

## 2014-11-21 VITALS — BP 113/58 | HR 53 | Ht 72.0 in | Wt 234.0 lb

## 2014-11-21 DIAGNOSIS — G4733 Obstructive sleep apnea (adult) (pediatric): Secondary | ICD-10-CM

## 2014-11-21 DIAGNOSIS — Z9989 Dependence on other enabling machines and devices: Principal | ICD-10-CM

## 2014-11-21 NOTE — Progress Notes (Signed)
I agree with the assessment and plan as directed by NP .The patient is known to me .   DOHMEIER,CARMEN, MD  

## 2014-11-21 NOTE — Progress Notes (Signed)
PATIENT: Vincent Sharp DOB: 05-Apr-1932  REASON FOR VISIT: follow up- OSA on CPAP HISTORY FROM: patient  HISTORY OF PRESENT ILLNESS: Vincent Sharp is an 81 year old male with a history of obstructive sleep apnea on BiPAP. He returns today for a compliance download. The patient states that he has not used his machine in over a year. Patient states that he had rotator cuffs surgery in November 2015 and was hospitalized in July for pneumonia. Patient states that he has a hard time using the machine because the straps irritate his face. The patient's face is very sensitive after having several treatments for skin cancer. The patient is open to trying to use the machine however he would like to find a mask and straps that do not irritate his face. He was recently sent a nasal pillow with the thin straps. He has not tried this yet. His Epworth sleepiness score is 21. When he has used the CPAP more consistently in the past his wife feels that he did receive benefit as she states that he did not have to sleep as much during the day. He returns today for an evaluation.  HISTORY  12/13/13: Vincent Sharp is a 81 year old male with a history of OSA on BiPAP. He returns today for a 30 days compliance download. He brought his machine with him today and his reports shows an AHI of 8.0 on BiPAP. He uses his machine for average of 5 hours and 53 minutes a night. Patient compliance is 60 % -only used the machine for >4 hour for 18 days. He does have a leak at 17.9L/min. Patient reports that he gets about 6-7 hours of sleep a night. He goes to bed around 12 am and arises between 6-7am. He denies having trouble falling asleep or staying a sleep. Patient does have trouble falling and staying asleep with the BiPAP on. He recently fell while moving tree limbs. He fell on the right shoulder and hip. Since the fall he has had limited range of motion of the right arm. His urologist ordered an x-ray of the right shoulder that  was unremarkable. He states that it is also painful when he lays down to sleep. He explains that this is part of the reason he does not use CPAP like he should. He has not followed up with his PCP regarding this shoulder injury. He states that he has a bruise on the left hip and it is sore but has not affected his walking.   HISTORY: 81 year old male with history of obstructive sleep Apnea . He returns today for a 30 days compliance download. He brought his machine with him today and his reports shows an AHI of 8.1 on BiPAP. He uses his machine for average of 2 hours a night. Patient did not use his machine 16 out of 30 days. His Epworth score is 14 points was previously 15 points. His fatigue severity score is 16 was previously 24. Patient reports that he gets about 6-7 hours of sleep a night. He goes to bed around 12 am and arises between 6-7am. He denies having trouble falling asleep or staying a sleep. Patient does have trouble falling and staying asleep with the BiPAP on. States that the gets up about 1-2 times a night to urinate. Patient has had basal cell skin cancer spots removed from his face. He states that is one reason that he did not wear the BiPAP because the mask and straps put pressure on  the spots he had removed. Patient was also have shoulder pain and having steroid injections and states at night he tossed and turned trying to get comfortable so the mask would come off.   REVIEW OF SYSTEMS: Out of a complete 14 system review of symptoms, the patient complains only of the following symptoms, and all other reviewed systems are negative.  Apnea  ALLERGIES: No Known Allergies  HOME MEDICATIONS: Outpatient Prescriptions Prior to Visit  Medication Sig Dispense Refill  . apixaban (ELIQUIS) 5 MG TABS tablet Take 1 tablet (5 mg total) by mouth 2 (two) times daily. 60 tablet   . atorvastatin (LIPITOR) 20 MG tablet Take 20-40 mg by mouth daily. Take 40 mg every mon, wed, and Friday and 20 mg  all other days    . Multiple Vitamin (MULTI-VITAMIN PO) Take 1 tablet by mouth daily.     . nebivolol (BYSTOLIC) 10 MG tablet Take 10 mg by mouth daily.    . Omega-3 Fatty Acids (FISH OIL) 300 MG CAPS Take 1 capsule by mouth 2 (two) times daily.     Marland Kitchen amiodarone (PACERONE) 200 MG tablet Take 1 tablet (200 mg total) by mouth 2 (two) times daily. (Patient not taking: Reported on 11/21/2014)    . diazepam (VALIUM) 5 MG tablet Take 1 tablet (5 mg total) by mouth every 12 (twelve) hours as needed for anxiety. (Patient not taking: Reported on 11/21/2014) 10 tablet 0  . doxycycline (VIBRA-TABS) 100 MG tablet Take 1 tablet (100 mg total) by mouth every 12 (twelve) hours. 1 more week (Patient not taking: Reported on 11/21/2014) 14 tablet   . oxyCODONE-acetaminophen (PERCOCET) 5-325 MG per tablet Take 1 tablet by mouth every 4 (four) hours as needed. (Patient not taking: Reported on 11/21/2014) 10 tablet 0  . QUEtiapine (SEROQUEL) 25 MG tablet Take 1 tablet (25 mg total) by mouth 2 (two) times daily. (Patient not taking: Reported on 11/21/2014)    . ramipril (ALTACE) 2.5 MG capsule Take 1 capsule (2.5 mg total) by mouth daily. (Patient not taking: Reported on 11/21/2014)     No facility-administered medications prior to visit.    PAST MEDICAL HISTORY: Past Medical History  Diagnosis Date  . Hypertension   . Hyperlipidemia   . Depression   . Colon cancer     skin, colon  . OSA on CPAP   . Obesity hypoventilation syndrome   . Complex sleep apnea syndrome 05/18/2013  . Coronary artery disease   . Pneumonia     as a child    PAST SURGICAL HISTORY: Past Surgical History  Procedure Laterality Date  . Polypectomy  1989  . Hydrocele excision / repair    . Cataract extraction Bilateral   . Coronary artery bypass graft  1992    x3, Dr Redmond Pulling surgeon,  Dr Wynonia Lawman cardiologist  . Tonsillectomy    . Eye surgery    . Hernia repair      right inguinal  . Knee arthroscopy w/ meniscal repair Right   .  Shoulder arthroscopy with rotator cuff repair and subacromial decompression Right 12/27/2013    Procedure: RIGHT SHOULDER ARTHROSCOPY SUBACROMIAL DECOMPRESSION DISTAL CLAVICLE RESECTION AND ROTATOR CUFF REPAIR, BISCEPS TENOTOMY;  Surgeon: Marin Shutter, MD;  Location: [facility];  Service: Orthopedics;  Laterality: Right;  . Partial colectomy      FAMILY HISTORY: Family History  Problem Relation Age of Onset  . Heart disease Mother     SOCIAL HISTORY: Social History   Social History  .  Marital Status: Married    Spouse Name: [name]  . Number of Children: 3  . Years of Education: HS   Occupational History  . RET [employer]    N/A  .  [place] History Main Topics  . Smoking status: Never Smoker   . Smokeless tobacco: Never Used  . Alcohol Use: No  . Drug Use: No  . Sexual Activity: No   Other Topics Concern  . Not on file   Social History Narrative   Patient lives at home with his spouseWilburn Cornelia)   Patient has three children.   Married 57 yrs   Caffeine Use: 1/2 cup daily.   Patient is retired.   Patient is right-handed.      PHYSICAL EXAM  Filed Vitals:   11/21/14 0923  BP: 113/58  Pulse: 53  Height: 6' (1.829 m)  Weight: 234 lb (106.142 kg)   Body mass index is 31.73 kg/(m^2).  Generalized: Well developed, in no acute distress  Neck: Circumference 16-1/2 inches, Mallampati 4+  Neurological examination  Mentation: Alert oriented to time, place, history taking. Follows all commands speech and language fluent Cranial nerve II-XII: Extraocular movements were full, visual field were full on confrontational test. Facial sensation and strength were normal. Uvula tongue midline. Head turning and shoulder shrug  were normal and symmetric. Motor: The motor testing reveals 5 over 5 strength of all 4 extremities. Good symmetric motor tone is noted throughout.  Gait and station: Patient requires assistance when going from a sitting to standing  position. Gait is slighty unsteady but cautious. Reflexes: Deep tendon reflexes are symmetric and normal bilaterally.   DIAGNOSTIC DATA (LABS, IMAGING, TESTING) - I reviewed patient records, labs, notes, testing and imaging myself where available.  Lab Results  Component Value Date   WBC 3.4* 08/31/2014   HGB 13.0 08/31/2014   HCT 36.8* 08/31/2014   MCV 87.8 08/31/2014   PLT 145* 08/31/2014      Component Value Date/Time   NA 138 08/31/2014 0311   K 4.4 08/31/2014 0311   CL 104 08/31/2014 0311   CO2 27 08/31/2014 0311   GLUCOSE 108* 08/31/2014 0311   BUN 17 08/31/2014 0311   CREATININE 1.02 08/31/2014 0311   CALCIUM 8.5* 08/31/2014 0311   PROT 5.5* 08/31/2014 0311   ALBUMIN 2.3* 08/31/2014 0311   AST 194* 08/31/2014 0311   ALT 156* 08/31/2014 0311   ALKPHOS 139* 08/31/2014 0311   BILITOT 1.0 08/31/2014 0311   GFRNONAA >60 08/31/2014 0311   GFRAA >60 08/31/2014 0311    ASSESSMENT AND PLAN 81 y.o. year old male  has a past medical history of Hypertension; Hyperlipidemia; Depression; Colon cancer; OSA on CPAP; Obesity hypoventilation syndrome; Complex sleep apnea syndrome (05/18/2013); Coronary artery disease; and Pneumonia. here with:  1. Obstructive sleep apnea on CPAP  Overall the patient is willing to try using the CPAP machine. He just recently received a new mask and he will give this a try. If he finds that he is unable to use it that he will let us know. At that time we will have him come in for a mask fitting with Robin in our sleep lab. He will return in 3-4 months for another download with Dr. Mechele Claude, MSN, NP-C 11/21/2014, 9:30 AM [facility] [address] [place], [place] [address] [phone]

## 2014-11-21 NOTE — Patient Instructions (Signed)
Try using the CPAP nightly.

## 2015-01-01 DIAGNOSIS — L905 Scar conditions and fibrosis of skin: Secondary | ICD-10-CM | POA: Diagnosis not present

## 2015-01-01 DIAGNOSIS — L820 Inflamed seborrheic keratosis: Secondary | ICD-10-CM | POA: Diagnosis not present

## 2015-01-01 DIAGNOSIS — L570 Actinic keratosis: Secondary | ICD-10-CM | POA: Diagnosis not present

## 2015-01-01 DIAGNOSIS — C44319 Basal cell carcinoma of skin of other parts of face: Secondary | ICD-10-CM | POA: Diagnosis not present

## 2015-01-01 DIAGNOSIS — L821 Other seborrheic keratosis: Secondary | ICD-10-CM | POA: Diagnosis not present

## 2015-02-03 DIAGNOSIS — I2510 Atherosclerotic heart disease of native coronary artery without angina pectoris: Secondary | ICD-10-CM | POA: Diagnosis not present

## 2015-02-03 DIAGNOSIS — G4733 Obstructive sleep apnea (adult) (pediatric): Secondary | ICD-10-CM | POA: Diagnosis not present

## 2015-02-03 DIAGNOSIS — R748 Abnormal levels of other serum enzymes: Secondary | ICD-10-CM | POA: Diagnosis not present

## 2015-02-03 DIAGNOSIS — Z6832 Body mass index (BMI) 32.0-32.9, adult: Secondary | ICD-10-CM | POA: Diagnosis not present

## 2015-02-03 DIAGNOSIS — E784 Other hyperlipidemia: Secondary | ICD-10-CM | POA: Diagnosis not present

## 2015-02-03 DIAGNOSIS — M1990 Unspecified osteoarthritis, unspecified site: Secondary | ICD-10-CM | POA: Diagnosis not present

## 2015-02-03 DIAGNOSIS — I10 Essential (primary) hypertension: Secondary | ICD-10-CM | POA: Diagnosis not present

## 2015-02-03 DIAGNOSIS — I480 Paroxysmal atrial fibrillation: Secondary | ICD-10-CM | POA: Diagnosis not present

## 2015-02-03 DIAGNOSIS — Z1389 Encounter for screening for other disorder: Secondary | ICD-10-CM | POA: Diagnosis not present

## 2015-02-25 ENCOUNTER — Ambulatory Visit: Payer: Medicare Other | Admitting: Neurology

## 2015-03-07 DIAGNOSIS — M1712 Unilateral primary osteoarthritis, left knee: Secondary | ICD-10-CM | POA: Diagnosis not present

## 2015-03-07 DIAGNOSIS — M545 Low back pain: Secondary | ICD-10-CM | POA: Diagnosis not present

## 2015-03-20 ENCOUNTER — Telehealth: Payer: Self-pay | Admitting: Neurology

## 2015-03-20 NOTE — Telephone Encounter (Signed)
Patient's wife is calling. She states the patient has not used his CPAP machine in over 4 months which is the last time she saw [place]. His wife states she needs to bring the CPAP machine in to check the settings to regulate the air flowing and pressure. Please call to discuss.

## 2015-03-24 NOTE — Telephone Encounter (Signed)
Patient and wife came by office today to have his CPAP machine checked. Machine is set accordingly at [facility]. Pt has brand new Resmed airfit nasal pillow mask and brand new tubing. Pt had appt in early January but wife cancelled it.  I encouraged wife or patient to call and reschedule patient's appointment for to be in 30 days. That will give Dr. Brett Fairy plenty of info to see if patient is tolerating.  Pt was strongly encouraged to wear CPAP everyday til appointment.

## 2015-04-29 DIAGNOSIS — I10 Essential (primary) hypertension: Secondary | ICD-10-CM | POA: Diagnosis not present

## 2015-04-29 DIAGNOSIS — E784 Other hyperlipidemia: Secondary | ICD-10-CM | POA: Diagnosis not present

## 2015-04-29 DIAGNOSIS — I480 Paroxysmal atrial fibrillation: Secondary | ICD-10-CM | POA: Diagnosis not present

## 2015-04-29 DIAGNOSIS — Z7901 Long term (current) use of anticoagulants: Secondary | ICD-10-CM | POA: Diagnosis not present

## 2015-04-29 DIAGNOSIS — I2510 Atherosclerotic heart disease of native coronary artery without angina pectoris: Secondary | ICD-10-CM | POA: Diagnosis not present

## 2015-05-12 DIAGNOSIS — C44319 Basal cell carcinoma of skin of other parts of face: Secondary | ICD-10-CM | POA: Diagnosis not present

## 2015-05-13 DIAGNOSIS — H40013 Open angle with borderline findings, low risk, bilateral: Secondary | ICD-10-CM | POA: Diagnosis not present

## 2015-06-18 DIAGNOSIS — M1712 Unilateral primary osteoarthritis, left knee: Secondary | ICD-10-CM | POA: Diagnosis not present

## 2015-06-23 DIAGNOSIS — Z85828 Personal history of other malignant neoplasm of skin: Secondary | ICD-10-CM | POA: Diagnosis not present

## 2015-06-25 DIAGNOSIS — M1712 Unilateral primary osteoarthritis, left knee: Secondary | ICD-10-CM | POA: Diagnosis not present

## 2015-07-02 DIAGNOSIS — M1712 Unilateral primary osteoarthritis, left knee: Secondary | ICD-10-CM | POA: Diagnosis not present

## 2015-08-15 DIAGNOSIS — M1712 Unilateral primary osteoarthritis, left knee: Secondary | ICD-10-CM | POA: Diagnosis not present

## 2015-09-11 DIAGNOSIS — Z125 Encounter for screening for malignant neoplasm of prostate: Secondary | ICD-10-CM | POA: Diagnosis not present

## 2015-09-11 DIAGNOSIS — I10 Essential (primary) hypertension: Secondary | ICD-10-CM | POA: Diagnosis not present

## 2015-09-11 DIAGNOSIS — E784 Other hyperlipidemia: Secondary | ICD-10-CM | POA: Diagnosis not present

## 2015-09-12 DIAGNOSIS — N430 Encysted hydrocele: Secondary | ICD-10-CM | POA: Diagnosis not present

## 2015-09-12 DIAGNOSIS — R351 Nocturia: Secondary | ICD-10-CM | POA: Diagnosis not present

## 2015-09-12 DIAGNOSIS — N401 Enlarged prostate with lower urinary tract symptoms: Secondary | ICD-10-CM | POA: Diagnosis not present

## 2015-09-16 DIAGNOSIS — I480 Paroxysmal atrial fibrillation: Secondary | ICD-10-CM | POA: Diagnosis not present

## 2015-09-16 DIAGNOSIS — Z1389 Encounter for screening for other disorder: Secondary | ICD-10-CM | POA: Diagnosis not present

## 2015-09-16 DIAGNOSIS — Z23 Encounter for immunization: Secondary | ICD-10-CM | POA: Diagnosis not present

## 2015-09-16 DIAGNOSIS — Z6832 Body mass index (BMI) 32.0-32.9, adult: Secondary | ICD-10-CM | POA: Diagnosis not present

## 2015-09-16 DIAGNOSIS — I10 Essential (primary) hypertension: Secondary | ICD-10-CM | POA: Diagnosis not present

## 2015-09-16 DIAGNOSIS — M1990 Unspecified osteoarthritis, unspecified site: Secondary | ICD-10-CM | POA: Diagnosis not present

## 2015-09-16 DIAGNOSIS — M5410 Radiculopathy, site unspecified: Secondary | ICD-10-CM | POA: Diagnosis not present

## 2015-09-16 DIAGNOSIS — R2689 Other abnormalities of gait and mobility: Secondary | ICD-10-CM | POA: Diagnosis not present

## 2015-09-16 DIAGNOSIS — E784 Other hyperlipidemia: Secondary | ICD-10-CM | POA: Diagnosis not present

## 2015-09-16 DIAGNOSIS — I2510 Atherosclerotic heart disease of native coronary artery without angina pectoris: Secondary | ICD-10-CM | POA: Diagnosis not present

## 2015-09-16 DIAGNOSIS — M542 Cervicalgia: Secondary | ICD-10-CM | POA: Diagnosis not present

## 2015-09-16 DIAGNOSIS — Z0000 Encounter for general adult medical examination without abnormal findings: Secondary | ICD-10-CM | POA: Diagnosis not present

## 2015-09-22 DIAGNOSIS — Z85828 Personal history of other malignant neoplasm of skin: Secondary | ICD-10-CM | POA: Diagnosis not present

## 2015-09-22 DIAGNOSIS — L570 Actinic keratosis: Secondary | ICD-10-CM | POA: Diagnosis not present

## 2015-10-07 DIAGNOSIS — H40013 Open angle with borderline findings, low risk, bilateral: Secondary | ICD-10-CM | POA: Diagnosis not present

## 2015-10-07 DIAGNOSIS — H43813 Vitreous degeneration, bilateral: Secondary | ICD-10-CM | POA: Diagnosis not present

## 2015-10-07 DIAGNOSIS — Z961 Presence of intraocular lens: Secondary | ICD-10-CM | POA: Diagnosis not present

## 2015-10-07 DIAGNOSIS — H01003 Unspecified blepharitis right eye, unspecified eyelid: Secondary | ICD-10-CM | POA: Diagnosis not present

## 2015-10-15 DIAGNOSIS — M549 Dorsalgia, unspecified: Secondary | ICD-10-CM | POA: Diagnosis not present

## 2015-11-10 DIAGNOSIS — I872 Venous insufficiency (chronic) (peripheral): Secondary | ICD-10-CM | POA: Diagnosis not present

## 2015-11-10 DIAGNOSIS — I2510 Atherosclerotic heart disease of native coronary artery without angina pectoris: Secondary | ICD-10-CM | POA: Diagnosis not present

## 2015-11-10 DIAGNOSIS — I480 Paroxysmal atrial fibrillation: Secondary | ICD-10-CM | POA: Diagnosis not present

## 2015-11-10 DIAGNOSIS — Z7901 Long term (current) use of anticoagulants: Secondary | ICD-10-CM | POA: Diagnosis not present

## 2015-12-01 DIAGNOSIS — R600 Localized edema: Secondary | ICD-10-CM | POA: Diagnosis not present

## 2015-12-31 DIAGNOSIS — L570 Actinic keratosis: Secondary | ICD-10-CM | POA: Diagnosis not present

## 2015-12-31 DIAGNOSIS — L814 Other melanin hyperpigmentation: Secondary | ICD-10-CM | POA: Diagnosis not present

## 2015-12-31 DIAGNOSIS — D225 Melanocytic nevi of trunk: Secondary | ICD-10-CM | POA: Diagnosis not present

## 2015-12-31 DIAGNOSIS — Z85828 Personal history of other malignant neoplasm of skin: Secondary | ICD-10-CM | POA: Diagnosis not present

## 2015-12-31 DIAGNOSIS — L821 Other seborrheic keratosis: Secondary | ICD-10-CM | POA: Diagnosis not present

## 2015-12-31 DIAGNOSIS — D1801 Hemangioma of skin and subcutaneous tissue: Secondary | ICD-10-CM | POA: Diagnosis not present

## 2016-03-25 DIAGNOSIS — I509 Heart failure, unspecified: Secondary | ICD-10-CM

## 2016-03-25 HISTORY — DX: Heart failure, unspecified: I50.9

## 2016-04-11 ENCOUNTER — Encounter (HOSPITAL_COMMUNITY): Payer: Self-pay | Admitting: Emergency Medicine

## 2016-04-11 ENCOUNTER — Emergency Department (HOSPITAL_COMMUNITY): Payer: Medicare Other

## 2016-04-11 ENCOUNTER — Inpatient Hospital Stay (HOSPITAL_COMMUNITY)
Admission: EM | Admit: 2016-04-11 | Discharge: 2016-04-15 | DRG: 292 | Disposition: A | Discharge status: Home / Self Care | Payer: Medicare Other | Attending: Cardiology | Admitting: Cardiology

## 2016-04-11 DIAGNOSIS — I440 Atrioventricular block, first degree: Secondary | ICD-10-CM | POA: Diagnosis present

## 2016-04-11 DIAGNOSIS — R609 Edema, unspecified: Secondary | ICD-10-CM | POA: Diagnosis not present

## 2016-04-11 DIAGNOSIS — Z6835 Body mass index (BMI) 35.0-35.9, adult: Secondary | ICD-10-CM

## 2016-04-11 DIAGNOSIS — R0601 Orthopnea: Secondary | ICD-10-CM

## 2016-04-11 DIAGNOSIS — R188 Other ascites: Secondary | ICD-10-CM | POA: Diagnosis not present

## 2016-04-11 DIAGNOSIS — Z9842 Cataract extraction status, left eye: Secondary | ICD-10-CM | POA: Diagnosis not present

## 2016-04-11 DIAGNOSIS — E785 Hyperlipidemia, unspecified: Secondary | ICD-10-CM | POA: Diagnosis present

## 2016-04-11 DIAGNOSIS — Z9841 Cataract extraction status, right eye: Secondary | ICD-10-CM | POA: Diagnosis not present

## 2016-04-11 DIAGNOSIS — I481 Persistent atrial fibrillation: Secondary | ICD-10-CM | POA: Diagnosis present

## 2016-04-11 DIAGNOSIS — Z9119 Patient's noncompliance with other medical treatment and regimen: Secondary | ICD-10-CM

## 2016-04-11 DIAGNOSIS — Z8249 Family history of ischemic heart disease and other diseases of the circulatory system: Secondary | ICD-10-CM | POA: Diagnosis not present

## 2016-04-11 DIAGNOSIS — E662 Morbid (severe) obesity with alveolar hypoventilation: Secondary | ICD-10-CM | POA: Diagnosis present

## 2016-04-11 DIAGNOSIS — N401 Enlarged prostate with lower urinary tract symptoms: Secondary | ICD-10-CM | POA: Diagnosis present

## 2016-04-11 DIAGNOSIS — I480 Paroxysmal atrial fibrillation: Secondary | ICD-10-CM | POA: Diagnosis present

## 2016-04-11 DIAGNOSIS — I2510 Atherosclerotic heart disease of native coronary artery without angina pectoris: Secondary | ICD-10-CM | POA: Diagnosis present

## 2016-04-11 DIAGNOSIS — Z9049 Acquired absence of other specified parts of digestive tract: Secondary | ICD-10-CM | POA: Diagnosis not present

## 2016-04-11 DIAGNOSIS — I5033 Acute on chronic diastolic (congestive) heart failure: Secondary | ICD-10-CM | POA: Diagnosis present

## 2016-04-11 DIAGNOSIS — Z951 Presence of aortocoronary bypass graft: Secondary | ICD-10-CM | POA: Diagnosis not present

## 2016-04-11 DIAGNOSIS — K8020 Calculus of gallbladder without cholecystitis without obstruction: Secondary | ICD-10-CM | POA: Diagnosis present

## 2016-04-11 DIAGNOSIS — Z79899 Other long term (current) drug therapy: Secondary | ICD-10-CM | POA: Diagnosis not present

## 2016-04-11 DIAGNOSIS — D696 Thrombocytopenia, unspecified: Secondary | ICD-10-CM | POA: Diagnosis present

## 2016-04-11 DIAGNOSIS — F05 Delirium due to known physiological condition: Secondary | ICD-10-CM | POA: Diagnosis present

## 2016-04-11 DIAGNOSIS — I110 Hypertensive heart disease with heart failure: Principal | ICD-10-CM | POA: Diagnosis present

## 2016-04-11 DIAGNOSIS — Z7901 Long term (current) use of anticoagulants: Secondary | ICD-10-CM

## 2016-04-11 DIAGNOSIS — I272 Pulmonary hypertension, unspecified: Secondary | ICD-10-CM | POA: Diagnosis present

## 2016-04-11 DIAGNOSIS — I509 Heart failure, unspecified: Secondary | ICD-10-CM | POA: Insufficient documentation

## 2016-04-11 DIAGNOSIS — R0602 Shortness of breath: Secondary | ICD-10-CM | POA: Diagnosis present

## 2016-04-11 DIAGNOSIS — I4892 Unspecified atrial flutter: Secondary | ICD-10-CM | POA: Diagnosis present

## 2016-04-11 DIAGNOSIS — I5032 Chronic diastolic (congestive) heart failure: Secondary | ICD-10-CM

## 2016-04-11 HISTORY — DX: Personal history of other malignant neoplasm of large intestine: Z85.038

## 2016-04-11 HISTORY — DX: Atherosclerotic heart disease of native coronary artery without angina pectoris: I25.10

## 2016-04-11 HISTORY — DX: Essential (primary) hypertension: I10

## 2016-04-11 HISTORY — DX: Obesity, unspecified: E66.9

## 2016-04-11 HISTORY — DX: Spinal stenosis, cervical region: M48.02

## 2016-04-11 LAB — URINALYSIS, ROUTINE W REFLEX MICROSCOPIC
Bilirubin Urine: NEGATIVE
GLUCOSE, UA: NEGATIVE mg/dL
HGB URINE DIPSTICK: NEGATIVE
Ketones, ur: NEGATIVE mg/dL
LEUKOCYTES UA: NEGATIVE
Nitrite: NEGATIVE
PH: 5 (ref 5.0–8.0)
Protein, ur: NEGATIVE mg/dL
Specific Gravity, Urine: 1.013 (ref 1.005–1.030)

## 2016-04-11 LAB — COMPREHENSIVE METABOLIC PANEL
ALBUMIN: 3.7 g/dL (ref 3.5–5.0)
ALT: 66 U/L — AB (ref 17–63)
AST: 49 U/L — AB (ref 15–41)
Alkaline Phosphatase: 81 U/L (ref 38–126)
Anion gap: 7 (ref 5–15)
BILIRUBIN TOTAL: 1.5 mg/dL — AB (ref 0.3–1.2)
BUN: 17 mg/dL (ref 6–20)
CO2: 27 mmol/L (ref 22–32)
CREATININE: 1.13 mg/dL (ref 0.61–1.24)
Calcium: 9.2 mg/dL (ref 8.9–10.3)
Chloride: 106 mmol/L (ref 101–111)
GFR calc Af Amer: >60 mL/min (ref >60)
GFR, EST NON AFRICAN AMERICAN: 59 mL/min — AB (ref >60)
GLUCOSE: 105 mg/dL — AB (ref 65–99)
POTASSIUM: 4.3 mmol/L (ref 3.5–5.1)
Sodium: 140 mmol/L (ref 135–145)
Total Protein: 6 g/dL — ABNORMAL LOW (ref 6.5–8.1)

## 2016-04-11 LAB — I-STAT TROPONIN, ED: TROPONIN I, POC: 0.01 ng/mL (ref 0.00–0.08)

## 2016-04-11 LAB — CBC
HEMATOCRIT: 42.5 % (ref 39.0–52.0)
HEMOGLOBIN: 14.3 g/dL (ref 13.0–17.0)
MCH: 30.2 pg (ref 26.0–34.0)
MCHC: 33.6 g/dL (ref 30.0–36.0)
MCV: 89.9 fL (ref 78.0–100.0)
Platelets: 78 10*3/uL — ABNORMAL LOW (ref 150–400)
RBC: 4.73 MIL/uL (ref 4.22–5.81)
RDW: 13.1 % (ref 11.5–15.5)
WBC: 3.4 10*3/uL — ABNORMAL LOW (ref 4.0–10.5)

## 2016-04-11 LAB — BRAIN NATRIURETIC PEPTIDE: B NATRIURETIC PEPTIDE 5: 386.4 pg/mL — AB (ref 0.0–100.0)

## 2016-04-11 LAB — TSH: TSH: 0.944 u[IU]/mL (ref 0.350–4.500)

## 2016-04-11 MED ORDER — FUROSEMIDE 10 MG/ML IJ SOLN
40.0000 mg | Freq: Once | INTRAMUSCULAR | Status: AC
  Administered 2016-04-11: 40 mg via INTRAVENOUS
  Filled 2016-04-11: qty 4

## 2016-04-11 MED ORDER — ACETAMINOPHEN 325 MG PO TABS
650.0000 mg | ORAL_TABLET | ORAL | Status: DC | PRN
  Administered 2016-04-11: 650 mg via ORAL
  Filled 2016-04-11: qty 2

## 2016-04-11 MED ORDER — ALPRAZOLAM 0.5 MG PO TABS
0.5000 mg | ORAL_TABLET | Freq: Every day | ORAL | Status: DC | PRN
  Administered 2016-04-15: 0.5 mg via ORAL
  Filled 2016-04-11 (×2): qty 1

## 2016-04-11 MED ORDER — FUROSEMIDE 10 MG/ML IJ SOLN
40.0000 mg | Freq: Two times a day (BID) | INTRAMUSCULAR | Status: DC
  Administered 2016-04-11 – 2016-04-13 (×4): 40 mg via INTRAVENOUS
  Filled 2016-04-11 (×4): qty 4

## 2016-04-11 MED ORDER — SODIUM CHLORIDE 0.9% FLUSH
3.0000 mL | Freq: Two times a day (BID) | INTRAVENOUS | Status: DC
  Administered 2016-04-12 – 2016-04-15 (×7): 3 mL via INTRAVENOUS

## 2016-04-11 MED ORDER — SODIUM CHLORIDE 0.9 % IV SOLN
250.0000 mL | INTRAVENOUS | Status: DC | PRN
  Administered 2016-04-13: 250 mL via INTRAVENOUS
  Administered 2016-04-13: 12:00:00 via INTRAVENOUS

## 2016-04-11 MED ORDER — AMIODARONE HCL 200 MG PO TABS
200.0000 mg | ORAL_TABLET | Freq: Two times a day (BID) | ORAL | Status: DC
  Administered 2016-04-11 – 2016-04-15 (×8): 200 mg via ORAL
  Filled 2016-04-11 (×8): qty 1

## 2016-04-11 MED ORDER — ALFUZOSIN HCL ER 10 MG PO TB24
10.0000 mg | ORAL_TABLET | Freq: Every day | ORAL | Status: DC
  Administered 2016-04-11 – 2016-04-15 (×5): 10 mg via ORAL
  Filled 2016-04-11 (×7): qty 1

## 2016-04-11 MED ORDER — ATORVASTATIN CALCIUM 40 MG PO TABS
40.0000 mg | ORAL_TABLET | ORAL | Status: DC
  Administered 2016-04-12: 40 mg via ORAL
  Filled 2016-04-11: qty 2

## 2016-04-11 MED ORDER — NEBIVOLOL HCL 10 MG PO TABS
10.0000 mg | ORAL_TABLET | Freq: Every day | ORAL | Status: DC
  Administered 2016-04-11 – 2016-04-15 (×5): 10 mg via ORAL
  Filled 2016-04-11 (×5): qty 1

## 2016-04-11 MED ORDER — APIXABAN 5 MG PO TABS
5.0000 mg | ORAL_TABLET | Freq: Two times a day (BID) | ORAL | Status: DC
  Administered 2016-04-11 – 2016-04-15 (×8): 5 mg via ORAL
  Filled 2016-04-11 (×8): qty 1

## 2016-04-11 MED ORDER — ATORVASTATIN CALCIUM 20 MG PO TABS
20.0000 mg | ORAL_TABLET | ORAL | Status: DC
  Administered 2016-04-11 – 2016-04-14 (×3): 20 mg via ORAL
  Filled 2016-04-11 (×3): qty 1

## 2016-04-11 MED ORDER — ONDANSETRON HCL 4 MG/2ML IJ SOLN: 4.0000 mg | Freq: Four times a day (QID) | INTRAMUSCULAR | Status: DC | PRN

## 2016-04-11 MED ORDER — SODIUM CHLORIDE 0.9% FLUSH: 3.0000 mL | INTRAVENOUS | Status: DC | PRN

## 2016-04-11 NOTE — ED Triage Notes (Signed)
Pt/wife stated, He has been SOB and legs swelling for the last 3 days.

## 2016-04-11 NOTE — ED Triage Notes (Signed)
Wife stated, I gave him a xanax last night.

## 2016-04-11 NOTE — ED Provider Notes (Signed)
[facility] DEPT Provider Note   CSN: [id number] Arrival date & time: 04/11/16  [id number]     History   Chief Complaint Chief Complaint  Patient presents with  . Shortness of Breath  . Leg Swelling    HPI RENDELL RABB is a 83 y.o. male.  HPI 83 yo M with PMHx of HTN, HLD OSA who presents with LE edema and SOB. Pt States that for the past 3 weeks, he has had progressively worsening cough, shortness of breath, orthopnea, and lower extremity edema. Over the last 2 days, he developed severe shortness of breath, just at rest, and is unable to walk any kind of distance without feeling severely fatigued. He is also noticed severe, worsening, lower extremity edema over the last 24 hours. He endorses an associated aching, throbbing, lower extremity pain. The edema does not significant improved at night. No recent fevers or chills. No history of DVTs. No other medical complaints.  Past Medical History:  Diagnosis Date  . CAD (coronary artery disease), native coronary artery 12/26/2013   CABG with LIMA to LAD SVG to OM, SVG to dx1 1992 Dr. Redmond Pulling Cath 2009 scattered irregularities Left main, occluded LAD, occluded CFX, 40% stenosis proximal RCA, widely patent OM 1 SVG, widely patent LAD LIMA graft and fills first diagonal branch but with stenosis between mammary insertion site and diagonal branch, occluded Diag 1 SVG   . Colon cancer ([place])    skin, colon  . Complex sleep apnea syndrome 05/18/2013  . Coronary artery disease   . Depression   . Essential hypertension 12/26/2013  . History of colon cancer    Treated with partial colectomy   . Hyperlipidemia   . Hypertension   . Obesity (BMI 30-39.9)   . Obesity hypoventilation syndrome ([place])   . OSA on CPAP   . Pneumonia    as a child  . Spinal stenosis in cervical region 12/11/2012    Patient Active Problem List   Diagnosis Date Noted  . Acute on chronic diastolic congestive heart failure ([place]) 04/11/2016  . Acute congestive heart  failure ([place]) 04/11/2016  . Paroxysmal atrial fibrillation (H[facility])   . Current use of long term anticoagulation   . History of colon cancer   . Obesity (BMI 30-39.9)   . CAD (coronary artery disease), native coronary artery 12/26/2013  . Essential hypertension 12/26/2013  . Hyperlipidemia 12/26/2013  . S/P CABG (coronary artery bypass graft) 12/26/2013  . OSA on CPAP   . Spinal stenosis in cervical region 12/11/2012    Past Surgical History:  Procedure Laterality Date  . CATARACT EXTRACTION Bilateral   . CORONARY ARTERY BYPASS GRAFT  1992   x3, Dr Redmond Pulling surgeon,  Dr Wynonia Lawman cardiologist  . HERNIA REPAIR     right inguinal  . HYDROCELE EXCISION / REPAIR    . KNEE ARTHROSCOPY W/ MENISCAL REPAIR Right   . PARTIAL COLECTOMY    . POLYPECTOMY  1989  . SHOULDER ARTHROSCOPY WITH ROTATOR CUFF REPAIR AND SUBACROMIAL DECOMPRESSION Right 12/27/2013   Procedure: RIGHT SHOULDER ARTHROSCOPY SUBACROMIAL DECOMPRESSION DISTAL CLAVICLE RESECTION AND ROTATOR CUFF REPAIR, BISCEPS TENOTOMY;  Surgeon: Marin Shutter, MD;  Location: [facility];  Service: Orthopedics;  Laterality: Right;  . TONSILLECTOMY         Home Medications    Prior to Admission medications   Medication Sig Start Date End Date Taking? Authorizing Provider  alfuzosin (UROXATRAL) 10 MG 24 hr tablet Take 10 mg by mouth daily. 01/22/16  Yes Historical Provider,  MD  ALPRAZolam (XANAX) 0.5 MG tablet Take 0.5 mg by mouth daily as needed for anxiety.   Yes Historical Provider, MD  apixaban (ELIQUIS) 5 MG TABS tablet Take 1 tablet (5 mg total) by mouth 2 (two) times daily. 08/30/14  Yes Thurnell Lose, MD  atorvastatin (LIPITOR) 20 MG tablet Take 20-40 mg by mouth daily. Take 40 mg every mon, wed, and Friday and 20 mg all other days   Yes Historical Provider, MD  Multiple Vitamin (MULTI-VITAMIN PO) Take 1 tablet by mouth daily.    Yes Historical Provider, MD  nebivolol (BYSTOLIC) 10 MG tablet Take 10 mg by mouth daily.   Yes Historical Provider,  MD  Omega-3 Fatty Acids (FISH OIL) 300 MG CAPS Take 1 capsule by mouth daily.    Yes Historical Provider, MD    Family History Family History  Problem Relation Age of Onset  . Heart disease Mother     Social History Social History  Substance Use Topics  . Smoking status: Never Smoker  . Smokeless tobacco: Never Used  . Alcohol use No     Allergies   Patient has no known allergies.   Review of Systems Review of Systems  Constitutional: Positive for fatigue. Negative for chills and fever.  HENT: Negative for congestion and rhinorrhea.   Eyes: Negative for visual disturbance.  Respiratory: Positive for cough and shortness of breath. Negative for wheezing.   Cardiovascular: Positive for leg swelling. Negative for chest pain.  Gastrointestinal: Negative for abdominal pain, diarrhea, nausea and vomiting.  Genitourinary: Negative for dysuria and flank pain.  Musculoskeletal: Negative for neck pain and neck stiffness.  Skin: Negative for rash and wound.  Allergic/Immunologic: Negative for immunocompromised state.  Neurological: Positive for weakness. Negative for syncope and headaches.  All other systems reviewed and are negative.    Physical Exam Updated Vital Signs BP 107/83 (BP Location: Left Arm)   Pulse (!) 57   Temp 97.1 F (36.2 C) (Oral)   Resp 18   Ht 6' (1.829 m)   Wt 259 lb 3.2 oz (117.6 kg) Comment: scale c  SpO2 96%   BMI 35.15 kg/m   Physical Exam  Constitutional: He is oriented to person, place, and time. He appears well-developed and well-nourished. No distress.  HENT:  Head: Normocephalic and atraumatic.  Eyes: Conjunctivae are normal.  Neck: Neck supple. JVD present.  Cardiovascular: Normal rate, regular rhythm and normal heart sounds.  Exam reveals no friction rub.   No murmur heard. Pulmonary/Chest: Effort normal. No respiratory distress. He has no wheezes. He has rales in the right lower field and the left lower field.  Abdominal: Soft. He  exhibits no distension.  Musculoskeletal: He exhibits edema (3+ pitting b/l LE edema).  Neurological: He is alert and oriented to person, place, and time. He exhibits normal muscle tone.  Skin: Skin is warm. Capillary refill takes less than 2 seconds.  Psychiatric: He has a normal mood and affect.  Nursing note and vitals reviewed.    ED Treatments / Results  Labs (all labs ordered are listed, but only abnormal results are displayed) Labs Reviewed  CBC - Abnormal; Notable for the following:       Result Value   WBC 3.4 (*)    Platelets 78 (*)    All other components within normal limits  COMPREHENSIVE METABOLIC PANEL - Abnormal; Notable for the following:    Glucose, Bld 105 (*)    Total Protein 6.0 (*)    AST  49 (*)    ALT 66 (*)    Total Bilirubin 1.5 (*)    GFR calc non Af Amer 59 (*)    All other components within normal limits  BRAIN NATRIURETIC PEPTIDE - Abnormal; Notable for the following:    B Natriuretic Peptide 386.4 (*)    All other components within normal limits  URINALYSIS, ROUTINE W REFLEX MICROSCOPIC  TSH  I-STAT TROPOININ, ED    EKG  EKG Interpretation  Date/Time:  Sunday April 11 2016 07:47:51 EST Ventricular Rate:  54 PR Interval:    QRS Duration: 160 QT Interval:  494 QTC Calculation: 468 R Axis:   -61 Text Interpretation:  Atrial fibrillation with slow ventricular response with premature ventricular or aberrantly conducted complexes Right bundle branch block Left anterior fascicular block  Bifascicular block  Inferior infarct , age undetermined Anterior infarct , age undetermined Abnormal ECG When compared with ECG of 08/28/2014, HEART RATE has decreased Confirmed by Roxanne Mins  MD, DAVID ([id number]) on 04/11/2016 7:52:58 AM       Radiology Dg Chest 2 View  Result Date: 04/11/2016 CLINICAL DATA:  Shortness of breath.  History of colon carcinoma EXAM: CHEST  2 VIEW COMPARISON:  August 28, 2014 FINDINGS: There is no edema or consolidation. There is  cardiomegaly with mild pulmonary venous hypertension. No adenopathy. Patient is status post coronary artery bypass grafting. There is degenerative change in the thoracic spine. There is aortic atherosclerosis peer IMPRESSION: Cardiomegaly with pulmonary vascular congestion. No edema or consolidation. There is aortic atherosclerosis. Electronically Signed   By: Lowella Grip III M.D.   On: 04/11/2016 09:04    Procedures Procedures (including critical care time)  Medications Ordered in ED Medications  sodium chloride flush (NS) 0.9 % injection 3 mL (not administered)  sodium chloride flush (NS) 0.9 % injection 3 mL (not administered)  0.9 %  sodium chloride infusion (not administered)  acetaminophen (TYLENOL) tablet 650 mg (not administered)  ondansetron (ZOFRAN) injection 4 mg (not administered)  furosemide (LASIX) injection 40 mg (not administered)  nebivolol (BYSTOLIC) tablet 10 mg (not administered)  atorvastatin (LIPITOR) tablet 20-40 mg (not administered)  apixaban (ELIQUIS) tablet 5 mg (not administered)  ALPRAZolam (XANAX) tablet 0.5 mg (not administered)  alfuzosin (UROXATRAL) 24 hr tablet 10 mg (not administered)  amiodarone (PACERONE) tablet 200 mg (not administered)  furosemide (LASIX) injection 40 mg (40 mg Intravenous Given 04/11/16 1128)     Initial Impression / Assessment and Plan / ED Course  I have reviewed the triage vital signs and the nursing notes.  Pertinent labs & imaging results that were available during my care of the patient were reviewed by me and considered in my medical decision making (see chart for details).    83 yo M with PMHx as above here with progressively worsening DOE, SOB, and LE edema. VSS. No hypoxia but he does have increased WOB. CXR shows pulm edema and BNP elevated. Trop neg, EKG non-ischemic. Suspect acute CHF exacerbation. Consulted Dr. Wynonia Lawman who will admit. IV lasix given.  Final Clinical Impressions(s) / ED Diagnoses   Final  diagnoses:  Peripheral edema  Orthopnea    New Prescriptions Current Discharge Medication List       Duffy Bruce, MD 04/11/16 1625

## 2016-04-11 NOTE — ED Notes (Signed)
Ordered lunch tray; heart healthy diet

## 2016-04-11 NOTE — H&P (Signed)
History and Physical   Admit date: 04/11/2016 Name:  Vincent Sharp Medical record number: [id number] DOB/Age:  81-23-1934  83 y.o. male  Referring Physician:   [facility]  Primary Cardiologist:  Wynonia Lawman  Primary Physician:   Reynaldo Minium  Chief complaint/reason for admission: Shortness of breath  HPI:  This 83 year old male was brought to the emergency room by his wife with increasing dyspnea.  He has a history of coronary artery disease with bypass grafting in 1992.  Because of angina he was catheterized in 2009 and found to have a widely patent LIMA graft to the LAD, the vein graft to the diagonal had occluded and there was a stenosis between the insertion of the diagonal graft and the patent LIMA graft, the right coronary artery was unbypassed and had a 40% stenosis at that time.  He was seen about 2 years ago when he had atrial fibrillation when he was admitted with pneumonia and confusion.  He was transiently treated with amiodarone and went back into sinus rhythm. Amiodarone was stopped in 2016.  He has been anticoagulated with Eliquis.  He was last seen in September with scrotal edema and unilateral edema. He was in sinus at that point.   He had an echocardiogram that showed severe concentric LVH with mild decrease in LV function and marked LA enlargement mild AS, Mild pulmonary hypertension.  .  He has had intermittent problems with confusion and has had difficulty sleeping.  He has had day night reversal and has found it difficult to wear his CPAP due to trouble with the mask irritating his face.  He was brought to the emergency room by his wife and she noticed him breathing badly last night.  He was found to be grossly volume overloaded and he was somewhat confused and gave a difficult history.  History is obtained from the wife and his daughter who is at the bedside.  He was found to be in atrial fibrillation with a controlled response today.  He has been taking his Eliquis and  does not have any missed doses.  The duration of his edema is likely several weeks but history is difficult.  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  He complains of chronic lower abdominal pain and also is seeing a urologist now at [facility].  He has had symptoms of BPH and has urinary burning and frequency at the present time.  He has been using Aleve on a regular basis.  He may have occasional chest pain but has not had any recently.  He denies syncope.  He does have some mild PND.  Some orthopnea.   Past Medical History:  Diagnosis Date  . CAD (coronary artery disease), native coronary artery 12/26/2013   CABG with LIMA to LAD SVG to OM, SVG to dx1 1992 Dr. Redmond Pulling Cath 2009 scattered irregularities Left main, occluded LAD, occluded CFX, 40% stenosis proximal RCA, widely patent OM 1 SVG, widely patent LAD LIMA graft and fills first diagonal branch but with stenosis between mammary insertion site and diagonal branch, occluded Diag 1 SVG   . Colon cancer ([place])    skin, colon  . Complex sleep apnea syndrome 05/18/2013  . Coronary artery disease   . Depression   . Essential hypertension 12/26/2013  . History of colon cancer    Treated with partial colectomy   . Hyperlipidemia   . Hypertension   . Obesity (BMI 30-39.9)   . Obesity hypoventilation syndrome ([place])   .  OSA on CPAP   . Pneumonia    as a child  . Spinal stenosis in cervical region 12/11/2012     Past Surgical History:  Procedure Laterality Date  . CATARACT EXTRACTION Bilateral   . CORONARY ARTERY BYPASS GRAFT  1992   x3, Dr Redmond Pulling surgeon,  Dr Wynonia Lawman cardiologist  . HERNIA REPAIR     right inguinal  . HYDROCELE EXCISION / REPAIR    . KNEE ARTHROSCOPY W/ MENISCAL REPAIR Right   . PARTIAL COLECTOMY    . POLYPECTOMY  1989  . SHOULDER ARTHROSCOPY WITH ROTATOR CUFF REPAIR AND SUBACROMIAL DECOMPRESSION Right 12/27/2013   Procedure: RIGHT SHOULDER ARTHROSCOPY SUBACROMIAL DECOMPRESSION DISTAL CLAVICLE RESECTION  AND ROTATOR CUFF REPAIR, BISCEPS TENOTOMY;  Surgeon: Marin Shutter, MD;  Location: [facility];  Service: Orthopedics;  Laterality: Right;  . TONSILLECTOMY     Allergies: has No Known Allergies.   Medications: Prior to Admission medications   Medication Sig Start Date End Date Taking? Authorizing Provider  alfuzosin (UROXATRAL) 10 MG 24 hr tablet Take 10 mg by mouth daily. 01/22/16  Yes Historical Provider, MD  ALPRAZolam Duanne Moron) 0.5 MG tablet Take 0.5 mg by mouth daily as needed for anxiety.   Yes Historical Provider, MD  apixaban (ELIQUIS) 5 MG TABS tablet Take 1 tablet (5 mg total) by mouth 2 (two) times daily. 08/30/14  Yes Thurnell Lose, MD  atorvastatin (LIPITOR) 20 MG tablet Take 20-40 mg by mouth daily. Take 40 mg every mon, wed, and Friday and 20 mg all other days   Yes Historical Provider, MD  Multiple Vitamin (MULTI-VITAMIN PO) Take 1 tablet by mouth daily.    Yes Historical Provider, MD  nebivolol (BYSTOLIC) 10 MG tablet Take 10 mg by mouth daily.   Yes Historical Provider, MD  Omega-3 Fatty Acids (FISH OIL) 300 MG CAPS Take 1 capsule by mouth daily.    Yes Historical Provider, MD   Family History:  Family Status  Relation Status  . Mother Deceased at age 6   CHF  . Father Deceased at age 32   cancer of pancreas  . Sister Deceased   Breast cancer  . Sister Deceased   breast cancer  . Brother Deceased   CAD  . Brother Alive   Prostate cancer  . Brother Alive  . Sister Alive   Social History:   reports that he has never smoked. He has never used smokeless tobacco. He reports that he does not drink alcohol or use drugs.   Social History   Social History Narrative   Patient lives at home with his spouseWilburn Sharp)   Patient has three children.   Married 57 yrs   Caffeine Use: 1/2 cup daily.   Patient is retired.   Patient is right-handed.     Review of Systems: He has had episodes of confusion.  He wears glasses.  He has been treated for skin cancer.  He has  significant urinary hesitancy and frequency and has nocturia.  He has noted some urinary burning recently.  He complains of lower abdominal pain that he says is due to hernia.  Has had difficulty walking and complains of significant arthritis.  He has had hemorrhoids that have intermittently bled.  Other than as noted above, the remainder of the review of systems is normal  Physical Exam: BP 138/82 (BP Location: Right Arm)   Pulse (!) 59   Temp 97.8 F (36.6 C)   Resp 18   Ht 6' (1.829 m)  Wt 115.7 kg (255 lb)   SpO2 94%   BMI 34.58 kg/m   General appearance: He is an elderly male who is a poor historian and mildly confused Head: Normocephalic, without obvious abnormality Eyes: conjunctivae/corneas clear. PERRL, EOM's intact. Fundi benign. Neck: no adenopathy, no carotid bruit, supple, symmetrical, trachea midline and JVD is difficult to assess Lungs: Reduced breath sounds bilaterally, faint rales at base Heart: The irregular rate and rhythm, normal S1 and S2, no S3, 1 to 2/6 systolic murmur Abdomen: soft, non-tender; bowel sounds normal; no masses,  no organomegaly and Somewhat distended Rectal: deferred Extremities: Extremities with 2+ peripheral edema, changes of chronic venous insufficiency, erythematous 4 x 5 cm lesion on left lower anterior leg Pulses: 2+ and symmetric Skin: Lesion noted on left lower leg which is erythematous, changes of chronic venous insufficiency Neurologic: Grossly normal   Labs: CBC  Recent Labs  04/11/16 0812  WBC 3.4*  RBC 4.73  HGB 14.3  HCT 42.5  PLT 78*  MCV 89.9  MCH 30.2  MCHC 33.6  RDW 13.1   CMP   Recent Labs  04/11/16 0812  NA 140  K 4.3  CL 106  CO2 27  GLUCOSE 105*  BUN 17  CREATININE 1.13  CALCIUM 9.2  PROT 6.0*  ALBUMIN 3.7  AST 49*  ALT 66*  ALKPHOS 81  BILITOT 1.5*  GFRNONAA 59*  GFRAA >60    BNP (last 3 results)  Recent Labs  04/11/16 0812  BNP 386.4*   EKG: Atrial fibrillation with controlled  response, right bundle branch block, left axis deviation Independently reviewed by me  Radiology: Cardiomegaly, pulmonary venous congestion, aortic atherosclerosis   IMPRESSIONS: 1.  Recurrent atrial fibrillation 2.  Congestive heart failure unclear whether systolic or diastolic with acute component Suspect diastolic in light of recent ECHO with worsening with a fib.  3.  Obstructive sleep apnea of some noncompliance with wearing the mask 4.  Hypertensive heart disease 5.  CAD with previous bypass grafting and previous occlusion of the vein graft to the diagonal with stable angina 6.  Confusion 7.  BPH with obstructive symptoms  PLAN: The patient will be admitted and will undergo intravenous diuresis.  He will have a repeat echocardiogram.  May need to have cardioversion of atrial fibrillation. I am going to go ahead and initiate amiodarone again.   Signed: Kerry Hough MD [facility] Card[facility]  04/11/2016, 12:13 PM

## 2016-04-11 NOTE — ED Notes (Signed)
Pt eating lunch family at bedside. 

## 2016-04-11 NOTE — Progress Notes (Signed)
Patient arrived to floor around 1600, alert and oriented, c/o generalized pain no shortness of breath. Family at the bedside.VSS. Afib on the monitor HR in 50-57. Will continue to monitor the patient.

## 2016-04-11 NOTE — ED Notes (Signed)
Report called to 3E

## 2016-04-12 ENCOUNTER — Inpatient Hospital Stay (HOSPITAL_COMMUNITY): Payer: Medicare Other

## 2016-04-12 ENCOUNTER — Other Ambulatory Visit (HOSPITAL_COMMUNITY): Payer: PRIVATE HEALTH INSURANCE

## 2016-04-12 DIAGNOSIS — R609 Edema, unspecified: Secondary | ICD-10-CM

## 2016-04-12 DIAGNOSIS — R188 Other ascites: Secondary | ICD-10-CM

## 2016-04-12 LAB — ECHOCARDIOGRAM COMPLETE
Height: 72 in
WEIGHTICAEL: 4161.6 [oz_av]

## 2016-04-12 LAB — BASIC METABOLIC PANEL
Anion gap: 9 (ref 5–15)
BUN: 20 mg/dL (ref 6–20)
CHLORIDE: 103 mmol/L (ref 101–111)
CO2: 27 mmol/L (ref 22–32)
Calcium: 8.9 mg/dL (ref 8.9–10.3)
Creatinine, Ser: 1.3 mg/dL — ABNORMAL HIGH (ref 0.61–1.24)
GFR calc Af Amer: 58 mL/min — ABNORMAL LOW (ref >60)
GFR calc non Af Amer: 50 mL/min — ABNORMAL LOW (ref >60)
GLUCOSE: 97 mg/dL (ref 65–99)
POTASSIUM: 3.6 mmol/L (ref 3.5–5.1)
SODIUM: 139 mmol/L (ref 135–145)

## 2016-04-12 NOTE — Progress Notes (Signed)
  Echocardiogram 2D Echocardiogram has been performed.  Vincent Sharp 04/12/2016, 4:01 PM

## 2016-04-12 NOTE — Progress Notes (Signed)
Subjective:  He is a somewhat rambling historian.  Says that his breathing is better today.  Good diuresis overnight but his weight is up today which makes no sense to me.  He is quite weak also.  No chest pain.  Objective:  Vital Signs in the last 24 hours: BP (!) 103/58 (BP Location: Right Arm)   Pulse (!) 58   Temp 97.6 F (36.4 C) (Oral)   Resp 18   Ht 6' (1.829 m)   Wt 118 kg (260 lb 1.6 oz) Comment: Pt too weak to stand.  SpO2 93%   BMI 35.28 kg/m   Physical Exam: Obese elderly white male mildly confused Lungs:  Clear Cardiac:  Irregular rhythm, normal S1 and S2, no S3 Abdomen:  Soft, nontender, no masses, significantly distended  Extremities:  2+ peripheral edema, changes of chronic venous stasis noted  Intake/Output from previous day: 02/18 0701 - 02/19 0700 In: 600 [P.O.:600] Out: 6050 [Urine:6050]  Weight Filed Weights   04/11/16 0749 04/11/16 1617 04/12/16 0328  Weight: 115.7 kg (255 lb) 117.6 kg (259 lb 3.2 oz) 118 kg (260 lb 1.6 oz)    Lab Results: Basic Metabolic Panel:  Recent Labs  04/11/16 0812 04/12/16 0454  NA 140 139  K 4.3 3.6  CL 106 103  CO2 27 27  GLUCOSE 105* 97  BUN 17 20  CREATININE 1.13 1.30*   CBC:  Recent Labs  04/11/16 0812  WBC 3.4*  HGB 14.3  HCT 42.5  MCV 89.9  PLT 78*   Cardiac Enzymes: Troponin (Point of Care Test)  Recent Labs  04/11/16 0825  TROPIPOC 0.01   Telemetry: Atrial fibrillation with controlled response  Assessment/Plan:  1.  Acute on chronic diastolic heart failure likely worsened by atrial fibrillation 2.  Paroxysmal atrial fibrillation probable persistent now 3.  Worsening renal function may be due to diuresis 4.  CAD with previous bypass grafting and occlusion of the vein graft to the diagonal 5.  Obesity 6.  Thrombocytopenia  Recommendations:  Awaiting echocardiogram today.  Amiodarone is restarted.  Check venous Dopplers as well as abdominal ultrasound.  He has not been wearing his  CPAP and this may be worsening a lot of things.  I think the memory and dementia is a lot more problemsome than is appreciated.     Kerry Hough  MD [facility] Cardiology  04/12/2016, 8:30 AM

## 2016-04-12 NOTE — Progress Notes (Signed)
Patient remains in atrial fibrillation with controlled response.  Continued good diuresis.  Echocardiogram reviewed and shows severe concentric LVH with normal systolic function, right ventricular enlargement, fairly marked right atrial enlargement, moderate to severe left atrial enlargement.  Middle daughter is in room and discussed with her and father about cardioversion scheduled for tomorrow at lunch if he is still in atrial fibrillation.  Risk of cardioversion discussed with them including risk of stroke arrhythmia or death and they're agreeable to proceed.   Note he has significant thrombocytopenia and will check this again in the morning.  Kerry Hough MD [facility] 5:26 PM

## 2016-04-12 NOTE — Progress Notes (Addendum)
*  Preliminary Results* Bilateral lower extremity venous duplex completed. Bilateral lower extremities are negative for deep vein thrombosis. There is evidence of right Baker's cyst. No evidence of left Baker's cyst.  04/12/2016 3:38 PM Maudry Mayhew, BS, RVT, RDCS, RDMS

## 2016-04-13 ENCOUNTER — Inpatient Hospital Stay (HOSPITAL_COMMUNITY): Payer: Medicare Other | Admitting: Anesthesiology

## 2016-04-13 ENCOUNTER — Encounter (HOSPITAL_COMMUNITY): Payer: Self-pay

## 2016-04-13 ENCOUNTER — Encounter (HOSPITAL_COMMUNITY)
Admission: EM | Disposition: A | Discharge status: Home / Self Care | Payer: Self-pay | Source: Home / Self Care | Attending: Cardiology

## 2016-04-13 HISTORY — PX: CARDIOVERSION: SHX1299

## 2016-04-13 LAB — CBC WITH DIFFERENTIAL/PLATELET
BASOS PCT: 0 %
Basophils Absolute: 0 10*3/uL (ref 0.0–0.1)
EOS ABS: 0.1 10*3/uL (ref 0.0–0.7)
Eosinophils Relative: 2 %
HEMATOCRIT: 42.5 % (ref 39.0–52.0)
HEMOGLOBIN: 14.4 g/dL (ref 13.0–17.0)
LYMPHS ABS: 1 10*3/uL (ref 0.7–4.0)
Lymphocytes Relative: 21 %
MCH: 30.1 pg (ref 26.0–34.0)
MCHC: 33.9 g/dL (ref 30.0–36.0)
MCV: 88.7 fL (ref 78.0–100.0)
MONO ABS: 0.5 10*3/uL (ref 0.1–1.0)
MONOS PCT: 11 %
NEUTROS ABS: 3 10*3/uL (ref 1.7–7.7)
NEUTROS PCT: 66 %
Platelets: 88 10*3/uL — ABNORMAL LOW (ref 150–400)
RBC: 4.79 MIL/uL (ref 4.22–5.81)
RDW: 12.9 % (ref 11.5–15.5)
WBC: 4.6 10*3/uL (ref 4.0–10.5)

## 2016-04-13 LAB — BASIC METABOLIC PANEL
ANION GAP: 11 (ref 5–15)
BUN: 22 mg/dL — AB (ref 6–20)
CHLORIDE: 102 mmol/L (ref 101–111)
CO2: 29 mmol/L (ref 22–32)
Calcium: 9.1 mg/dL (ref 8.9–10.3)
Creatinine, Ser: 1.37 mg/dL — ABNORMAL HIGH (ref 0.61–1.24)
GFR calc Af Amer: 54 mL/min — ABNORMAL LOW (ref >60)
GFR calc non Af Amer: 47 mL/min — ABNORMAL LOW (ref >60)
GLUCOSE: 101 mg/dL — AB (ref 65–99)
POTASSIUM: 3.4 mmol/L — AB (ref 3.5–5.1)
Sodium: 142 mmol/L (ref 135–145)

## 2016-04-13 SURGERY — CARDIOVERSION
Anesthesia: General

## 2016-04-13 MED ORDER — SODIUM CHLORIDE 0.45 % IV SOLN: INTRAVENOUS | Status: DC

## 2016-04-13 MED ORDER — LIDOCAINE HCL (CARDIAC) 20 MG/ML IV SOLN
INTRAVENOUS | Status: DC | PRN
  Administered 2016-04-13: 100 mg via INTRAVENOUS

## 2016-04-13 MED ORDER — PROPOFOL 10 MG/ML IV BOLUS
INTRAVENOUS | Status: DC | PRN
  Administered 2016-04-13: 80 mg via INTRAVENOUS

## 2016-04-13 MED ORDER — HYDROCORTISONE 1 % EX CREA
1.0000 "application " | TOPICAL_CREAM | Freq: Three times a day (TID) | CUTANEOUS | Status: DC | PRN
  Filled 2016-04-13 (×2): qty 28

## 2016-04-13 MED ORDER — EPHEDRINE SULFATE 50 MG/ML IJ SOLN
INTRAMUSCULAR | Status: DC | PRN
  Administered 2016-04-13: 10 mg via INTRAVENOUS

## 2016-04-13 MED ORDER — HYDROCORTISONE 1 % EX CREA
1.0000 "application " | TOPICAL_CREAM | Freq: Three times a day (TID) | CUTANEOUS | Status: DC | PRN
  Filled 2016-04-13: qty 28

## 2016-04-13 NOTE — Progress Notes (Signed)
Co-signed am assessment in error for nursing student.  Pt primary nurse Tiffany,RN made aware.  Ovella Manygoats,RN.

## 2016-04-13 NOTE — Anesthesia Procedure Notes (Signed)
Procedure Name: MAC Date/Time: 04/13/2016 12:12 PM Performed by: Carney Living Pre-anesthesia Checklist: Patient identified, Emergency Drugs available, Suction available, Patient being monitored and Timeout performed Patient Re-evaluated:Patient Re-evaluated prior to inductionOxygen Delivery Method: Ambu bag Preoxygenation: Pre-oxygenation with 100% oxygen

## 2016-04-13 NOTE — Progress Notes (Signed)
Pt in bed awake, no c/o pain, family at bedside, refused bed alarm on. Will continue hourly rounding.

## 2016-04-13 NOTE — Transfer of Care (Signed)
Immediate Anesthesia Transfer of Care Note  Patient: Vincent Sharp  Procedure(s) Performed: Procedure(s): CARDIOVERSION (N/A)  Patient Location: Endoscopy Unit  Anesthesia Type:General  Level of Consciousness: awake, alert , oriented and patient cooperative  Airway & Oxygen Therapy: Patient Spontanous Breathing and Patient connected to nasal cannula oxygen  Post-op Assessment: Report given to RN, Post -op Vital signs reviewed and stable and Patient moving all extremities X 4  Post vital signs: Reviewed and stable  Last Vitals:  Vitals:   04/13/16 1130 04/13/16 1230  BP: (!) 108/40   Pulse: 65 (!) 59  Resp: 12 16  Temp:      Last Pain:  Vitals:   04/13/16 0828  TempSrc: Oral  PainSc: 0-No pain      Patients Stated Pain Goal: 2 ([id number])  Complications: No apparent anesthesia complications

## 2016-04-13 NOTE — Progress Notes (Signed)
Taught patient the importance of calling for assistance to reduce risk of falls & prolong of stay.

## 2016-04-13 NOTE — Anesthesia Postprocedure Evaluation (Addendum)
Anesthesia Post Note  Patient: Vincent Sharp  Procedure(s) Performed: Procedure(s) (LRB): CARDIOVERSION (N/A)  Patient location during evaluation: PACU Anesthesia Type: General Level of consciousness: sedated Pain management: pain level controlled Vital Signs Assessment: post-procedure vital signs reviewed and stable Respiratory status: spontaneous breathing and respiratory function stable Cardiovascular status: stable Anesthetic complications: no       Last Vitals:  Vitals:   04/13/16 1130 04/13/16 1230  BP: (!) 108/40   Pulse: 65 (!) 59  Resp: 12 16  Temp:      Last Pain:  Vitals:   04/13/16 0828  TempSrc: Oral  PainSc: 0-No pain                 Emon Miggins DANIEL

## 2016-04-13 NOTE — Anesthesia Preprocedure Evaluation (Addendum)
Anesthesia Evaluation  Patient identified by MRN, date of birth, ID band Patient awake    Reviewed: Allergy & Precautions, NPO status , Patient's Chart, lab work & pertinent test results  History of Anesthesia Complications Negative for: history of anesthetic complications  Airway Mallampati: II  TM Distance: >3 FB Neck ROM: Full    Dental  (+) Teeth Intact, Partial Lower, Dental Advisory Given   Pulmonary sleep apnea ,    Pulmonary exam normal        Cardiovascular hypertension, Pt. on medications and Pt. on home beta blockers + CAD and + CABG   Rhythm:Irregular Rate:Tachycardia     Neuro/Psych PSYCHIATRIC DISORDERS Depression negative neurological ROS     GI/Hepatic negative GI ROS, Neg liver ROS,   Endo/Other  negative endocrine ROS  Renal/GU Renal InsufficiencyRenal disease     Musculoskeletal   Abdominal   Peds  Hematology   Anesthesia Other Findings   Reproductive/Obstetrics                           Anesthesia Physical Anesthesia Plan  ASA: III  Anesthesia Plan: General   Post-op Pain Management:    Induction: Intravenous  Airway Management Planned: Mask  Additional Equipment:   Intra-op Plan:   Post-operative Plan:   Informed Consent:   Dental advisory given  Plan Discussed with: Anesthesiologist, CRNA and Surgeon  Anesthesia Plan Comments:        Anesthesia Quick Evaluation

## 2016-04-13 NOTE — CV Procedure (Signed)
Electrical Cardioversion Procedure Note  Vincent Sharp   83 y.o. male MRN: [id number] DOB: 1933-01-10  Today's date: 04/13/2016  Procedure: Electrical Cardioversion  Indications:  Atrial Fibrillation  Time Out: Verified patient identification, verified procedure,medications/allergies/relevent history reviewed, required imaging and test results available.  Performed  Procedure Details  The patient was NPO after midnight. Anesthesia was administered at the beside  by Dr.Singer with 100mg  of lidocaine and 80 mg of propofol.  Cardioversion was done with synchronized biphasic defibrillation with AP pads with 120 watts.  The patient converted to normal sinus rhythm. The patient tolerated the procedure well   IMPRESSION:  Successful cardioversion of atrial fibrillation    W. Tollie Eth, Brooke Bonito. MD [facility]   04/13/2016, 12:34 PM

## 2016-04-13 NOTE — Progress Notes (Signed)
Subjective:  He had a bad night with significant confusion, agitation and sundowning.  He is mildly confused this morning.  His son-in-law is in the room this morning.  Significant diuresis with over 14 pound weight loss of questionable accuracy of scales.  Not short of breath this morning.  Remains in atrial fibrillation with cardioversion planned later.  Objective:  Vital Signs in the last 24 hours: BP 115/62 (BP Location: Right Arm)   Pulse (!) 57   Temp 97.7 F (36.5 C) (Oral)   Resp 18   Ht 6' (1.829 m)   Wt 111.7 kg (246 lb 3.2 oz)   SpO2 94%   BMI 33.39 kg/m   Physical Exam: Obese elderly white male mildly confused Lungs:  Clear Cardiac:  Irregular rhythm, normal S1 and S2, no S3 Abdomen:  Soft, nontender, no masses, significantly distended  Extremities:  1+ peripheral edema, changes of chronic venous stasis noted  Intake/Output from previous day: 02/19 0701 - 02/20 0700 In: 1188 [P.O.:1188] Out: 3150 [Urine:3150]  Weight Filed Weights   04/11/16 1617 04/12/16 0328 04/13/16 0600  Weight: 117.6 kg (259 lb 3.2 oz) 118 kg (260 lb 1.6 oz) 111.7 kg (246 lb 3.2 oz)    Lab Results: Basic Metabolic Panel:  Recent Labs  04/12/16 0454 04/13/16 0529  NA 139 142  K 3.6 3.4*  CL 103 102  CO2 27 29  GLUCOSE 97 101*  BUN 20 22*  CREATININE 1.30* 1.37*   CBC:  Recent Labs  04/11/16 0812  WBC 3.4*  HGB 14.3  HCT 42.5  MCV 89.9  PLT 78*   Cardiac Enzymes: Troponin (Point of Care Test)  Recent Labs  04/11/16 0825  TROPIPOC 0.01   Telemetry: Atrial fibrillation with controlled response  Assessment/Plan:  1.  Acute on chronic diastolic heart failure likely worsened by atrial fibrillation-Significant diuresis now 2.  Paroxysmal atrial fibrillation probable persistent now 3.  Worsening renal function may be due to diuresis 4.  CAD with previous bypass grafting and occlusion of the vein graft to the diagonal 5.  Obesity 6.  Thrombocytopenia-awaiting  CBC 7.  Significant issues with sundowning and memory  Recommendations:  Cardioversion is planned for later this morning.  Cardioversion was discussed with the son-in-law who is sitting at the bedside.  Answered numerous questions from him.  We'll discontinue intravenous Lasix and begin oral Lasix.  The son-in-law mention that memory has been a significant issue but when I talked to his primary physician yesterday he stated the family had not really brought this up with him so we will try to make sure that happens.      Kerry Hough  MD [facility] Cardiology  04/13/2016, 8:44 AM

## 2016-04-13 NOTE — Care Management Note (Signed)
Case Management Note  Patient Details  Name: Vincent Sharp MRN: [id number] Date of Birth: 1932-04-18  Subjective/Objective:    Admitted with CHF                Action/Plan: Patient lives at home with spouse; PCP is Dr Reynaldo Minium; has private insurance with [facility] with prescription drug coverage; CM following for DCP  Expected Discharge Date:  Possibly 04/17/2016               Expected Discharge Plan:  [name]  Discharge planning Services  CM Consult  Status of Service:  In process, will continue to follow  [name] [id number] 04/13/2016, 1:26 PM

## 2016-04-13 NOTE — Progress Notes (Signed)
PT Cancellation Note  Patient Details Name: Vincent Sharp MRN: [id number] DOB: 26-Jun-1932   Cancelled Treatment:    Reason Eval/Treat Not Completed: Other (comment) (family declined eval at this time due to pt resting after very restless and confused night for pt)   Feleshia Zundel B Briget Shaheed 04/13/2016, 8:25 AM  Elwyn Reach, [place]

## 2016-04-14 LAB — BASIC METABOLIC PANEL
ANION GAP: 11 (ref 5–15)
BUN: 25 mg/dL — ABNORMAL HIGH (ref 6–20)
CHLORIDE: 98 mmol/L — AB (ref 101–111)
CO2: 31 mmol/L (ref 22–32)
Calcium: 8.8 mg/dL — ABNORMAL LOW (ref 8.9–10.3)
Creatinine, Ser: 1.53 mg/dL — ABNORMAL HIGH (ref 0.61–1.24)
GFR calc Af Amer: 47 mL/min — ABNORMAL LOW (ref >60)
GFR, EST NON AFRICAN AMERICAN: 41 mL/min — AB (ref >60)
GLUCOSE: 116 mg/dL — AB (ref 65–99)
POTASSIUM: 3.3 mmol/L — AB (ref 3.5–5.1)
Sodium: 140 mmol/L (ref 135–145)

## 2016-04-14 MED ORDER — POTASSIUM CHLORIDE 20 MEQ/15ML (10%) PO SOLN
40.0000 meq | Freq: Once | ORAL | Status: AC
  Administered 2016-04-14: 40 meq via ORAL
  Filled 2016-04-14: qty 30

## 2016-04-14 NOTE — Progress Notes (Signed)
Subjective:  Tolerated cardioversion well yesterday and remains in sinus rhythm with first-degree AV block.  Edema has gone.  He has significant sundowning and did have some agitation again last night.  Is oriented this morning.  Diuretics were stopped yesterday.  Renal function has gone up some.  Potassium mildly reduced this morning.  Objective:  Vital Signs in the last 24 hours: BP 123/62 (BP Location: Right Arm)   Pulse 73   Temp 97.6 F (36.4 C) (Oral)   Resp 18   Ht 6' (1.829 m)   Wt 111.1 kg (244 lb 14.4 oz)   SpO2 95%   BMI 33.21 kg/m   Physical Exam: Obese elderly white male appears weak  Lungs:  Clear Cardiac:  Regular rhythm, normal S1 and S2, no S3 Abdomen:  Soft, nontender, no masses, significantly distended  Extremities:  Trace peripheral edema, changes of chronic venous stasis noted  Intake/Output from previous day: 02/20 0701 - 02/21 0700 In: 880 [P.O.:480; I.V.:400] Out: 915 [Urine:915]  Weight Filed Weights   04/13/16 0600 04/13/16 1130 04/14/16 0429  Weight: 111.7 kg (246 lb 3.2 oz) 111.6 kg (246 lb) 111.1 kg (244 lb 14.4 oz)    Lab Results: Basic Metabolic Panel:  Recent Labs  04/13/16 0529 04/14/16 0511  NA 142 140  K 3.4* 3.3*  CL 102 98*  CO2 29 31  GLUCOSE 101* 116*  BUN 22* 25*  CREATININE 1.37* 1.53*   CBC:  Recent Labs  04/13/16 0857  WBC 4.6  NEUTROABS 3.0  HGB 14.4  HCT 42.5  MCV 88.7  PLT 88*   Telemetry: Sinus with first-degree AV block, no recurrent atrial fibrillation, personally reviewed  Assessment/Plan:  1.  Acute on chronic diastolic heart failure likely worsened by atrial fibrillation-Significant diuresis nowAnd probably volume neutral now. 2.  Paroxysmal atrial fibrillation -now back in sinus rhythm following cardioversion  3.  Worsening renal function may be due to diuresis 4.  CAD with previous bypass grafting and occlusion of the vein graft to the diagonal 5.  Obesity 6.  Thrombocytopenia-persists 7.   Significant issues with sundowning and memory  Recommendations:  Have asked for PT to evaluate patient for safety at home.  Likely will need home health.  Watch today and hopefully discharge in morning if stable.  Suspect that sundowning will improve at home.  Will need evaluation of sleep apnea and memory issues following discharge.  Kerry Hough  MD [facility] Cardiology  04/14/2016, 9:23 AM

## 2016-04-14 NOTE — Evaluation (Signed)
Physical Therapy Evaluation Patient Details Name: Vincent Sharp MRN: [id number] DOB: [birth year] Today's Date: 04/14/2016   History of Present Illness  Pt is an 83 y/o male admitted secondary to SOB, found to have recurrent a-fib and is now s/p cardioversion (2/20). PMH including but not limited to HTN and CAD s/p CABG x3 in 1992.   Clinical Impression  Pt presented sitting OOB in recliner when PT entered room, awake and willing to participate in therapy session. Prior to admission, pt reported that he was independent with all functional mobility and ADLs. Pt currently requires min guard for transfers, min guard for safety with ambulation with LOB x1 today that required min A to maintain upright standing position. Pt would continue to benefit from skilled physical therapy services at this time while admitted and after d/c to address his below listed limitations in order to improve his overall safety and independence with functional mobility.      Follow Up Recommendations Home health PT;Supervision for mobility/OOB    Equipment Recommendations  None recommended by PT    Recommendations for Other Services       Precautions / Restrictions Precautions Precautions: Fall Restrictions Weight Bearing Restrictions: No      Mobility  Bed Mobility               General bed mobility comments: pt sitting OOB in recliner when PT entered  Transfers Overall transfer level: Needs assistance Equipment used: None Transfers: Sit to/from Stand Sit to Stand: Min guard         General transfer comment: increased time, min guard for safety  Ambulation/Gait Ambulation/Gait assistance: Min guard;Min assist Ambulation Distance (Feet): 75 Feet Assistive device: None Gait Pattern/deviations: Step-through pattern;Decreased step length - right;Decreased step length - left;Decreased stride length Gait velocity: decreased Gait velocity interpretation: Below normal speed for  age/gender General Gait Details: pt with mild unsteadiness with ambulation requiring min guard for safety with LOB x1 to his L that required min A to maintain upright standing balance.   Stairs            Wheelchair Mobility    Modified Rankin (Stroke Patients Only)       Balance Overall balance assessment: Needs assistance Sitting-balance support: Feet supported Sitting balance-Leahy Scale: Good     Standing balance support: During functional activity;No upper extremity supported Standing balance-Leahy Scale: Fair                               Pertinent Vitals/Pain Pain Assessment: No/denies pain    Home Living Family/patient expects to be discharged to:: Private residence Living Arrangements: Spouse/significant other Available Help at Discharge: Family;Available 24 hours/day Type of Home: House Home Access: Stairs to enter Entrance Stairs-Rails: None Entrance Stairs-Number of Steps: 2 Home Layout: One level Home Equipment: Shower seat      Prior Function Level of Independence: Independent               Hand Dominance   Dominant Hand: Right    Extremity/Trunk Assessment   Upper Extremity Assessment Upper Extremity Assessment: Overall WFL for tasks assessed    Lower Extremity Assessment Lower Extremity Assessment: Generalized weakness       Communication   Communication: No difficulties  Cognition Arousal/Alertness: Awake/alert Behavior During Therapy: WFL for tasks assessed/performed Overall Cognitive Status: Impaired/Different from baseline Area of Impairment: Safety/judgement         Safety/Judgement: Decreased awareness of safety;Decreased awareness  of deficits          General Comments      Exercises     Assessment/Plan    PT Assessment Patient needs continued PT services  PT Problem List Decreased strength;Decreased activity tolerance;Decreased balance;Decreased coordination;Decreased mobility;Decreased  cognition;Decreased safety awareness;Decreased knowledge of precautions       PT Treatment Interventions DME instruction;Gait training;Stair training;Functional mobility training;Therapeutic activities;Therapeutic exercise;Balance training;Neuromuscular re-education;Patient/family education    PT Goals (Current goals can be found in the Care Plan section)  Acute Rehab PT Goals Patient Stated Goal: return home tomorrow PT Goal Formulation: With patient/family Time For Goal Achievement: 04/28/16 Potential to Achieve Goals: Good    Frequency Min 3X/week   Barriers to discharge        Co-evaluation               End of Session Equipment Utilized During Treatment: Gait belt Activity Tolerance: Patient tolerated treatment well Patient left: in chair;with call bell/phone within reach;with family/visitor present Nurse Communication: Mobility status PT Visit Diagnosis: Unsteadiness on feet (R26.81)         Time: [id number] PT Time Calculation (min) (ACUTE ONLY): 15 min   Charges:   PT Evaluation $PT Eval Moderate Complexity: 1 Procedure     PT G CodesClearnce Sorrel Bonnetta Allbee 04/14/2016, 4:06 PM Sherie Don, [place], DPT [phone]

## 2016-04-15 LAB — BASIC METABOLIC PANEL
Anion gap: 8 (ref 5–15)
BUN: 23 mg/dL — AB (ref 6–20)
CALCIUM: 9 mg/dL (ref 8.9–10.3)
CO2: 29 mmol/L (ref 22–32)
Chloride: 101 mmol/L (ref 101–111)
Creatinine, Ser: 1.22 mg/dL (ref 0.61–1.24)
GFR calc Af Amer: >60 mL/min (ref >60)
GFR, EST NON AFRICAN AMERICAN: 54 mL/min — AB (ref >60)
GLUCOSE: 95 mg/dL (ref 65–99)
Potassium: 3.6 mmol/L (ref 3.5–5.1)
Sodium: 138 mmol/L (ref 135–145)

## 2016-04-15 MED ORDER — AMIODARONE HCL 200 MG PO TABS: 200.0000 mg | ORAL_TABLET | Freq: Two times a day (BID) | ORAL | 1 refills | Status: DC

## 2016-04-15 MED ORDER — FUROSEMIDE 40 MG PO TABS: 40.0000 mg | ORAL_TABLET | Freq: Every day | ORAL | 12 refills | Status: DC

## 2016-04-15 NOTE — Care Management Note (Signed)
Case Management Note  Patient Details  Name: Vincent Sharp MRN: [id number] Date of Birth: [birth year]  Subjective/Objective:    Admitted with CHF                Action/Plan: Patient lives at home with his spouse; PCP is Dr Burnard Bunting; has private insurance with [facility] with prescription drug coverage; pharmacy [facility] choice [facility]; daughter reports no problem getting his medication; [place] choice offered. Daughter chose [place]; Butch Penny with [facility] called but they cannot accept the patient's insurance. Mary with [facility]) called, they can accept the patient. Patient/ family updated, in agreement for [facility].  Expected Discharge Date:  04/15/16               Expected Discharge Plan:  [place]  Discharge planning Services  CM Consult   HH Arranged:  RN, Disease Management, PT [place] Agency:  [facility]  Status of Service:  In process, will continue to follow  [name] [id number] 04/15/2016, 10:15 AM

## 2016-04-15 NOTE — Progress Notes (Signed)
   04/15/16 0532  Vitals  ECG Heart Rate (!) 51  Cardiac Rhythm Atrial fibrillation (Notified Carlin Attridge)  Informed by CCMD that patient is in Atrial Fibrillation, went to pt's room, pt asleep, family at bedside. EKG done, showed atrial flutter.  Dr. Emilio Aspen notified, awaiting call back at this time.

## 2016-04-15 NOTE — Progress Notes (Signed)
NURSING PROGRESS NOTE  TASHEEM CONDRON [id number] Discharge Data: 04/15/2016 11:50 AM Attending Provider: Jacolyn Reedy, MD [id number], MD     Clemmie Krill to be D/C'd Home with wife per MD order.  Discussed with the patient's wife and daughter the After Visit Summary and all questions fully answered. All IV's discontinued with no bleeding noted. All belongings returned to patient for patient to take home.   Last Vital Signs:  Blood pressure (!) 107/53, pulse (!) 50, temperature 97.5 F (36.4 C), temperature source Axillary, resp. rate 18, height 6' (1.829 m), weight 110.3 kg (243 lb 1.6 oz), SpO2 97 %.  Discharge Medication List Allergies as of 04/15/2016   No Known Allergies     Medication List    TAKE these medications   alfuzosin 10 MG 24 hr tablet Commonly known as:  UROXATRAL Take 10 mg by mouth daily.   ALPRAZolam 0.5 MG tablet Commonly known as:  XANAX Take 0.5 mg by mouth daily as needed for anxiety.   amiodarone 200 MG tablet Commonly known as:  PACERONE Take 1 tablet (200 mg total) by mouth 2 (two) times daily.   apixaban 5 MG Tabs tablet Commonly known as:  ELIQUIS Take 1 tablet (5 mg total) by mouth 2 (two) times daily.   atorvastatin 20 MG tablet Commonly known as:  LIPITOR Take 20-40 mg by mouth daily. Take 40 mg every mon, wed, and Friday and 20 mg all other days   FISH OIL 300 MG Caps Take 1 capsule by mouth daily.   furosemide 40 MG tablet Commonly known as:  LASIX Take 1 tablet (40 mg total) by mouth daily.   MULTI-VITAMIN PO Take 1 tablet by mouth daily.   nebivolol 10 MG tablet Commonly known as:  BYSTOLIC Take 10 mg by mouth daily.

## 2016-04-15 NOTE — Care Management Important Message (Signed)
Important Message  Patient Details  Name: Vincent Sharp MRN: [id number] Date of Birth: 1933-01-23   Medicare Important Message Given:  Yes    [name] 04/15/2016, 11:30 AM

## 2016-04-15 NOTE — Discharge Summary (Signed)
Physician Discharge Summary  Patient ID: Vincent Sharp MRN: [id number] DOB/AGE: [birth year] 81 y.o.  Admit date: 04/11/2016 Discharge date: 04/15/2016  Primary Physician: Reynaldo Minium   Primary Discharge Diagnosis:  1.  Acute on chronic diastolic congestive heart failure  Secondary Discharge Diagnosis: 2.  Persistent atrial fibrillation and flutter transiently converted to sinus rhythm following amiodarone and cardioversion 3.  Long-term use of anticoagulation 4.  Sundowning and possible early dementia 5.  Sleep apnea currently untreated 6.  Coronary artery disease with previous bypass grafting 7.  Aortic atherosclerosis noted on x-ray 8.  BPH 9.  Asymptomatic cholelithiasis 10.  Obesity  Procedures:  Echocardiogram, cardioversion, venous Dopplers  Hospital Course: This 83 year old male was brought to the emergency room by his wife with increasing dyspnea.  He has a history of coronary artery disease with bypass grafting in 1992.  Because of angina he was catheterized in 2009 and found to have a widely patent LIMA graft to the LAD, the vein graft to the diagonal had occluded and there was a stenosis between the insertion of the diagonal graft and the patent LIMA graft, the right coronary artery was unbypassed and had a 40% stenosis at that time.  He was seen about 2 years ago when he had atrial fibrillation when he was admitted with pneumonia and confusion.  He was transiently treated with amiodarone and went back into sinus rhythm. Amiodarone was stopped in 2016.  He has been anticoagulated with Eliquis.  He was last seen in September with scrotal edema and unilateral edema. He was in sinus at that point.   He had an echocardiogram that showed severe concentric LVH with mild decrease in LV function and marked LA enlargement mild AS, Mild pulmonary hypertension.  .  He has had intermittent problems with confusion and has had difficulty sleeping.  He has had day night reversal and has found it  difficult to wear his CPAP due to trouble with the mask irritating his face.  He was brought to the emergency room by his wife and she noticed him breathing badly the day of admission.   He was found to be grossly volume overloaded and he was somewhat confused and gave a difficult history.  History was obtained from the wife and his daughter who is at the bedside.  He was found to be in atrial fibrillation with a controlled response emergency room.   He has been taking his Eliquis and does not have any missed doses.  The duration of his edema is likely several weeks but history is difficult.  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  He complains of chronic lower abdominal pain and also is seeing a urologist now at [facility].  He has had symptoms of BPH and has urinary burning and frequency at the present time.  He has been using Aleve on a regular basis.  He may have occasional chest pain but has not had any recently.  He denies syncope.  He does have some mild PND.  Some orthopnea.  Patient was admitted to a telemetry floor and remained in atrial fibrillation with controlled response.  He was given intravenous diuresis and diuresed a total of 17 pounds.  His weight dropped from 260 pounds to 243 pounds on discharge.  His edema was markedly improved.  It was felt that he had chronic diastolic heart failure with an acute component likely due to going back into atrial fibrillation with loss of atrial kick.  His echocardiogram  showed significant left ventricular hypertrophy and he also had marked left atrial enlargement and moderate right atrial enlargement with mild-to-moderate pulmonary hypertension.  He was started on amiodarone 200 mg twice a day and underwent cardioversion on 2/19 with reversion to sinus rhythm easily.  He remained in sinus rhythm with first-degree AV block but converted back to atrial fibrillation and atrial flutter early the morning of discharge.  His intravenous Lasix was  stopped because his creatinine started to climb.  His edema significantly improved.  He was seen by physical therapy who recommended home physical therapy as well as home health care RN.  The patient was able to ambulate and extensive conversations were had with multiple family members including all 3 daughters his wife as well as the son-in-law.  He has untreated sleep apnea that he says is mainly due to not being able to wear mask due to facial irritation.  He was also noted to have significant sundowning in the hospital and a history is obtained that he had significant day night reversal.  It is recommended that these concerns as well as concerns about memory raised by the family be handled by his primary physician.  He has an appointment already established with a pulmonary physician as well as a urologist to address his other noncardiac issues.  In terms of his cardiac status he will be discharged on amiodarone 200 mg twice daily.  After he has been on amiodarone another month we may attempt a final cardioversion after he has had a significant period of amiodarone loading.  He fails to remain in sinus rhythm at that point likely just need to keep him under rate control.  He is to begin furosemide 40 mg on a regular basis and was instructed to weigh daily and to report if his weight goes up.  He is to restrict sodium and was also instructed not to use nonsteroidal anti-inflammatory agents.  Discharge Exam: Blood pressure (!) 105/59, pulse 63, temperature 97.5 F (36.4 C), temperature source Axillary, resp. rate 18, height 6' (1.829 m), weight 110.3 kg (243 lb 1.6 oz), SpO2 97 %. Weight: 110.3 kg (243 lb 1.6 oz) Lungs were clear, heart was slightly irregular with no murmur, discharge weight is 243 pounds.  He had trace edema with significant changes of chronic venous insufficiency. Labs: CBC:   Lab Results  Component Value Date   WBC 4.6 04/13/2016   HGB 14.4 04/13/2016   HCT 42.5 04/13/2016    MCV 88.7 04/13/2016   PLT 88 (L) 04/13/2016    CMP:  Recent Labs Lab 04/11/16 0812  04/15/16 0420  NA 140  < > 138  K 4.3  < > 3.6  CL 106  < > 101  CO2 27  < > 29  BUN 17  < > 23*  CREATININE 1.13  < > 1.22  CALCIUM 9.2  < > 9.0  PROT 6.0*  --   --   BILITOT 1.5*  --   --   ALKPHOS 81  --   --   ALT 66*  --   --   AST 49*  --   --   GLUCOSE 105*  < > 95  < > = values in this interval not displayed.  BNP (last 3 results) BNP    Component Value Date/Time   BNP 386.4 (H) 04/11/2016 0812   Thyroid: Lab Results  Component Value Date   TSH 0.944 04/11/2016    Hemoglobin A1C: Lab Results  Component Value  Date   HGBA1C 5.5 09/08/2012     Radiology: X-ray shows cardiomegaly with pulmonary venous congestion, aortic atherosclerosis.  Abdominal ultrasound shows cholelithiasis no ascites.   EKG: Personally reviewed by me.  Initial EKG showed right bundle branch block and left axis deviation with atrial fibrillation.  Following cardioversion he was in sinus rhythm with first-degree AV block but later reverted to atrial fibrillation and atrial flutter the day of discharge.  Discharge Medications: Allergies as of 04/15/2016   No Known Allergies     Medication List    TAKE these medications   alfuzosin 10 MG 24 hr tablet Commonly known as:  UROXATRAL Take 10 mg by mouth daily.   ALPRAZolam 0.5 MG tablet Commonly known as:  XANAX Take 0.5 mg by mouth daily as needed for anxiety.   amiodarone 200 MG tablet Commonly known as:  PACERONE Take 1 tablet (200 mg total) by mouth 2 (two) times daily.   apixaban 5 MG Tabs tablet Commonly known as:  ELIQUIS Take 1 tablet (5 mg total) by mouth 2 (two) times daily.   atorvastatin 20 MG tablet Commonly known as:  LIPITOR Take 20-40 mg by mouth daily. Take 40 mg every mon, wed, and Friday and 20 mg all other days   FISH OIL 300 MG Caps Take 1 capsule by mouth daily.   furosemide 40 MG tablet Commonly known as:   LASIX Take 1 tablet (40 mg total) by mouth daily.   MULTI-VITAMIN PO Take 1 tablet by mouth daily.   nebivolol 10 MG tablet Commonly known as:  BYSTOLIC Take 10 mg by mouth daily.       Followup plans and appointments: He is to have home health RN as well as home health physical therapy.  Follow-up appointments with his primary doctor as well as myself within the next week.  He is also instructed to weigh on a daily basis.  He has a follow-up appointment set up with pulmonary for sleep apnea as well as urology at [facility].  Time spent with patient to include physician time:  45 minutes  Signed: W. Doristine Church. MD [facility] 04/15/2016, 9:06 AM

## 2016-04-19 ENCOUNTER — Ambulatory Visit (INDEPENDENT_AMBULATORY_CARE_PROVIDER_SITE_OTHER): Payer: Medicare Other | Admitting: Adult Health

## 2016-04-19 ENCOUNTER — Encounter: Payer: Self-pay | Admitting: Adult Health

## 2016-04-19 VITALS — BP 104/55 | HR 60 | Ht 72.0 in | Wt 205.6 lb

## 2016-04-19 DIAGNOSIS — G4733 Obstructive sleep apnea (adult) (pediatric): Secondary | ICD-10-CM | POA: Diagnosis not present

## 2016-04-19 NOTE — Progress Notes (Signed)
PATIENT: Vincent Sharp DOB: 06-04-1932  REASON FOR VISIT: follow up- OSA on BIPAP HISTORY FROM: patient  HISTORY OF PRESENT ILLNESS: Vincent Sharp is an 83 year old male with a history of obstructive sleep apnea on BiPAP. He returns today to reestablish care. The patient states that he was recently in the hospital for congestive heart failure and atrial fibrillation. They did  a cardioversion as well as removed 1 L fluid from the patient. His wife reports that while at the hospital his cardiologist stressed the importance of him using BiPAP therapy. For that reason he returns today to become reestablished. In the past the patient had trouble with compliance. The last time I saw the patient he was having facial treatments and the straps were irritating his face which caused the patient not to use the machine. The patient states that he is willing to try this again. He understands the importance of treating his sleep apnea. He reports daytime sleepiness. He returns today for an evaluation.  HISTORY 11/21/14: Vincent Sharp is an 83 year old male with a history of obstructive sleep apnea on BiPAP. He returns today for a compliance download. The patient states that he has not used his machine in over a year. Patient states that he had rotator cuffs surgery in November 2015 and was hospitalized in July for pneumonia. Patient states that he has a hard time using the machine because the straps irritate his face. The patient's face is very sensitive after having several treatments for skin cancer. The patient is open to trying to use the machine however he would like to find a mask and straps that do not irritate his face. He was recently sent a nasal pillow with the thin straps. He has not tried this yet. His Epworth sleepiness score is 21. When he has used the CPAP more consistently in the past his wife feels that he did receive benefit as she states that he did not have to sleep as much during the day. He  returns today for an evaluation.  HISTORY  12/13/13: Vincent Sharp is a 83 year old male with a history of OSA on BiPAP. He returns today for a 30 days compliance download. He brought his machine with him today and his reports shows an AHI of 8.0 on BiPAP. He uses his machine for average of 5 hours and 53 minutes a night. Patient compliance is 60 % -only used the machine for >4 hour for 18 days. He does have a leak at 17.9L/min. Patient reports that he gets about 6-7 hours of sleep a night. He goes to bed around 12 am and arises between 6-7am. He denies having trouble falling asleep or staying a sleep. Patient does have trouble falling and staying asleep with the BiPAP on. He recently fell while moving tree limbs. He fell on the right shoulder and hip. Since the fall he has had limited range of motion of the right arm. His urologist ordered an x-ray of the right shoulder that was unremarkable. He states that it is also painful when he lays down to sleep. He explains that this is part of the reason he does not use CPAP like he should. He has not followed up with his PCP regarding this shoulder injury. He states that he has a bruise on the left hip and it is sore but has not affected his walking.   HISTORY: 83 year old male with history of obstructive sleep Apnea . He returns today for a  30 days compliance download. He brought his machine with him today and his reports shows an AHI of 8.1 on BiPAP. He uses his machine for average of 2 hours a night. Patient did not use his machine 16 out of 30 days. His Epworth score is 14 points was previously 15 points. His fatigue severity score is 16 was previously 24. Patient reports that he gets about 6-7 hours of sleep a night. He goes to bed around 12 am and arises between 6-7am. He denies having trouble falling asleep or staying a sleep. Patient does have trouble falling and staying asleep with the BiPAP on. States that the gets up about 1-2 times a night to urinate.  Patient has had basal cell skin cancer spots removed from his face. He states that is one reason that he did not wear the BiPAP because the mask and straps put pressure on the spots he had removed. Patient was also have shoulder pain and having steroid injections and states at night he tossed and turned trying to get comfortable so the mask would come off.    REVIEW OF SYSTEMS: Out of a complete 14 system review of symptoms, the patient complains only of the following symptoms, and all other reviewed systems are negative.  See history of present illness  ALLERGIES: No Known Allergies  HOME MEDICATIONS: Outpatient Medications Prior to Visit  Medication Sig Dispense Refill  . alfuzosin (UROXATRAL) 10 MG 24 hr tablet Take 10 mg by mouth daily.  3  . ALPRAZolam (XANAX) 0.5 MG tablet Take 0.5 mg by mouth daily as needed for anxiety.    Marland Kitchen amiodarone (PACERONE) 200 MG tablet Take 1 tablet (200 mg total) by mouth 2 (two) times daily. 60 tablet 1  . apixaban (ELIQUIS) 5 MG TABS tablet Take 1 tablet (5 mg total) by mouth 2 (two) times daily. 60 tablet   . atorvastatin (LIPITOR) 20 MG tablet Take 20-40 mg by mouth daily. Take 40 mg every mon, wed, and Friday and 20 mg all other days    . furosemide (LASIX) 40 MG tablet Take 1 tablet (40 mg total) by mouth daily. 30 tablet 12  . Multiple Vitamin (MULTI-VITAMIN PO) Take 1 tablet by mouth daily.     . nebivolol (BYSTOLIC) 10 MG tablet Take 10 mg by mouth daily.    . Omega-3 Fatty Acids (FISH OIL) 300 MG CAPS Take 1 capsule by mouth daily.      No facility-administered medications prior to visit.     PAST MEDICAL HISTORY: Past Medical History:  Diagnosis Date  . CAD (coronary artery disease), native coronary artery 12/26/2013   CABG with LIMA to LAD SVG to OM, SVG to dx1 1992 Dr. Redmond Pulling Cath 2009 scattered irregularities Left main, occluded LAD, occluded CFX, 40% stenosis proximal RCA, widely patent OM 1 SVG, widely patent LAD LIMA graft and fills  first diagonal branch but with stenosis between mammary insertion site and diagonal branch, occluded Diag 1 SVG   . Colon cancer ([place])    skin, colon  . Complex sleep apnea syndrome 05/18/2013  . Coronary artery disease   . Depression   . Essential hypertension 12/26/2013  . History of colon cancer    Treated with partial colectomy   . Hyperlipidemia   . Hypertension   . Obesity (BMI 30-39.9)   . Obesity hypoventilation syndrome ([place])   . OSA on CPAP   . Pneumonia    as a child  . Spinal stenosis in cervical region 12/11/2012  PAST SURGICAL HISTORY: Past Surgical History:  Procedure Laterality Date  . CARDIOVERSION N/A 04/13/2016   Procedure: CARDIOVERSION;  Surgeon: Jacolyn Reedy, MD;  Location: [place];  Service: Cardiovascular;  Laterality: N/A;  . CATARACT EXTRACTION Bilateral   . CORONARY ARTERY BYPASS GRAFT  1992   x3, Dr Redmond Pulling surgeon,  Dr Wynonia Lawman cardiologist  . HERNIA REPAIR     right inguinal  . HYDROCELE EXCISION / REPAIR    . KNEE ARTHROSCOPY W/ MENISCAL REPAIR Right   . PARTIAL COLECTOMY    . POLYPECTOMY  1989  . SHOULDER ARTHROSCOPY WITH ROTATOR CUFF REPAIR AND SUBACROMIAL DECOMPRESSION Right 12/27/2013   Procedure: RIGHT SHOULDER ARTHROSCOPY SUBACROMIAL DECOMPRESSION DISTAL CLAVICLE RESECTION AND ROTATOR CUFF REPAIR, BISCEPS TENOTOMY;  Surgeon: Marin Shutter, MD;  Location: [place];  Service: Orthopedics;  Laterality: Right;  . TONSILLECTOMY      FAMILY HISTORY: Family History  Problem Relation Age of Onset  . Heart disease Mother     SOCIAL HISTORY: Social History   Social History  . Marital status: Married    Spouse name: [name]  . Number of children: 3  . Years of education: HS   Occupational History  . RET [employer]    N/A  .  [place] History Main Topics  . Smoking status: Never Smoker  . Smokeless tobacco: Never Used  . Alcohol use No  . Drug use: No  . Sexual activity: No   Other Topics Concern  . Not  on file   Social History Narrative   Patient lives at home with his spouseWilburn Cornelia)   Patient has three children.   Married 57 yrs   Caffeine Use: 1/2 cup daily.   Patient is retired.   Patient is right-handed.      PHYSICAL EXAM  Vitals:   04/19/16 1329  BP: (!) 104/55  Pulse: 60  Weight: 205 lb 9.6 oz (93.3 kg)  Height: 6' (1.829 m)   Body mass index is 27.88 kg/m.  Generalized: Well developed, in no acute distress   Neurological examination  Mentation: Alert oriented to time, place, history taking. Follows all commands speech and language fluent Cranial nerve II-XII: Pupils were equal round reactive to light. Extraocular movements were full, visual field were full on confrontational test. Facial sensation and strength were normal. Uvula tongue midline. Head turning and shoulder shrug  were normal and symmetric.Neck circumference 16 and half inches Mallampati 4+ Motor: The motor testing reveals 5 over 5 strength of all 4 extremities. Good symmetric motor tone is noted throughout.  Sensory: Sensory testing is intact to soft touch on all 4 extremities. No evidence of extinction is noted.  Gait and station: Gait is slightly unsteady.    DIAGNOSTIC DATA (LABS, IMAGING, TESTING) - I reviewed patient records, labs, notes, testing and imaging myself where available.  Lab Results  Component Value Date   WBC 4.6 04/13/2016   HGB 14.4 04/13/2016   HCT 42.5 04/13/2016   MCV 88.7 04/13/2016   PLT 88 (L) 04/13/2016      Component Value Date/Time   NA 138 04/15/2016 0420   K 3.6 04/15/2016 0420   CL 101 04/15/2016 0420   CO2 29 04/15/2016 0420   GLUCOSE 95 04/15/2016 0420   BUN 23 (H) 04/15/2016 0420   CREATININE 1.22 04/15/2016 0420   CALCIUM 9.0 04/15/2016 0420   PROT 6.0 (L) 04/11/2016 0812   ALBUMIN 3.7 04/11/2016 0812   AST 49 (H) 04/11/2016 9518  ALT 66 (H) 04/11/2016 0812   ALKPHOS 81 04/11/2016 0812   BILITOT 1.5 (H) 04/11/2016 0812   GFRNONAA 54 (L)  04/15/2016 0420   GFRAA >60 04/15/2016 0420    Lab Results  Component Value Date   HGBA1C 5.5 09/08/2012   Lab Results  Component Value Date   VITAMINB12 1,225 (H) 09/08/2012   Lab Results  Component Value Date   TSH 0.944 04/11/2016      ASSESSMENT AND PLAN 83 y.o. year old male  has a past medical history of CAD (coronary artery disease), native coronary artery (12/26/2013); Colon cancer ([place]); Complex sleep apnea syndrome (05/18/2013); Coronary artery disease; Depression; Essential hypertension (12/26/2013); History of colon cancer; Hyperlipidemia; Hypertension; Obesity (BMI 30-39.9); Obesity hypoventilation syndrome ([place]); OSA on CPAP; Pneumonia; and Spinal stenosis in cervical region (12/11/2012). here with:  1. Obstructive sleep apnea on BiPAP  The patient has not been seen since 2016. We will have the patient stop by the sleep lab to have his mask refitted. The patient would like to switch is a 82 to use the BiPAP nightly for greater than 4 hours each night. I again reviewed the importance of treating his sleep apnea and the risk associated with untreated sleep apnea. Patient verbalized understanding. He will return in 3-4 months for a compliance download.  I spent 15 minutes with the patient 50% of this time was spent counseling the patient on risk associated with untreated sleep apnea.   Ward Givens, MSN, NP-C 04/19/2016, 1:28 PM [facility] [address], [place], [place] [address] [phone]

## 2016-04-19 NOTE — Progress Notes (Signed)
I agree with the assessment and plan as directed by NP .The patient is known to me .   Jahir Halt, MD  

## 2016-04-19 NOTE — Patient Instructions (Signed)
Mask refitting today New supplies and orders will be sent to [facility] If your symptoms worsen or you develop new symptoms please let us know.

## 2016-04-19 NOTE — Addendum Note (Signed)
Addended by: Trudie Buckler on: 04/19/2016 02:33 PM   Modules accepted: Orders

## 2016-04-20 ENCOUNTER — Telehealth: Payer: Self-pay | Admitting: *Deleted

## 2016-04-20 NOTE — Telephone Encounter (Signed)
LMVM for Ronalee Belts, at [facility], to see if they are pts DME.   Fax confirmation received (for cpap supplies.

## 2016-05-03 ENCOUNTER — Telehealth: Payer: Self-pay

## 2016-05-03 DIAGNOSIS — G4733 Obstructive sleep apnea (adult) (pediatric): Secondary | ICD-10-CM

## 2016-05-03 DIAGNOSIS — Z9989 Dependence on other enabling machines and devices: Principal | ICD-10-CM

## 2016-05-03 NOTE — Telephone Encounter (Signed)
Patient came to sleep lab and was fitted with a Airfit 20 large. He would like a script sent to [facility] for supplies. Thanks

## 2016-05-04 NOTE — Telephone Encounter (Signed)
Ordered placed and signed

## 2016-05-05 ENCOUNTER — Telehealth: Payer: Self-pay | Admitting: *Deleted

## 2016-05-05 NOTE — Telephone Encounter (Signed)
-----   Message from Patsy Baltimore sent at 05/05/2016 12:55 PM EDT ----- Regarding: RE: capa supplies Lyn Hollingshead, This will be a new pt for [facility] I have pulled the referral and pt information along with the sleep study info I found in EPIC and sent into office for review.   I'll let you know if we need anything else. Thanks.   Angie ----- Message ----- From: Brandon Melnick, RN Sent: 05/04/2016   4:36 PM To: Patsy Baltimore Subject: capa supplies                                  I have cpap order for new supplies for this pt in EPIC.  Thanks,   Liane Comber, RN

## 2016-05-06 ENCOUNTER — Other Ambulatory Visit: Payer: Self-pay | Admitting: Cardiology

## 2016-05-06 NOTE — H&P (Signed)
[name]    Date of visit:  05/06/2016 DOB:  17-Nov-1932    Age:  83 yrs. Medical record number:  [id number]     Account number:  [id number] Primary Care Provider: Burnard Bunting A ____________________________ CURRENT DIAGNOSES  1. Persistent atrial fibrillation  2. CAD Native without angina  3. Chronic diastolic heart failure  4. Hypertensive heart disease without heart failure  5. Obesity  6. Hyperlipidemia  7. Long term (current) use of anticoagulants  8. Atherosclerosis of aorta  9. Chronic venous insufficiency  10. Personal history of other malignant neoplasm of large intestine  11. Presence of aortocoronary bypass graft  12. Paroxysmal atrial fibrillation  13. Edema  14. Sleep apnea ____________________________ ALLERGIES  No Known Drug Allergies ____________________________ MEDICATIONS  1. Fish Oil 300 mg-1,000 mg capsule, BID  2. multivitamin tablet, 1 p.o. daily  3. Claritin 10 mg tablet, PRN  4. finasteride 5 mg tablet, 1 p.o. daily  5. Eliquis 5 mg tablet, BID  6. nitroglycerin 0.4 mg sublingual tablet, Take as directed  7. alfuzosin ER 10 mg tablet,extended release 24 hr, 1 p.o. daily  8. Bystolic 10 mg tablet, 1 p.o. daily  9. alprazolam 0.5 mg tablet, 1 p.o. daily PRN anxiety  10. amiodarone 200 mg tablet, BID  11. atorvastatin 40 mg tablet, Take 40 mg MWF and 20 mg all other days  12. furosemide 20 mg tablet, 1 p.o. daily ____________________________ CHIEF COMPLAINTS  Followup of Persistent atrial fibrillation ____________________________ HISTORY OF PRESENT ILLNESS Patient seen for cardiac followup. He has a history of persistent atrial fibrillation and I cardioverted him in the hospital but he reverted back to sinus rhythm. He continues to feel fatigued but is not having as much dyspnea. His weight is up somewhat. He continues to feel modestly fatigued. The fall was that he would continue on amiodarone for a loading dose for another month and then come  back in for cardioversion and he returns today. He remains in atrial fibrillation today. He is not currently having much in the way of edema or shortness of breath. He denies angina. He has not missed any doses of his anticoagulation. He denies PND, orthopnea or claudication. ____________________________ PAST HISTORY  Past Medical Illnesses:  hypertension, hyperlipidemia, sleep apnea, history of colon cancer treated with surgery, obesity, skin cancer, depression, history of cellulitis, shingles 2013, chronic venous insufficiency;  Cardiovascular Illnesses:  CAD, atrial flutter-paroxysmal, atrial fibrillation;  Infectious Diseases:  no previous history of significant infectious diseases;  Surgical Procedures:  no previous surgical procedures;  Trauma History:  no previous history of significant trauma;  NYHA Classification:  I;  Canadian Angina Classification:  Class 1: Angina with strenuous Exercise;  Cardiology Procedures-Invasive:  cardiac cath (left) November 2009, CABG with LIMA to LAD SVG to OM, SVG to dx1 1992 Dr. Redmond Pulling;  Cardiology Procedures-Noninvasive:  echocardiogram February 2018;  Cardiac Cath Results:  scattered irregularities Left main, occluded LAD, occluded CFX, 40% stenosis proximal RCA, widely patent OM 1 SVG, widely patent LAD LIMA graft and fills first diagonal branch but with stenosis between mammary insertion site and diagonal branch, occluded Diag 1 SVG;  LVEF of 50% documented via echocardiogram on 04/11/2016,   ____________________________ CARDIO-PULMONARY TEST DATES EKG Date:  05/06/2016;   Cardiac Cath Date:  01/11/2008;  CABG: 07/26/1990;  Echocardiography Date: 04/11/2016;  Chest Xray Date: 01/02/2008;   ____________________________ SOCIAL HISTORY Alcohol Use:  does not use alcohol;  Smoking:  nonsmoker;  Diet:  regular diet;  Lifestyle:  married;  Exercise:  some exercise;  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[employer];  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 wife;    ____________________________ REVIEW OF SYSTEMS General:  obesity, weight gain of 3 pounds Eyes: wears eye glasses/contact lenses, cataract extraction with lens implants bilaterally, decreased acuity left eye Respiratory: mild dyspnea with exertion Cardiovascular:  please review HPI Abdominal: denies dyspepsia, GI bleeding, constipation, or diarrhea Genitourinary-Male: frequency, incontinence Neurological:  memory deficit  ____________________________ PHYSICAL EXAMINATION VITAL SIGNS  Blood Pressure:  110/70 Sitting, Right arm, large cuff  , 116/80 Standing, Right arm and large cuff   Pulse:  56/min. Weight:  256.00 lbs. Height:  72"BMI: 34  Constitutional:  pleasant white male in no acute distress, moderately obese Skin:  warm and dry to touch, no apparent skin lesions, or masses noted. Head:  normocephalic, normal hair pattern, no masses or tenderness Chest:  normal symmetry, clear to auscultation, healed median sternotomy scar Cardiac:  irregular rhythm, normal S1 and S2, no S3 or S4, no murmur Peripheral Pulses:  the femoral,dorsalis pedis, and posterior tibial pulses are full and equal bilaterally with no bruits auscultated. Extremities & Back:  well healed saphenous vein donor site RLE, bilateral venous insufficiency changes present, 2+ edema right leg, Right shoulder with limited ROM Neurological:  no gross motor or sensory deficits noted, affect appropriate, oriented x3. ____________________________ MOST RECENT LIPID PANEL 04/29/15  CHOL TOTL 204 mg/dL, TRIGLYCER 165 mg/dL, HDL 41 mg/dL, CHOL/HDL 5.0 Ratio, [id number] 33 mg/dL and LDL 130 mg/dL ____________________________ IMPRESSIONS/PLAN  1. Persistent atrial fibrillation now loaded on amiodarone 2. Long-term use of anticoagulation without complications 3. Chronic diastolic heart failure 4. Hypertensive heart disease  Recommendations:  Cardioversion discussed with the patient including risks of stroke, arrhythmia, death, or  anesthesia risks. The patient understands and is willing to proceed. we'll plan to do this on Monday. Lab work done today. ____________________________ Cleda Clarks  1. Basic Metabolic Panel: Today  2. CBC w/out Diff: Today  3. 12 Lead EKG: Today  4. Cardioversion                        ____________________________ Cardiology Physician:  Kerry Hough MD [facility]

## 2016-05-12 ENCOUNTER — Institutional Professional Consult (permissible substitution): Payer: PRIVATE HEALTH INSURANCE | Admitting: Pulmonary Disease

## 2016-05-17 ENCOUNTER — Ambulatory Visit (HOSPITAL_COMMUNITY)
Admission: RE | Admit: 2016-05-17 | Discharge: 2016-05-17 | Disposition: A | Discharge status: Home / Self Care | Payer: Medicare Other | Source: Ambulatory Visit | Attending: Cardiology | Admitting: Cardiology

## 2016-05-17 ENCOUNTER — Encounter (HOSPITAL_COMMUNITY)
Admission: RE | Disposition: A | Discharge status: Home / Self Care | Payer: Self-pay | Source: Ambulatory Visit | Attending: Cardiology

## 2016-05-17 ENCOUNTER — Encounter (HOSPITAL_COMMUNITY): Payer: Self-pay

## 2016-05-17 ENCOUNTER — Ambulatory Visit (HOSPITAL_COMMUNITY): Payer: Medicare Other | Admitting: Anesthesiology

## 2016-05-17 DIAGNOSIS — F329 Major depressive disorder, single episode, unspecified: Secondary | ICD-10-CM | POA: Insufficient documentation

## 2016-05-17 DIAGNOSIS — I25118 Atherosclerotic heart disease of native coronary artery with other forms of angina pectoris: Secondary | ICD-10-CM | POA: Diagnosis not present

## 2016-05-17 DIAGNOSIS — I480 Paroxysmal atrial fibrillation: Secondary | ICD-10-CM | POA: Diagnosis present

## 2016-05-17 DIAGNOSIS — Z6833 Body mass index (BMI) 33.0-33.9, adult: Secondary | ICD-10-CM | POA: Diagnosis not present

## 2016-05-17 DIAGNOSIS — E785 Hyperlipidemia, unspecified: Secondary | ICD-10-CM | POA: Diagnosis not present

## 2016-05-17 DIAGNOSIS — R000 Tachycardia, unspecified: Secondary | ICD-10-CM | POA: Diagnosis not present

## 2016-05-17 DIAGNOSIS — Z79899 Other long term (current) drug therapy: Secondary | ICD-10-CM | POA: Insufficient documentation

## 2016-05-17 DIAGNOSIS — Z85828 Personal history of other malignant neoplasm of skin: Secondary | ICD-10-CM | POA: Insufficient documentation

## 2016-05-17 DIAGNOSIS — G4730 Sleep apnea, unspecified: Secondary | ICD-10-CM | POA: Diagnosis not present

## 2016-05-17 DIAGNOSIS — I481 Persistent atrial fibrillation: Secondary | ICD-10-CM | POA: Insufficient documentation

## 2016-05-17 DIAGNOSIS — Z951 Presence of aortocoronary bypass graft: Secondary | ICD-10-CM | POA: Insufficient documentation

## 2016-05-17 DIAGNOSIS — E669 Obesity, unspecified: Secondary | ICD-10-CM | POA: Diagnosis not present

## 2016-05-17 DIAGNOSIS — I440 Atrioventricular block, first degree: Secondary | ICD-10-CM | POA: Diagnosis not present

## 2016-05-17 DIAGNOSIS — I700 Atherosclerosis of aorta: Secondary | ICD-10-CM | POA: Insufficient documentation

## 2016-05-17 DIAGNOSIS — I5032 Chronic diastolic (congestive) heart failure: Secondary | ICD-10-CM | POA: Insufficient documentation

## 2016-05-17 DIAGNOSIS — I4892 Unspecified atrial flutter: Secondary | ICD-10-CM | POA: Insufficient documentation

## 2016-05-17 DIAGNOSIS — Z85038 Personal history of other malignant neoplasm of large intestine: Secondary | ICD-10-CM | POA: Diagnosis not present

## 2016-05-17 DIAGNOSIS — I872 Venous insufficiency (chronic) (peripheral): Secondary | ICD-10-CM | POA: Insufficient documentation

## 2016-05-17 DIAGNOSIS — Z7901 Long term (current) use of anticoagulants: Secondary | ICD-10-CM | POA: Diagnosis not present

## 2016-05-17 DIAGNOSIS — I110 Hypertensive heart disease with heart failure: Secondary | ICD-10-CM | POA: Insufficient documentation

## 2016-05-17 DIAGNOSIS — I4510 Unspecified right bundle-branch block: Secondary | ICD-10-CM | POA: Diagnosis not present

## 2016-05-17 HISTORY — PX: CARDIOVERSION: SHX1299

## 2016-05-17 SURGERY — CARDIOVERSION
Anesthesia: General

## 2016-05-17 MED ORDER — PROPOFOL 10 MG/ML IV BOLUS
INTRAVENOUS | Status: DC | PRN
  Administered 2016-05-17: 80 mg via INTRAVENOUS

## 2016-05-17 MED ORDER — LIDOCAINE HCL (CARDIAC) 20 MG/ML IV SOLN
INTRAVENOUS | Status: DC | PRN
  Administered 2016-05-17: 100 mg via INTRAVENOUS

## 2016-05-17 MED ORDER — SODIUM CHLORIDE 0.9 % IV SOLN
INTRAVENOUS | Status: DC
  Administered 2016-05-17: 500 mL via INTRAVENOUS

## 2016-05-17 NOTE — H&P (Signed)
Vincent Sharp    Date of visit:  05/06/2016 DOB:  January 07, 1933    Age:  83 yrs. Medical record number:  [id number]     Account number:  [id number] Primary Care Provider: Burnard Bunting A ____________________________ CURRENT DIAGNOSES  1. Persistent atrial fibrillation  2. CAD Native without angina  3. Chronic diastolic heart failure  4. Hypertensive heart disease without heart failure  5. Obesity  6. Hyperlipidemia  7. Long term (current) use of anticoagulants  8. Atherosclerosis of aorta  9. Chronic venous insufficiency  10. Personal history of other malignant neoplasm of large intestine  11. Presence of aortocoronary bypass graft  12. Paroxysmal atrial fibrillation  13. Edema  14. Sleep apnea ____________________________ ALLERGIES  No Known Drug Allergies ____________________________ MEDICATIONS  1. Fish Oil 300 mg-1,000 mg capsule, BID  2. multivitamin tablet, 1 p.o. daily  3. Claritin 10 mg tablet, PRN  4. finasteride 5 mg tablet, 1 p.o. daily  5. Eliquis 5 mg tablet, BID  6. nitroglycerin 0.4 mg sublingual tablet, Take as directed  7. alfuzosin ER 10 mg tablet,extended release 24 hr, 1 p.o. daily  8. Bystolic 10 mg tablet, 1 p.o. daily  9. alprazolam 0.5 mg tablet, 1 p.o. daily PRN anxiety  10. amiodarone 200 mg tablet, BID  11. atorvastatin 40 mg tablet, Take 40 mg MWF and 20 mg all other days  12. furosemide 20 mg tablet, 1 p.o. daily ____________________________ CHIEF COMPLAINTS  Followup of Persistent atrial fibrillation ____________________________ HISTORY OF PRESENT ILLNESS Patient seen for cardiac followup. He has a history of persistent atrial fibrillation and I cardioverted him in the hospital but he reverted back to sinus rhythm. He continues to feel fatigued but is not having as much dyspnea. His weight is up somewhat. He continues to feel modestly fatigued. The fall was that he would continue on amiodarone for a loading dose for another month and then come  back in for cardioversion and he returns today. He remains in atrial fibrillation today. He is not currently having much in the way of edema or shortness of breath. He denies angina. He has not missed any doses of his anticoagulation. He denies PND, orthopnea or claudication. ____________________________ PAST HISTORY  Past Medical Illnesses:  hypertension, hyperlipidemia, sleep apnea, history of colon cancer treated with surgery, obesity, skin cancer, depression, history of cellulitis, shingles 2013, chronic venous insufficiency;  Cardiovascular Illnesses:  CAD, atrial flutter-paroxysmal, atrial fibrillation;  Infectious Diseases:  no previous history of significant infectious diseases;  Surgical Procedures:  no previous surgical procedures;  Trauma History:  no previous history of significant trauma;  NYHA Classification:  I;  Canadian Angina Classification:  Class 1: Angina with strenuous Exercise;  Cardiology Procedures-Invasive:  cardiac cath (left) November 2009, CABG with LIMA to LAD SVG to OM, SVG to dx1 1992 Dr. Redmond Pulling;  Cardiology Procedures-Noninvasive:  echocardiogram February 2018;  Cardiac Cath Results:  scattered irregularities Left main, occluded LAD, occluded CFX, 40% stenosis proximal RCA, widely patent OM 1 SVG, widely patent LAD LIMA graft and fills first diagonal branch but with stenosis between mammary insertion site and diagonal branch, occluded Diag 1 SVG;  LVEF of 50% documented via echocardiogram on 04/11/2016,   ____________________________ CARDIO-PULMONARY TEST DATES EKG Date:  05/06/2016;   Cardiac Cath Date:  01/11/2008;  CABG: 07/26/1990;  Echocardiography Date: 04/11/2016;  Chest Xray Date: 01/02/2008;   ____________________________ SOCIAL HISTORY Alcohol Use:  does not use alcohol;  Smoking:  nonsmoker;  Diet:  regular diet;  Lifestyle:  married;  Exercise:  some exercise;  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[employer];  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 wife;    ____________________________ REVIEW OF SYSTEMS General:  obesity, weight gain of 3 pounds Eyes: wears eye glasses/contact lenses, cataract extraction with lens implants bilaterally, decreased acuity left eye Respiratory: mild dyspnea with exertion Cardiovascular:  please review HPI Abdominal: denies dyspepsia, GI bleeding, constipation, or diarrhea Genitourinary-Male: frequency, incontinence Neurological:  memory deficit  ____________________________ PHYSICAL EXAMINATION VITAL SIGNS  Blood Pressure:  110/70 Sitting, Right arm, large cuff  , 116/80 Standing, Right arm and large cuff   Pulse:  56/min. Weight:  256.00 lbs. Height:  72"BMI: 34  Constitutional:  pleasant white male in no acute distress, moderately obese Skin:  warm and dry to touch, no apparent skin lesions, or masses noted. Head:  normocephalic, normal hair pattern, no masses or tenderness Chest:  normal symmetry, clear to auscultation, healed median sternotomy scar Cardiac:  irregular rhythm, normal S1 and S2, no S3 or S4, no murmur Peripheral Pulses:  the femoral,dorsalis pedis, and posterior tibial pulses are full and equal bilaterally with no bruits auscultated. Extremities & Back:  well healed saphenous vein donor site RLE, bilateral venous insufficiency changes present, 2+ edema right leg, Right shoulder with limited ROM Neurological:  no gross motor or sensory deficits noted, affect appropriate, oriented x3. ____________________________ MOST RECENT LIPID PANEL 04/29/15  CHOL TOTL 204 mg/dL, TRIGLYCER 165 mg/dL, HDL 41 mg/dL, CHOL/HDL 5.0 Ratio, 193790 33 mg/dL and LDL 130 mg/dL ____________________________ IMPRESSIONS/PLAN  1. Persistent atrial fibrillation now loaded on amiodarone 2. Long-term use of anticoagulation without complications 3. Chronic diastolic heart failure 4. Hypertensive heart disease  Recommendations:  Cardioversion discussed with the patient including risks of stroke, arrhythmia, death, or  anesthesia risks. The patient understands and is willing to proceed. we'll plan to do this on the 26th. Lab work done today. ____________________________ Cleda Clarks  1. Basic Metabolic Panel: Today  2. CBC w/out Diff: Today  3. 12 Lead EKG: Today  4. Cardioversion                        ____________________________ Cardiology Physician:  Kerry Hough MD [facility]

## 2016-05-17 NOTE — Anesthesia Preprocedure Evaluation (Signed)
Anesthesia Evaluation  Patient identified by MRN, date of birth, ID band Patient awake    Reviewed: Allergy & Precautions, H&P , NPO status , Patient's Chart, lab work & pertinent test results, reviewed documented beta blocker date and time   History of Anesthesia Complications Negative for: history of anesthetic complications  Airway Mallampati: II  TM Distance: >3 FB Neck ROM: Full    Dental no notable dental hx. (+) Teeth Intact, Partial Lower, Dental Advisory Given   Pulmonary sleep apnea ,    Pulmonary exam normal breath sounds clear to auscultation       Cardiovascular hypertension, Pt. on medications and Pt. on home beta blockers + CAD and + CABG   Rhythm:Irregular Rate:Tachycardia     Neuro/Psych PSYCHIATRIC DISORDERS Depression negative neurological ROS     GI/Hepatic negative GI ROS, Neg liver ROS,   Endo/Other  negative endocrine ROS  Renal/GU Renal InsufficiencyRenal disease     Musculoskeletal   Abdominal   Peds  Hematology   Anesthesia Other Findings 60% EF  Reproductive/Obstetrics                             Anesthesia Physical  Anesthesia Plan  ASA: III  Anesthesia Plan: General   Post-op Pain Management:    Induction: Intravenous  Airway Management Planned: Mask  Additional Equipment:   Intra-op Plan:   Post-operative Plan:   Informed Consent: I have reviewed the patients History and Physical, chart, labs and discussed the procedure including the risks, benefits and alternatives for the proposed anesthesia with the patient or authorized representative who has indicated his/her understanding and acceptance.   Dental advisory given  Plan Discussed with: Anesthesiologist, CRNA and Surgeon  Anesthesia Plan Comments:         Anesthesia Quick Evaluation

## 2016-05-17 NOTE — Discharge Instructions (Signed)
Electrical Cardioversion, Care After °This sheet gives you information about how to care for yourself after your procedure. Your health care provider may also give you more specific instructions. If you have problems or questions, contact your health care provider. °What can I expect after the procedure? °After the procedure, it is common to have: °· Some redness on the skin where the shocks were given. °Follow these instructions at home: °· Do not drive for 24 hours if you were given a medicine to help you relax (sedative). °· Take over-the-counter and prescription medicines only as told by your health care provider. °· Ask your health care provider how to check your pulse. Check it often. °· Rest for 48 hours after the procedure or as told by your health care provider. °· Avoid or limit your caffeine use as told by your health care provider. °Contact a health care provider if: °· You feel like your heart is beating too quickly or your pulse is not regular. °· You have a serious muscle cramp that does not go away. °Get help right away if: °· You have discomfort in your chest. °· You are dizzy or you feel faint. °· You have trouble breathing or you are short of breath. °· Your speech is slurred. °· You have trouble moving an arm or leg on one side of your body. °· Your fingers or toes turn cold or blue. °This information is not intended to replace advice given to you by your health care provider. Make sure you discuss any questions you have with your health care provider. °Document Released: 11/29/2012 Document Revised: 09/12/2015 Document Reviewed: 08/15/2015 °Elsevier Interactive Patient Education © 2017 [employer] ° °

## 2016-05-17 NOTE — Transfer of Care (Signed)
Immediate Anesthesia Transfer of Care Note  Patient: Vincent Sharp  Procedure(s) Performed: Procedure(s): CARDIOVERSION (N/A)  Patient Location: PACU and Endoscopy Unit  Anesthesia Type:General  Level of Consciousness: awake, alert , oriented and patient cooperative  Airway & Oxygen Therapy: Patient Spontanous Breathing and Patient connected to nasal cannula oxygen  Post-op Assessment: Report given to RN and Post -op Vital signs reviewed and stable  Post vital signs: Reviewed and stable  Last Vitals:  Vitals:   05/17/16 1104  BP: (!) 106/53  Pulse: (!) 50  Resp: 13    Last Pain:  Vitals:   05/17/16 1104  TempSrc: Oral         Complications: No apparent anesthesia complications

## 2016-05-17 NOTE — CV Procedure (Signed)
Electrical Cardioversion Procedure Note  Vincent Sharp   83 y.o. male MRN: [id number] DOB: August 28, 1932  Today's date: 05/17/2016  Procedure: Electrical Cardioversion  Indications:  Atrial Fibrillation  Time Out: Verified patient identification, verified procedure,medications/allergies/relevent history reviewed, required imaging and test results available.  Performed  Procedure Details  The patient was NPO after midnight. Anesthesia was administered at the beside  by Dr. Lyndle Herrlich with 100 mg of lidocaine followed by 80 mg of propofol.  Cardioversion was done with synchronized biphasic defibrillation with AP pads with 120 watts.  The patient converted to normal sinus rhythm. The patient tolerated the procedure well   IMPRESSION:  Successful cardioversion of atrial fibrillation    W. Tollie Eth, Brooke Bonito. MD [facility]   05/17/2016, 1:10 PM

## 2016-05-18 ENCOUNTER — Encounter (HOSPITAL_COMMUNITY): Payer: Self-pay | Admitting: Cardiology

## 2016-05-19 NOTE — Anesthesia Postprocedure Evaluation (Signed)
Anesthesia Post Note  Patient: Vincent Sharp  Procedure(s) Performed: Procedure(s) (LRB): CARDIOVERSION (N/A)  Patient location during evaluation: PACU Anesthesia Type: General Level of consciousness: awake and alert Pain management: pain level controlled Vital Signs Assessment: post-procedure vital signs reviewed and stable Respiratory status: spontaneous breathing, nonlabored ventilation, respiratory function stable and patient connected to nasal cannula oxygen Cardiovascular status: blood pressure returned to baseline and stable Postop Assessment: no signs of nausea or vomiting Anesthetic complications: no       Last Vitals:  Vitals:   05/17/16 1340 05/17/16 1350  BP: 96/60 (!) 98/52  Pulse: (!) 44 (!) 48  Resp: 14 18  Temp:      Last Pain:  Vitals:   05/17/16 1104  TempSrc: Oral                 Amyia Lodwick EDWARD

## 2016-06-30 IMAGING — CT CT ABD-PELV W/ CM
2 of 5 series · 16 of 46 positions shown, 18 images · IV contrast (Omni 300)
Comparison: Abdominal ultrasound earlier today. CT abdomen and
pelvis 09/10/2010.

CLINICAL DATA: Chest pain, chest congestion and cough beginning
08/26/2014. Altered mental status. Abnormal liver function tests and
possible liver mass by ultrasound.

EXAM:
CT ABDOMEN AND PELVIS WITH CONTRAST
TECHNIQUE: Multidetector CT imaging of the abdomen and pelvis was performed
using the standard protocol following bolus administration of
intravenous contrast.
CONTRAST:  100mL OMNIPAQUE IOHEXOL 300 MG/ML  SOLN

[Series 2: abd/ pelvis 5.0 i30f 1 · axial · 0.83mm/px · z∈[+906,+1366]mm · 13 of 104 slices shown, 15 images]
[im 6/104  soft-tissue]
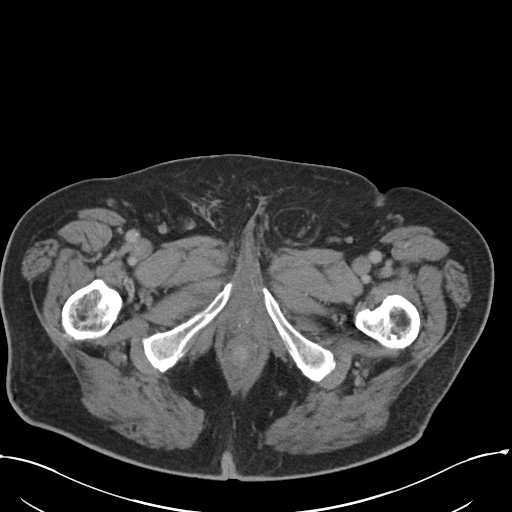
[im 6/104  bone]
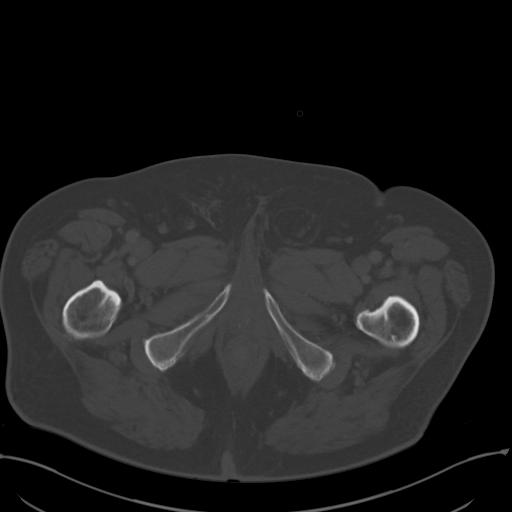
[im 12/104  soft-tissue]
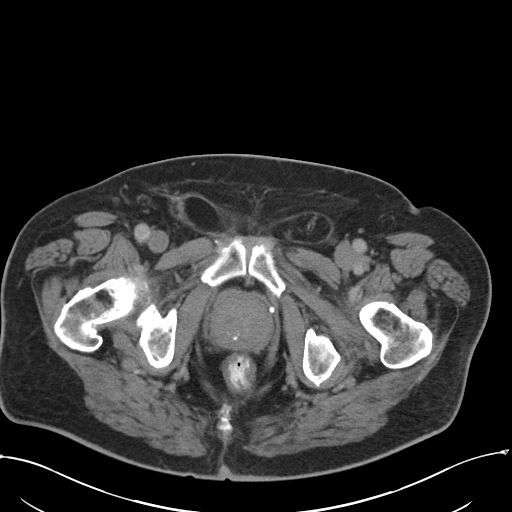
[im 23/104  soft-tissue]
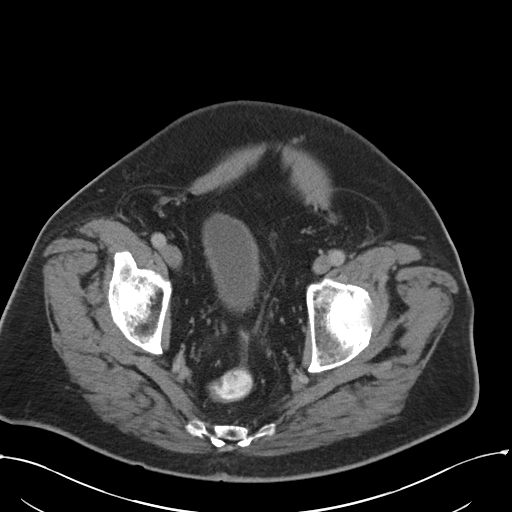
[im 29/104  soft-tissue]
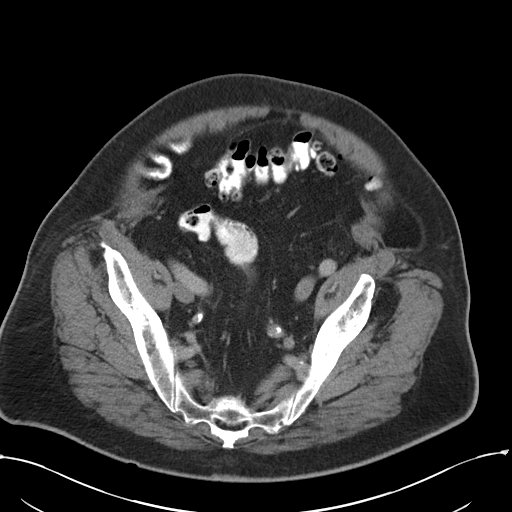
[im 35/104  soft-tissue]
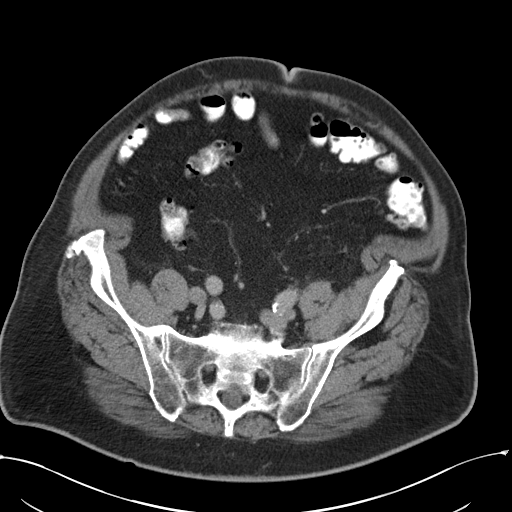
[im 46/104  soft-tissue]
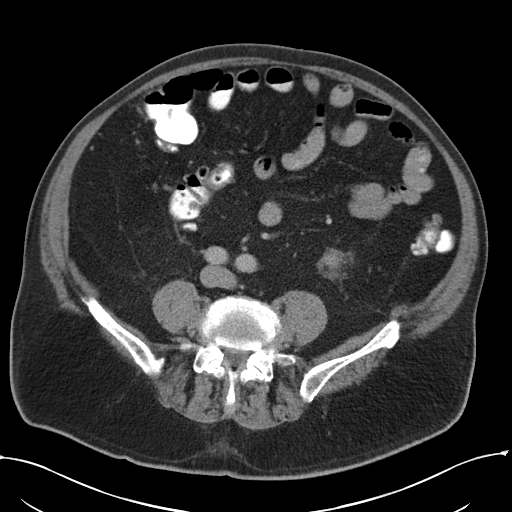
[im 52/104  soft-tissue]
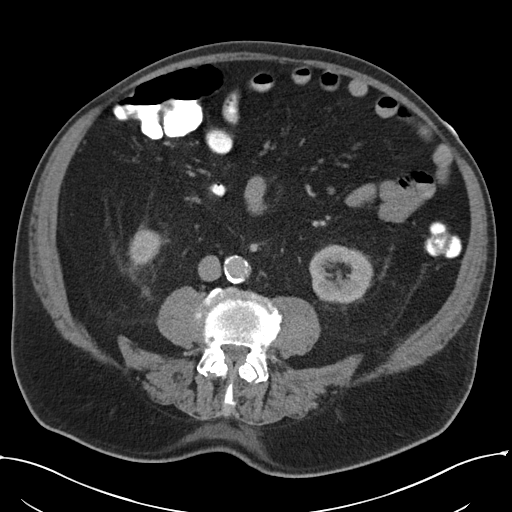
[im 58/104  soft-tissue]
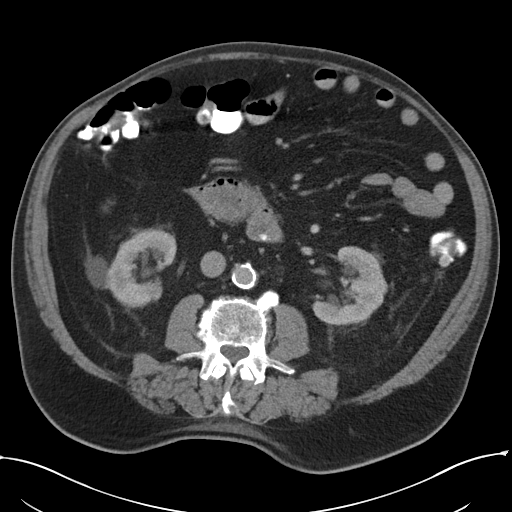
[im 69/104  soft-tissue]
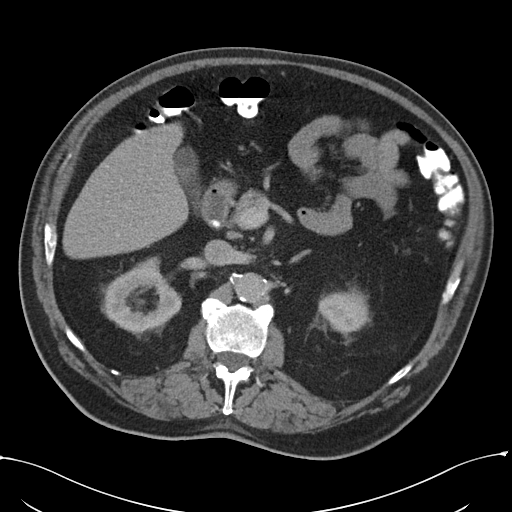
[im 69/104  bone]
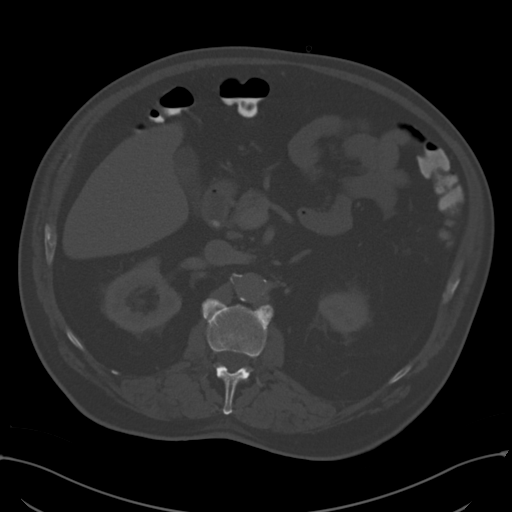
[im 75/104  soft-tissue]
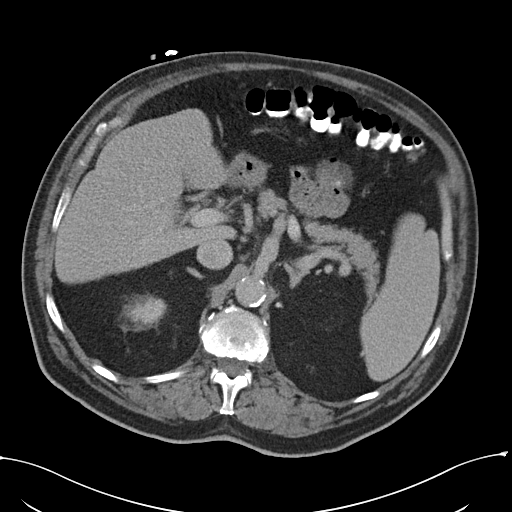
[im 81/104  soft-tissue]
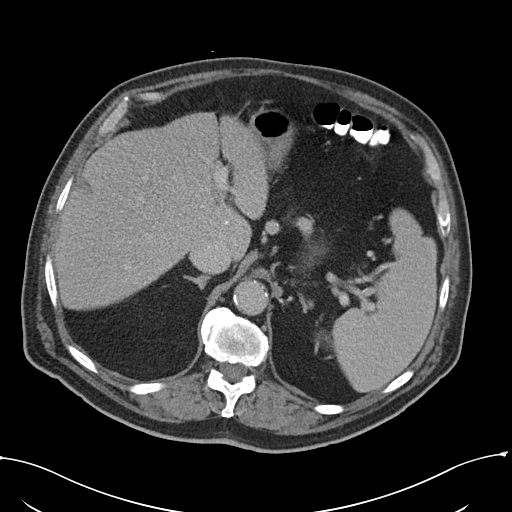
[im 92/104  soft-tissue]
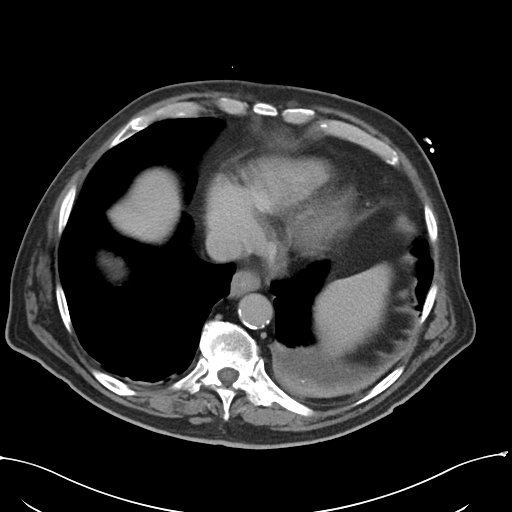
[im 98/104  soft-tissue]
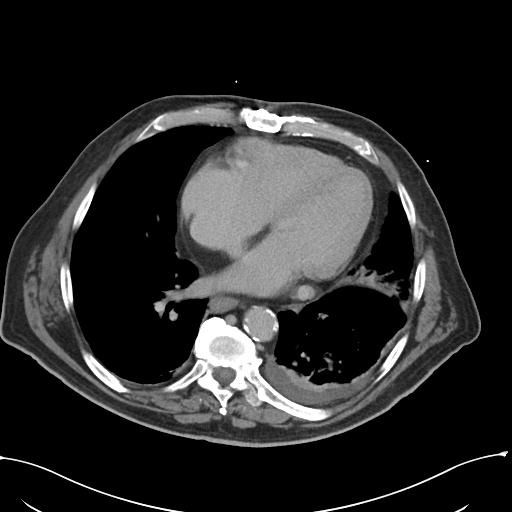

[Series 5: coronals · coronal · 0.88mm/px · 3 of 181 slices shown]
[im 61/181  soft-tissue]
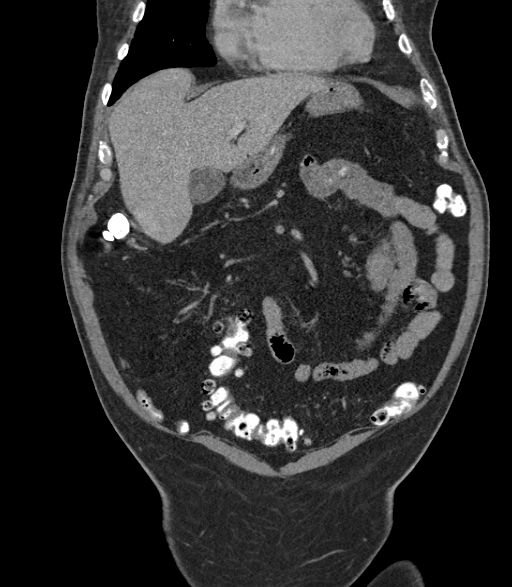
[im 81/181  soft-tissue]
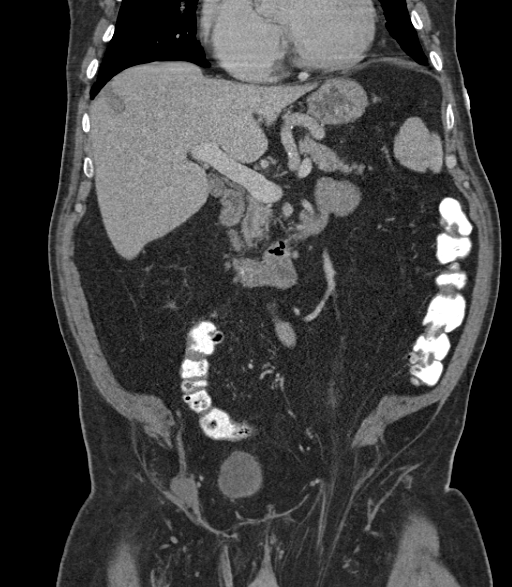
[im 101/181  soft-tissue]
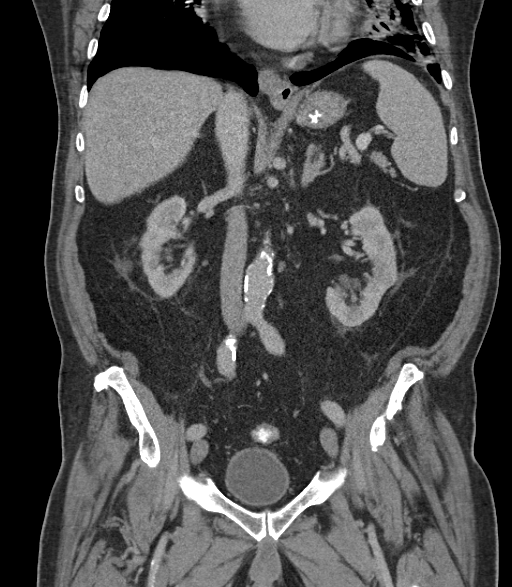

[16 of 46 positions shown; findings below may reference images not displayed]

FINDINGS: The patient has small bilateral pleural effusions, larger on the
left. Dense airspace disease is partially visualized in the lingula
with air bronchograms present. Mild degree of dependent airspace
disease is seen in the right lung base. Mild atelectasis on the
right is noted. Heart size is mildly enlarged. No pericardial
effusion.

No liver mass is identified. Finding on ultrasound may be due to a
prominent leaflet of the diaphragm which mildly indents the liver.
The spleen, adrenal glands and pancreas are unremarkable. Small
exophytic cyst off the lower pole of the right kidney measures
cm in diameter. Parapelvic cysts on the left are noted. Gallstones
without a evidence of cholecystitis are noted.

The patient has fat containing inguinal hernias bilaterally, larger
on the left. The prostate gland is mildly prominent. Urinary bladder
is unremarkable. Diverticulosis without diverticulitis is noted.
Small hiatal hernia is seen. Small bowel is unremarkable. Aortoiliac
atherosclerosis without aneurysm is noted. No lymphadenopathy or
fluid. No lytic or sclerotic lesion is identified.
IMPRESSION: Negative for liver mass. Finding on ultrasound may be related to a
prominent leaflet of the diaphragm. There is no acute abnormality
abdomen or pelvis and no evidence of neoplasm.

Findings consistent with lingular and likely left lower lobe
pneumonia.

Diverticulosis without diverticulitis.

Aortoiliac atherosclerosis.

Gallstones without evidence of cholecystitis.

Mild prostatomegaly.

Fat containing inguinal hernias, larger on the left.

Small hiatal hernia.

## 2016-06-30 IMAGING — DX DG PELVIS 1-2V
2 series · 2 of 2 positions shown · non-contrast
Comparison: None.

CLINICAL DATA: Scrotal pt.  Right SI pain.

EXAM:
PELVIS - 1-2 VIEW

[pelvis ap (1 of 2)]
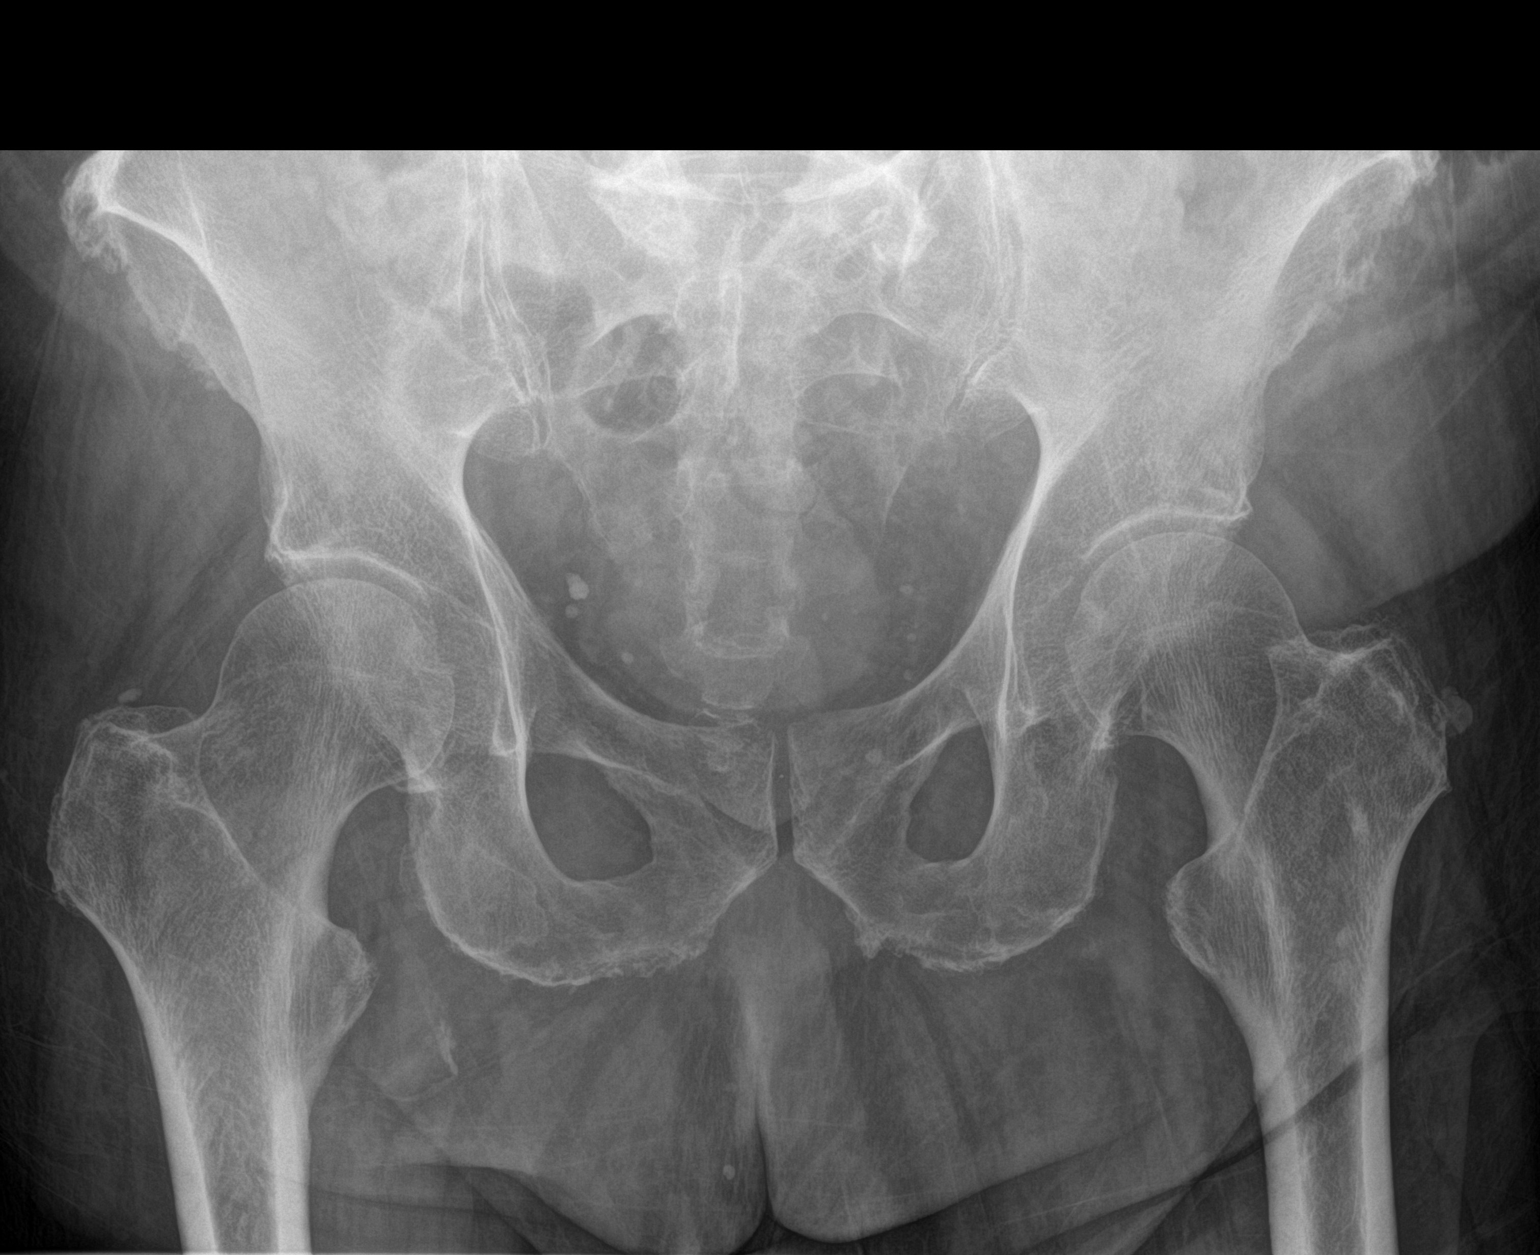

[pelvis ap (2 of 2)]
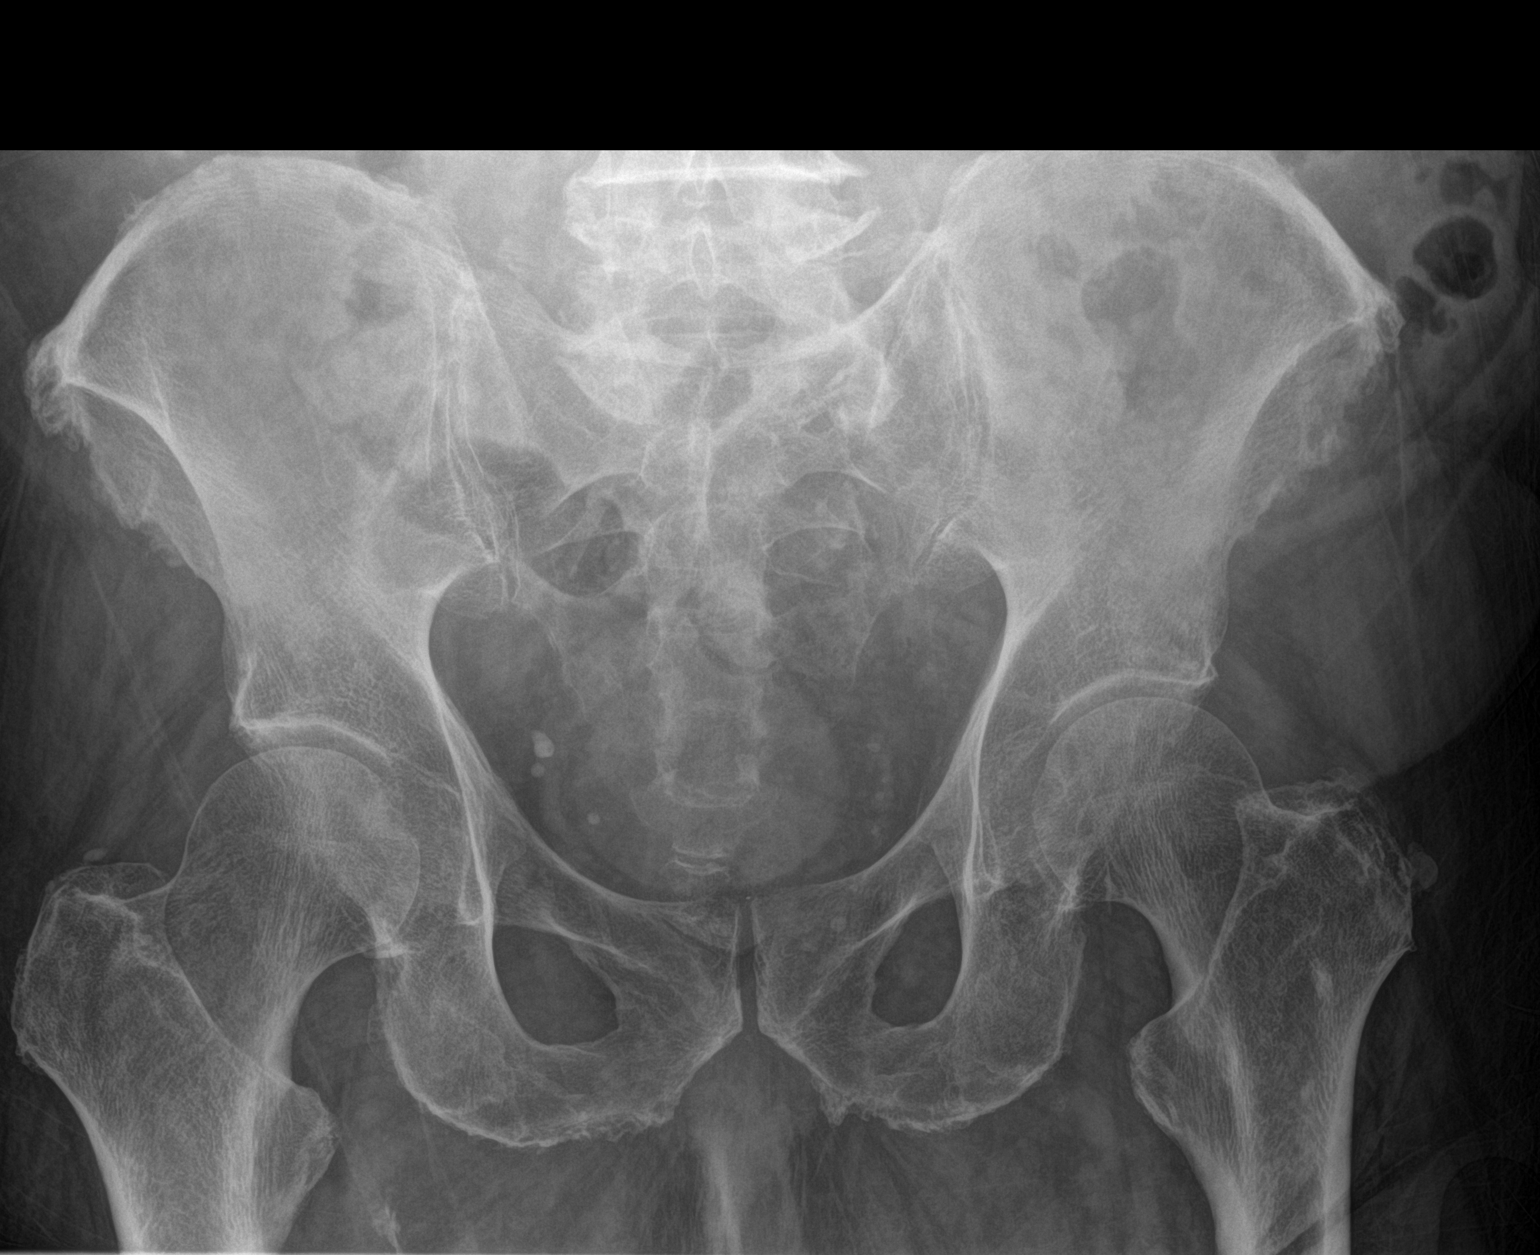

[2 of 2 positions shown; findings below may reference images not displayed]

FINDINGS: There is no evidence of pelvic fracture or diastasis. No pelvic bone
lesions are seen. No significant degenerative changes are present in
the SI joints. Bone mineralization is within normal limits.
IMPRESSION: Negative one view pelvis radiograph.

## 2016-07-01 IMAGING — DX DG CHEST 2V
2 series · 2 of 2 positions shown · non-contrast
Comparison: 08/27/2014

CLINICAL DATA: Shortness of Breath

EXAM:
CHEST  2 VIEW

[chest lat]
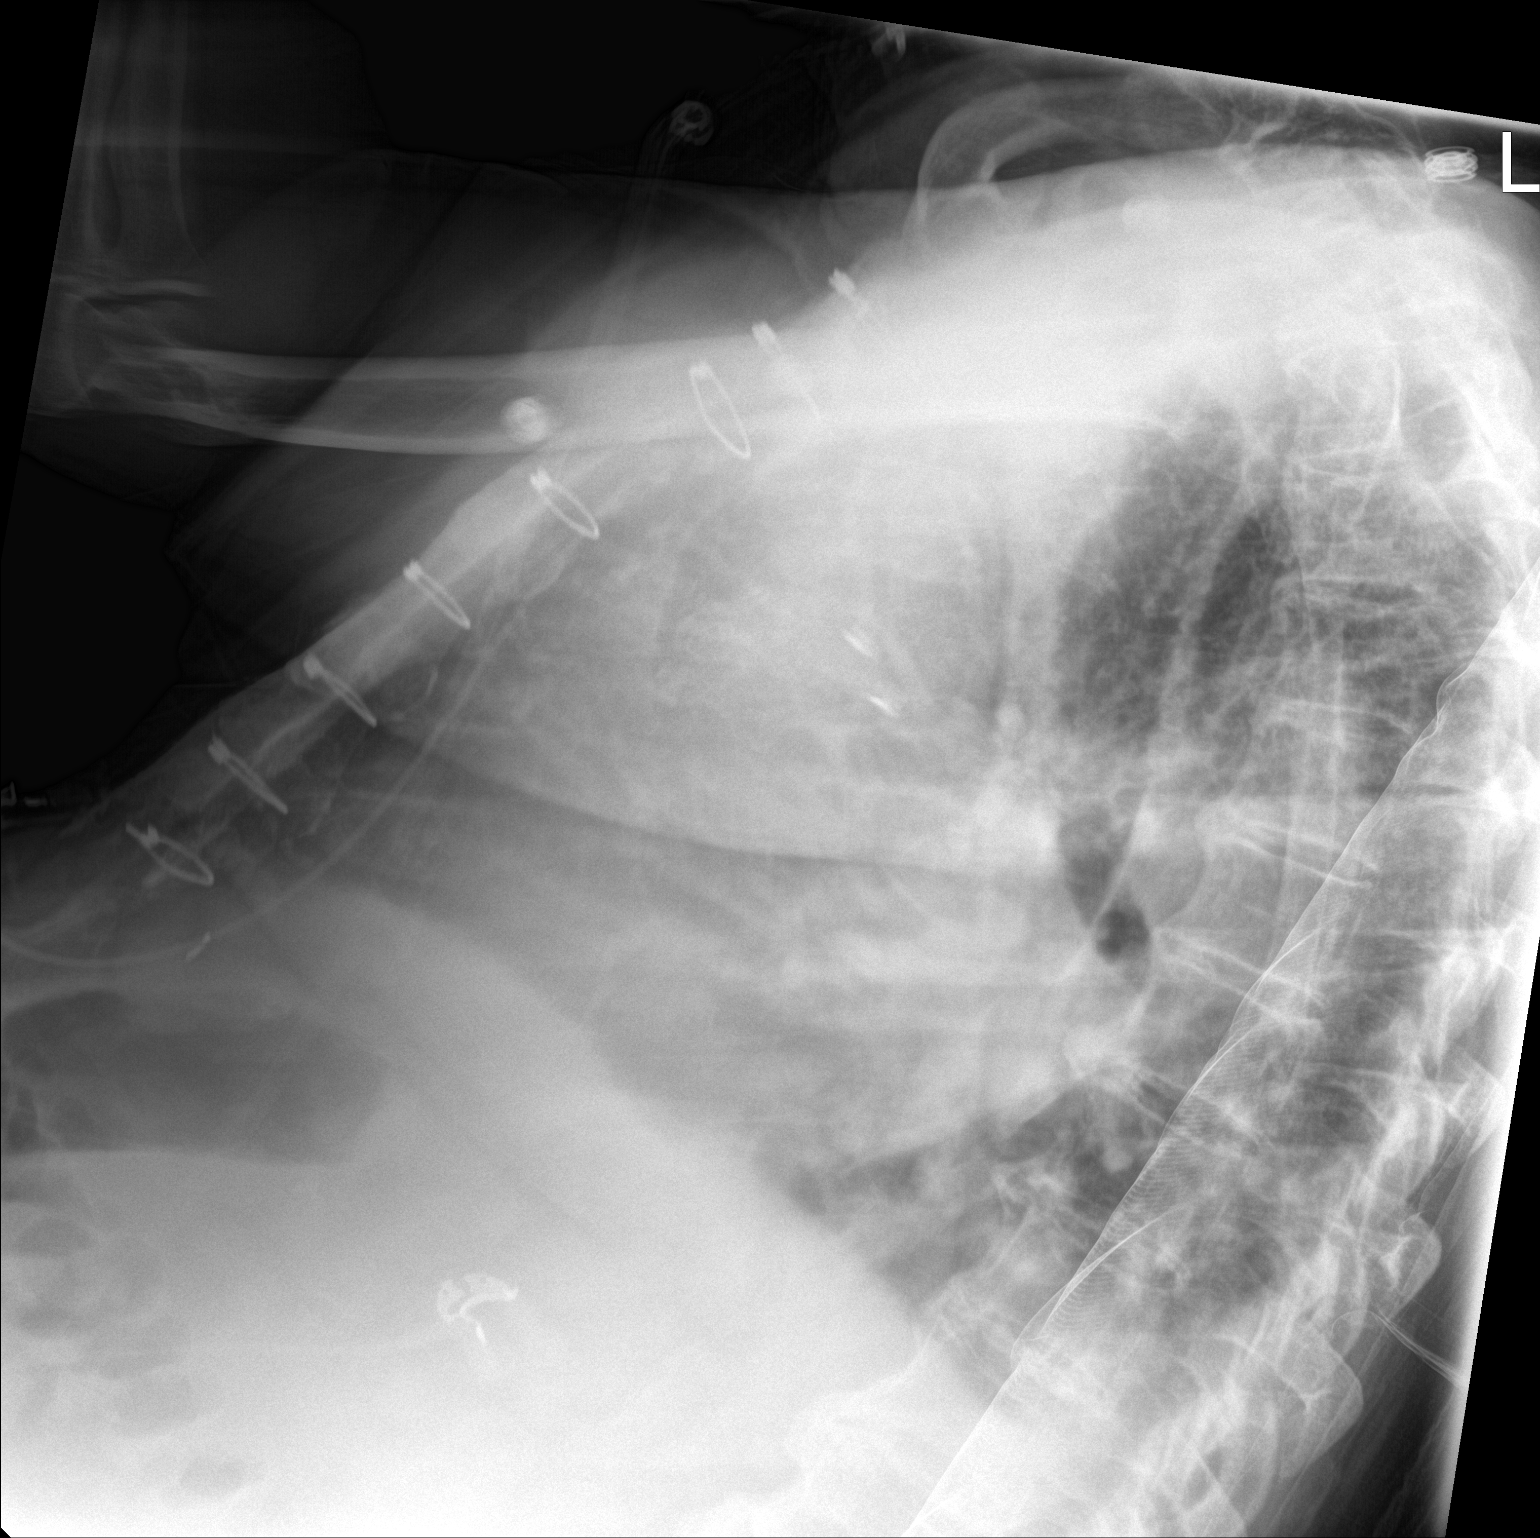

[chest ap]
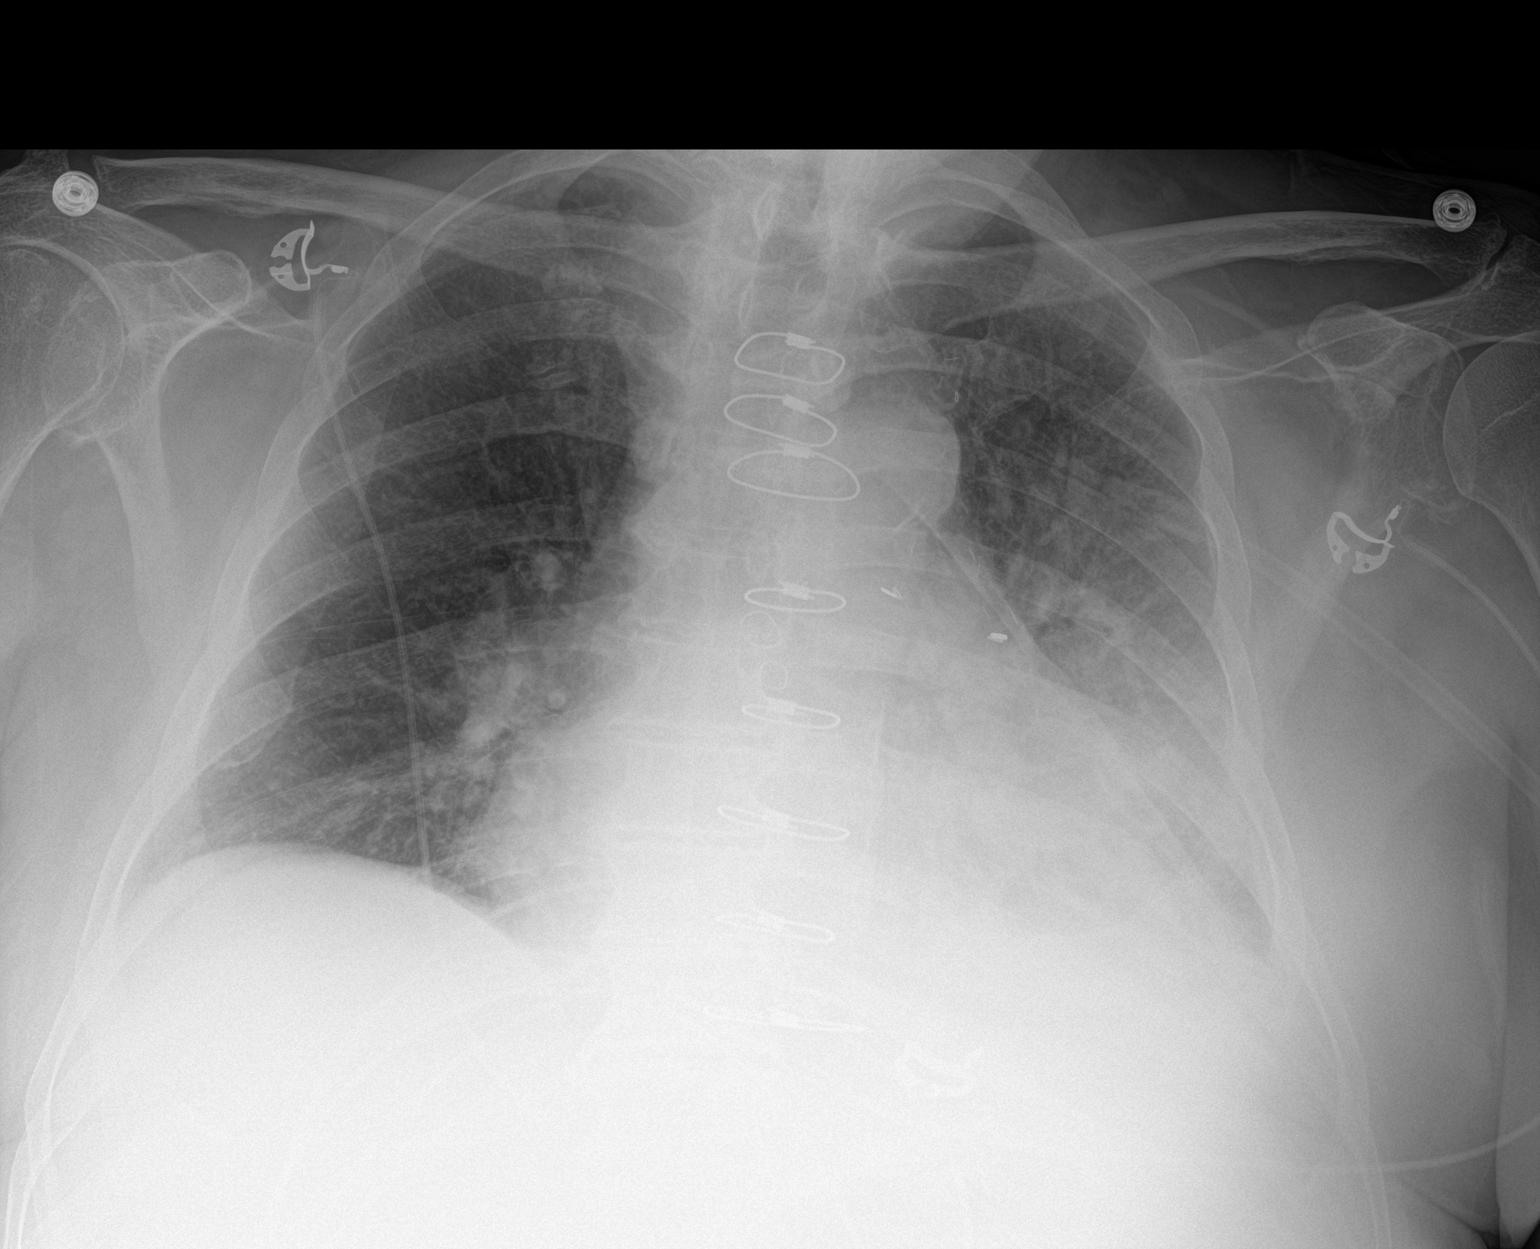

[2 of 2 positions shown; findings below may reference images not displayed]

FINDINGS: Prior CABG. Cardiomegaly. Focal opacity in the left lower lobe,
slightly progressed since prior study concerning for pneumonia.
Right lung is clear. No effusions scratch head no effusions. No
acute bony abnormality.
IMPRESSION: Cardiomegaly. Progressive left lower lobe airspace opacity
concerning for worsening pneumonia.

## 2016-07-24 NOTE — Addendum Note (Signed)
Addendum  created 07/24/16 0947 by Naaman Curro, MD   Sign clinical note    

## 2016-07-26 ENCOUNTER — Ambulatory Visit: Payer: Medicare Other | Admitting: Neurology

## 2016-09-03 NOTE — Anesthesia Postprocedure Evaluation (Signed)
Anesthesia Post Note  Patient: Vincent Sharp  Procedure(s) Performed: Procedure(s) (LRB): CARDIOVERSION (N/A)     Anesthesia Post Evaluation  Last Vitals:  Vitals:   05/17/16 1340 05/17/16 1350  BP: 96/60 (!) 98/52  Pulse: (!) 44 (!) 48  Resp: 14 18  Temp:      Last Pain:  Vitals:   05/17/16 1104  TempSrc: Oral                 Delores Edelstein EDWARD

## 2016-09-03 NOTE — Addendum Note (Signed)
Addendum  created 09/03/16 1357 by Lyndle Herrlich, MD   Sign clinical note

## 2016-09-28 ENCOUNTER — Ambulatory Visit: Payer: Self-pay | Admitting: General Surgery

## 2016-09-28 NOTE — H&P (Signed)
Vincent Sharp 09/28/2016 10:30 AM Location: [facility] Patient #: [id number] DOB: 25-Dec-1932 Married / Language: English / Race: Refused to Report/Unreported Male  History of Present Illness Vincent Hollingshead MD; 09/28/2016 11:28 AM) The patient is a 83 year old male.   Note:He is referred by Dr. Diona Fanti for consultation regarding a large left inguinal hernia. He states he's had swelling in the groin for about 10 or 11 years. It has extended down into his left hemiscrotum. He has a history of bilateral hydrocele repair as well as a right inguinal hernia repair in 2005. He developed a left scrotum infection after that that had to be drained. This hernia is now symptomatic and causing him intermittent pain. He does have intermittent difficulty with urination and takes medication for this. He denies constipation. He has chronic atrial fibrillation and is status post cardioversion. He takes chronic Eliquis. His cardiologist is Dr. Wynonia Lawman. He is here with his wife and his daughter. He is interested and hernia repair.  Past Surgical History ([name], [place]; 09/28/2016 10:31 AM) Cataract Surgery Bilateral. Colon Polyp Removal - Colonoscopy Coronary Artery Bypass Graft Hemorrhoidectomy Knee Surgery Right. Shoulder Surgery Right. Tonsillectomy  Diagnostic Studies History ([name], [place]; 09/28/2016 10:31 AM) Colonoscopy 1-5 years ago  Allergies ([name], [place]; 09/28/2016 10:32 AM) No Known Drug Allergies 09/28/2016 Allergies Reconciled  Medication History ([name], [place]; 09/28/2016 10:32 AM) Amiodarone HCl (200MG  Tablet, Oral) Active. Bystolic (10MG  Tablet, Oral) Active. Eliquis (5MG  Tablet, Oral) Active. Furosemide (40MG  Tablet, Oral) Active. Atorvastatin Calcium (40MG  Tablet, Oral) Active. Medications Reconciled  Social History ([name], [place]; 09/28/2016 10:31 AM) Caffeine use Carbonated beverages, Coffee, Tea. No  alcohol use No drug use Tobacco use Never smoker.  Family History ([name], [place]; 09/28/2016 10:31 AM) Breast Cancer Sister. Cancer Sister. Heart Disease Mother. Malignant Neoplasm Of Pancreas Father. Prostate Cancer Brother.  Other Problems ([name], [place]; 09/28/2016 10:31 AM) Arthritis Atrial Fibrillation Congestive Heart Failure Depression Hemorrhoids High blood pressure Hypercholesterolemia Sleep Apnea     Review of Systems Vincent Hollingshead MD; 09/28/2016 11:42 AM) General Not Present- Appetite Loss, Chills, Fatigue, Fever, Night Sweats, Weight Gain and Weight Loss. Skin Present- Dryness. Not Present- Change in Wart/Mole, Hives, Jaundice, New Lesions, Non-Healing Wounds, Rash and Ulcer. HEENT Present- Wears glasses/contact lenses. Not Present- Earache, Hearing Loss, Hoarseness, Nose Bleed, Oral Ulcers, Ringing in the Ears, Seasonal Allergies, Sinus Pain, Sore Throat, Visual Disturbances and Yellow Eyes. Respiratory Present- Snoring. Not Present- Bloody sputum, Chronic Cough, Difficulty Breathing and Wheezing. Breast Not Present- Breast Mass, Breast Pain, Nipple Discharge and Skin Changes. Cardiovascular Present- Shortness of Breath. Not Present- Chest Pain, Difficulty Breathing Lying Down, Leg Cramps, Palpitations, Rapid Heart Rate and Swelling of Extremities. Gastrointestinal Not Present- Abdominal Pain, Bloating, Bloody Stool, Change in Bowel Habits, Chronic diarrhea, Constipation, Difficulty Swallowing, Excessive gas, Gets full quickly at meals, Hemorrhoids, Indigestion, Nausea, Rectal Pain and Vomiting. Male Genitourinary Present- Frequency, Impotence and Urgency. Not Present- Blood in Urine, Change in Urinary Stream, Nocturia, Painful Urination and Urine Leakage.  Note: Has memory problems and limited mobility.   Vitals (Tanisha A. Brown RMA; 09/28/2016 10:32 AM) 09/28/2016 10:31 AM Weight: 241 lb Height: 72in Body Surface Area: 2.31 m  Body Mass Index: 32.69 kg/m  Temp.: 98.68F  Pulse: 57 (Regular)  P.OX: 98% (Room air) BP: 112/72 (Sitting, Left Arm, Standard)      Physical Exam Vincent Hollingshead MD; 09/28/2016 11:46 AM)  The physical  exam findings are as follows: Note:GENERAL APPEARANCE: Obese, elderly male in NAD. Pleasant and cooperative.  EARS, NOSE, MOUTH THROAT: [place]/AT external ears: no lesions or deformities external nose: no lesions or deformities hearing: grossly normal lips: moist, no deformities EYES external: conjunctiva, lids, sclerae normal pupils: equal, round   CV ascultation: RRR, no murmur extremity edema: yes, R > L extremity varicosities: Yes, bilateral venous stasis changes  RESP/CHEST auscultation: breath sounds equal and clear respiratory effort: normal  GASTROINTESTINAL abdomen: Soft, non-tender, non-distended, no masses liver and spleen: not enlarged. hernia: large left inguinal hernia extending into distal left hemiscrotum scar: right groin scar  GENITOURINARY scrotum: no masses penis: no lesions  MUSCULOSKELETAL station and gait: a little unsteady digits/nails: no clubbing or cyanosis instability: slight   SKIN jaundice: none   NEUROLOGIC speech: slow, appropriate  PSYCHIATRIC alertness and orientation: normal mood/affect/behavior: normal judgement and insight: normal    Assessment & Plan Vincent Hollingshead MD; 09/28/2016 11:31 AM)  Nira Conn LEFT INGUINAL HERNIA (K40.30) Impression: This is chronic. Hernia extends down into his distal scrotum. No obstruction symptoms.  Plan: Open repair of chronically incarcerated left inguinal hernia with mesh. Stop Eliquis 3 days prior to surgery. I have explained the procedure, risks, and aftercare of inguinal hernia repair. Risks include but are not limited to bleeding, infection, wound problems, anesthesia, recurrent atrial fibrillation, recurrence, bladder or intestine injury, urinary retention, testicular  dysfunction, chronic pain, mesh problems. He seems to understand and agrees with the plan. I recommended he ran to a lift chair postoperatively.  Jackolyn Confer, M.D.

## 2016-10-05 ENCOUNTER — Encounter (HOSPITAL_COMMUNITY)
Admission: RE | Admit: 2016-10-05 | Discharge: 2016-10-05 | Disposition: A | Discharge status: Home / Self Care | Payer: Medicare Other | Source: Ambulatory Visit | Attending: General Surgery | Admitting: General Surgery

## 2016-10-05 ENCOUNTER — Encounter (HOSPITAL_COMMUNITY): Payer: Self-pay

## 2016-10-05 DIAGNOSIS — G4730 Sleep apnea, unspecified: Secondary | ICD-10-CM | POA: Diagnosis not present

## 2016-10-05 DIAGNOSIS — Z800 Family history of malignant neoplasm of digestive organs: Secondary | ICD-10-CM | POA: Diagnosis not present

## 2016-10-05 DIAGNOSIS — E669 Obesity, unspecified: Secondary | ICD-10-CM | POA: Insufficient documentation

## 2016-10-05 DIAGNOSIS — F329 Major depressive disorder, single episode, unspecified: Secondary | ICD-10-CM | POA: Diagnosis not present

## 2016-10-05 DIAGNOSIS — G4733 Obstructive sleep apnea (adult) (pediatric): Secondary | ICD-10-CM | POA: Insufficient documentation

## 2016-10-05 DIAGNOSIS — Z85828 Personal history of other malignant neoplasm of skin: Secondary | ICD-10-CM | POA: Insufficient documentation

## 2016-10-05 DIAGNOSIS — I4891 Unspecified atrial fibrillation: Secondary | ICD-10-CM

## 2016-10-05 DIAGNOSIS — Z9889 Other specified postprocedural states: Secondary | ICD-10-CM

## 2016-10-05 DIAGNOSIS — Z85038 Personal history of other malignant neoplasm of large intestine: Secondary | ICD-10-CM | POA: Insufficient documentation

## 2016-10-05 DIAGNOSIS — Z951 Presence of aortocoronary bypass graft: Secondary | ICD-10-CM

## 2016-10-05 DIAGNOSIS — Z01812 Encounter for preprocedural laboratory examination: Secondary | ICD-10-CM

## 2016-10-05 DIAGNOSIS — I2510 Atherosclerotic heart disease of native coronary artery without angina pectoris: Secondary | ICD-10-CM

## 2016-10-05 DIAGNOSIS — K4090 Unilateral inguinal hernia, without obstruction or gangrene, not specified as recurrent: Secondary | ICD-10-CM | POA: Diagnosis present

## 2016-10-05 DIAGNOSIS — Z9841 Cataract extraction status, right eye: Secondary | ICD-10-CM | POA: Diagnosis not present

## 2016-10-05 DIAGNOSIS — I509 Heart failure, unspecified: Secondary | ICD-10-CM | POA: Insufficient documentation

## 2016-10-05 DIAGNOSIS — D649 Anemia, unspecified: Secondary | ICD-10-CM

## 2016-10-05 DIAGNOSIS — Z809 Family history of malignant neoplasm, unspecified: Secondary | ICD-10-CM | POA: Diagnosis not present

## 2016-10-05 DIAGNOSIS — Z9842 Cataract extraction status, left eye: Secondary | ICD-10-CM | POA: Diagnosis not present

## 2016-10-05 DIAGNOSIS — M1990 Unspecified osteoarthritis, unspecified site: Secondary | ICD-10-CM | POA: Diagnosis not present

## 2016-10-05 DIAGNOSIS — I110 Hypertensive heart disease with heart failure: Secondary | ICD-10-CM

## 2016-10-05 DIAGNOSIS — Z803 Family history of malignant neoplasm of breast: Secondary | ICD-10-CM | POA: Diagnosis not present

## 2016-10-05 DIAGNOSIS — Z8249 Family history of ischemic heart disease and other diseases of the circulatory system: Secondary | ICD-10-CM | POA: Diagnosis not present

## 2016-10-05 DIAGNOSIS — Z8042 Family history of malignant neoplasm of prostate: Secondary | ICD-10-CM | POA: Diagnosis not present

## 2016-10-05 DIAGNOSIS — R000 Tachycardia, unspecified: Secondary | ICD-10-CM | POA: Diagnosis not present

## 2016-10-05 DIAGNOSIS — E7800 Pure hypercholesterolemia, unspecified: Secondary | ICD-10-CM | POA: Diagnosis not present

## 2016-10-05 DIAGNOSIS — E785 Hyperlipidemia, unspecified: Secondary | ICD-10-CM

## 2016-10-05 DIAGNOSIS — Z79899 Other long term (current) drug therapy: Secondary | ICD-10-CM | POA: Diagnosis not present

## 2016-10-05 HISTORY — DX: Unspecified osteoarthritis, unspecified site: M19.90

## 2016-10-05 HISTORY — DX: Headache, unspecified: R51.9

## 2016-10-05 HISTORY — DX: Paroxysmal atrial fibrillation: I48.0

## 2016-10-05 HISTORY — DX: Headache: R51

## 2016-10-05 HISTORY — DX: Anxiety disorder, unspecified: F41.9

## 2016-10-05 HISTORY — DX: Unspecified amblyopia, left eye: H53.002

## 2016-10-05 LAB — CBC WITH DIFFERENTIAL/PLATELET
BASOS ABS: 0 10*3/uL (ref 0.0–0.1)
BASOS PCT: 1 %
EOS ABS: 0 10*3/uL (ref 0.0–0.7)
Eosinophils Relative: 1 %
HEMATOCRIT: 36.3 % — AB (ref 39.0–52.0)
Hemoglobin: 13.2 g/dL (ref 13.0–17.0)
Lymphocytes Relative: 24 %
Lymphs Abs: 1 10*3/uL (ref 0.7–4.0)
MCH: 32.8 pg (ref 26.0–34.0)
MCHC: 36.4 g/dL — AB (ref 30.0–36.0)
MCV: 90.3 fL (ref 78.0–100.0)
MONO ABS: 0.4 10*3/uL (ref 0.1–1.0)
MONOS PCT: 9 %
NEUTROS ABS: 2.8 10*3/uL (ref 1.7–7.7)
NEUTROS PCT: 65 %
PLATELETS: 71 10*3/uL — AB (ref 150–400)
RBC: 4.02 MIL/uL — ABNORMAL LOW (ref 4.22–5.81)
RDW: 12.9 % (ref 11.5–15.5)
WBC: 4.2 10*3/uL (ref 4.0–10.5)

## 2016-10-05 LAB — COMPREHENSIVE METABOLIC PANEL
ALBUMIN: 3.8 g/dL (ref 3.5–5.0)
ALT: 22 U/L (ref 17–63)
ANION GAP: 8 (ref 5–15)
AST: 31 U/L (ref 15–41)
Alkaline Phosphatase: 70 U/L (ref 38–126)
BUN: 23 mg/dL — ABNORMAL HIGH (ref 6–20)
CALCIUM: 8.9 mg/dL (ref 8.9–10.3)
CO2: 26 mmol/L (ref 22–32)
CREATININE: 1.33 mg/dL — AB (ref 0.61–1.24)
Chloride: 105 mmol/L (ref 101–111)
GFR calc Af Amer: 56 mL/min — ABNORMAL LOW (ref >60)
GFR calc non Af Amer: 48 mL/min — ABNORMAL LOW (ref >60)
Glucose, Bld: 98 mg/dL (ref 65–99)
POTASSIUM: 3.9 mmol/L (ref 3.5–5.1)
SODIUM: 139 mmol/L (ref 135–145)
Total Bilirubin: 1.8 mg/dL — ABNORMAL HIGH (ref 0.3–1.2)
Total Protein: 6.3 g/dL — ABNORMAL LOW (ref 6.5–8.1)

## 2016-10-05 LAB — PROTIME-INR
INR: 1.25
PROTHROMBIN TIME: 15.7 s — AB (ref 11.4–15.2)

## 2016-10-05 NOTE — Progress Notes (Addendum)
PCP is Dr. Gust Rung 08/2016 Neurologist is Dr. Maureen Chatters  LOV 03/2016 Cardio is Dr. Thurman Coyer LOV 07/2016    Pt currently denies any cardiac issue.  Did have Echo done 03/2016 Cabg done 26 yrs ago by Dr. Dallie Piles. Did have heart cath "yrs ago"-- had pain mowing the grass, medical treatment (after cath) was advised and was told about having collateral circulation. Past Feb, he was in for CHF, which resolved in 4-5 days.  Also was in A-Fib, cardioverted, lasted a day, and had to come back as outpatient for another cardioversion, which the wife states, it had help and he was in rhythym. Dr. Zella Richer has instructed patient to stop his Eliquis on the 13th. (done) Has lazy eye in the left, sees much better out of the right. Has not used his CPAP since 04/2016--pcp is aware. I called Dr. Thurman Coyer office for cardiac clearance note.

## 2016-10-05 NOTE — Pre-Procedure Instructions (Signed)
Vincent Sharp  10/05/2016      [id number] [address], [place] [address] Phone: [phone] Fax: [phone]    Your procedure is scheduled on  August 17th, Friday   Report to [facility] Admitting at 7:30 AM             (posted surgery time 9:30a-11:00a)   Call this number if you have problems the morning of surgery:  [phone], for other questions, call [phone] Mon-Fri from 8a-4p   Remember:   Do not eat food or drink liquids after midnight Thursday.              4-5 days prior to surgery, STOP TAKING any vitamins, herbal supplements, anti-inflammatories.   Take these medicines the morning of surgery with A SIP OF WATER : Amiodarone, Nebivolol.              Eliquis will be stopped ______________________________   Do not wear jewelry - no rings or watches  Do not wear lotions, colognes or deoderant.             Men may shave face and neck.   Do not bring valuables to the hospital.  [facility] is not responsible for any belongings or valuables.  Contacts, dentures or bridgework may not be worn into surgery.  Leave your suitcase in the car.  After surgery it may be brought to your room.  For patients admitted to the hospital, discharge time will be determined by your treatment team.  Patients discharged the day of surgery will not be allowed to drive home, AND you will need someone to stay with you for the first 24 hrs.  Please read over the following fact sheets that you were given. Pain Booklet and Surgical Site Infection Prevention         - Preparing For Surgery  Before surgery, you can play an important role. Because skin is not sterile, your skin needs to be as free of germs as possible. You can reduce the number of germs on your skin by washing with CHG (chlorahexidine gluconate) Soap before surgery.  CHG is an antiseptic  cleaner which kills germs and bonds with the skin to continue killing germs even after washing.  Please do not use if you have an allergy to CHG or antibacterial soaps. If your skin becomes reddened/irritated stop using the CHG.  Do not shave (including legs and underarms) for at least 48 hours prior to first CHG shower. It is OK to shave your face.  Please follow these instructions carefully.   1. Shower the NIGHT BEFORE SURGERY and the MORNING OF SURGERY with CHG.   2. If you chose to wash your hair, wash your hair first as usual with your normal shampoo.  3. After you shampoo, rinse your hair and body thoroughly to remove the shampoo.  4. Use CHG as you would any other liquid soap. You can apply CHG directly to the skin and wash gently with a scrungie or a clean washcloth.   5. Apply the CHG Soap to your body ONLY FROM THE NECK DOWN.  Do not use on open wounds or open sores. Avoid contact with your eyes, ears, mouth and genitals (private parts). Wash genitals (private parts) with your normal soap.  6. Wash thoroughly, paying special attention to the area where your surgery will be performed.  7. Thoroughly rinse your body with warm water from the neck down.  8. DO NOT shower/wash with your normal soap after using and rinsing off the CHG Soap.  9. Pat yourself dry with a CLEAN TOWEL.   10. Wear CLEAN PAJAMAS   11. Place CLEAN SHEETS on your bed the night of your first shower and DO NOT SLEEP WITH PETS.    Day of Surgery: Do not apply any deodorants/lotions. Please wear clean clothes to the hospital/surgery center.

## 2016-10-06 ENCOUNTER — Encounter (HOSPITAL_COMMUNITY): Payer: Self-pay

## 2016-10-06 NOTE — Progress Notes (Addendum)
Anesthesia Chart Review:  Patient is a 83 year old male scheduled for left inguinal hernia repair on 10/08/16 by Dr. Zella Richer.  History includes never smoker, HTN, HLD, CAD s/p CABG (LIMA-LAD, SVG-OM1, SVG-DIAG1) '92 with occluded SVG-DIAG '09 (vessel not suitable for PCI), afib/PAF s/p DCCV 04/13/16 and 05/17/16, CHF, OSA/obesity hypoventilation syndrome (no CPAP since 3/201878), anemia, bruises easily, skin cancer, colon cancer s/p partial colectomy, right inguinal hernia repair '05, right rotator cuff repair '15, left "lazy eye." Although labs indicate he has chronic thrombocytopenia (since at least 2012), I don't see it listed in his health history, Dr. Thurman Coyer records, or 2015 note from Dr. Reynaldo Minium (scanned under Media tab). BMI is consistent with obesity.  - PCP is Dr. Burnard Bunting.   - Cardiologist is Dr. Ezzard Standing, last visit 09/07/16. He has cleared patient for surgery from a cardiac standpoint with permission to hold Eliquis two days prior to surgery.  - Neurologist is Dr. Asencion Partridge Dohmeier.   Meds include alfuzosin, amiodarone, Eliquis (last dose 10/04/16), Lipitor, Lasix, MVI, Bystolic, fish oil.   BP (!) 98/50 Comment: taken manually  Pulse 82   Temp 36.8 C   Resp 20   Ht 6' (1.829 m)   Wt 239 lb 11.2 oz (108.7 kg)   SpO2 100%   BMI 32.51 kg/m   EKG 05/17/16: SR with first degree AV block, right BBB with repolarization abnormality, LAD. Afib resolved since last tracing.   Echo 04/12/16: Study Conclusions - Left ventricle: The cavity size was normal. Wall thickness was   increased in a pattern of severe LVH. Systolic function was   normal. The estimated ejection fraction was in the range of 55%   to 60%. Wall motion was normal; there were no regional wall   motion abnormalities. - Aortic valve: Cusp separation was mildly reduced. - Left atrium: The atrium was severely dilated. - Right ventricle: The cavity size was moderately dilated. Wall   thickness was normal. -  Right atrium: The atrium was severely dilated. - Tricuspid valve: There was mild-moderate regurgitation. - Pulmonary arteries: Systolic pressure was mildly to moderately   increased. PA peak pressure: 39 mm Hg (S).  Cardiac cath 01/11/08:  1. Probable recent occlusion of the saphenous vein graft to the diagonal branch, which is unprotected at the present time and a likely source of ischemia. 2. Patent internal mammary graft to the left anterior descending coronary artery and a patent saphenous vein graft to the obtuse marginal. 3. Severe native two-vessel coronary artery disease with mild to moderate disease involving the right coronary artery. 4. Normal left ventricular function. RECOMMENDATIONS: Intensive medical therapy and attention to riskfactors. Continued medical therapy. The vessel in question is notsuitable for percutaneous intervention (Dr. Wynonia Lawman).  Carotid duplex 09/01/12: < 40% bilateral ICA stenosis, antegrade VA flow.    CXR 04/11/16: IMPRESSION: Cardiomegaly with pulmonary vascular congestion. No edema or consolidation. There is aortic atherosclerosis.  Preoperative labs noted. Cr 1.33, stable since 03/2016. Total bilirubin 1.8, previously 1.5 on 04/11/16. H/H 13.2/36.3. PLT 71K--platelet count 69-145K since 08/2010 and mostly ~ 80K range. He is for PT/PTT on the day of surgery. I'll also order T&S so we will have a blood specimen available (for thrombocytopenia).   I reviewed thrombocytopenia, current labs with anesthesiologist Dr. Lissa Hoard. I also called labs to [place] at Dr. Jacquiline Doe office for on-going follow-up purposes and to [place] at Dr. Bertrum Sol office. Since thrombocytopenia appears chronic then would anticipate that he can proceed as planned if Dr. Zella Richer  feels labs are acceptable for surgery and otherwise no acute changes. (Update 10/07/16 1:10 PM: Jacob Moores from Dr. Jacquiline Doe office called yesterday after reviewing PLT count with him. He has been following  patient's thrombocytopenia and feels it has been mild and stable. He does not think this will interfere with plans for hernia repair. Patient's wife returned my call and reported patient had never been evaluated by a specialist for thrombocytopenia and was not aware of a specific etiology.)  George Hugh [facility]Anesthesiology Phone [phone] 10/06/2016 4:03 PM

## 2016-10-07 MED ORDER — CEFAZOLIN SODIUM-DEXTROSE 2-4 GM/100ML-% IV SOLN
2.0000 g | INTRAVENOUS | Status: AC
  Administered 2016-10-08: 2 g via INTRAVENOUS
  Filled 2016-10-07: qty 100

## 2016-10-08 ENCOUNTER — Ambulatory Visit (HOSPITAL_COMMUNITY)
Admission: RE | Admit: 2016-10-08 | Discharge: 2016-10-08 | Disposition: A | Discharge status: Home / Self Care | Payer: Medicare Other | Source: Ambulatory Visit | Attending: General Surgery | Admitting: General Surgery

## 2016-10-08 ENCOUNTER — Encounter (HOSPITAL_COMMUNITY)
Admission: RE | Disposition: A | Discharge status: Home / Self Care | Payer: Self-pay | Source: Ambulatory Visit | Attending: General Surgery

## 2016-10-08 ENCOUNTER — Ambulatory Visit (HOSPITAL_COMMUNITY): Payer: Medicare Other | Admitting: Emergency Medicine

## 2016-10-08 ENCOUNTER — Ambulatory Visit (HOSPITAL_COMMUNITY): Payer: Medicare Other | Admitting: Vascular Surgery

## 2016-10-08 ENCOUNTER — Encounter (HOSPITAL_COMMUNITY): Payer: Self-pay | Admitting: *Deleted

## 2016-10-08 DIAGNOSIS — Z951 Presence of aortocoronary bypass graft: Secondary | ICD-10-CM | POA: Insufficient documentation

## 2016-10-08 DIAGNOSIS — Z9842 Cataract extraction status, left eye: Secondary | ICD-10-CM | POA: Diagnosis not present

## 2016-10-08 DIAGNOSIS — F329 Major depressive disorder, single episode, unspecified: Secondary | ICD-10-CM | POA: Insufficient documentation

## 2016-10-08 DIAGNOSIS — Z809 Family history of malignant neoplasm, unspecified: Secondary | ICD-10-CM | POA: Insufficient documentation

## 2016-10-08 DIAGNOSIS — I509 Heart failure, unspecified: Secondary | ICD-10-CM | POA: Insufficient documentation

## 2016-10-08 DIAGNOSIS — Z79899 Other long term (current) drug therapy: Secondary | ICD-10-CM | POA: Insufficient documentation

## 2016-10-08 DIAGNOSIS — I110 Hypertensive heart disease with heart failure: Secondary | ICD-10-CM | POA: Insufficient documentation

## 2016-10-08 DIAGNOSIS — G4730 Sleep apnea, unspecified: Secondary | ICD-10-CM | POA: Insufficient documentation

## 2016-10-08 DIAGNOSIS — Z800 Family history of malignant neoplasm of digestive organs: Secondary | ICD-10-CM | POA: Insufficient documentation

## 2016-10-08 DIAGNOSIS — I2510 Atherosclerotic heart disease of native coronary artery without angina pectoris: Secondary | ICD-10-CM | POA: Insufficient documentation

## 2016-10-08 DIAGNOSIS — Z803 Family history of malignant neoplasm of breast: Secondary | ICD-10-CM | POA: Insufficient documentation

## 2016-10-08 DIAGNOSIS — Z9841 Cataract extraction status, right eye: Secondary | ICD-10-CM | POA: Diagnosis not present

## 2016-10-08 DIAGNOSIS — E7800 Pure hypercholesterolemia, unspecified: Secondary | ICD-10-CM | POA: Insufficient documentation

## 2016-10-08 DIAGNOSIS — Z8249 Family history of ischemic heart disease and other diseases of the circulatory system: Secondary | ICD-10-CM | POA: Insufficient documentation

## 2016-10-08 DIAGNOSIS — K4090 Unilateral inguinal hernia, without obstruction or gangrene, not specified as recurrent: Secondary | ICD-10-CM | POA: Insufficient documentation

## 2016-10-08 DIAGNOSIS — R000 Tachycardia, unspecified: Secondary | ICD-10-CM | POA: Insufficient documentation

## 2016-10-08 DIAGNOSIS — M1990 Unspecified osteoarthritis, unspecified site: Secondary | ICD-10-CM | POA: Insufficient documentation

## 2016-10-08 DIAGNOSIS — Z8042 Family history of malignant neoplasm of prostate: Secondary | ICD-10-CM | POA: Insufficient documentation

## 2016-10-08 HISTORY — PX: INGUINAL HERNIA REPAIR: SHX194

## 2016-10-08 HISTORY — PX: INSERTION OF MESH: SHX5868

## 2016-10-08 LAB — TYPE AND SCREEN
ABO/RH(D): A POS
ANTIBODY SCREEN: NEGATIVE

## 2016-10-08 LAB — PROTIME-INR
INR: 1.11
Prothrombin Time: 14.4 seconds (ref 11.4–15.2)

## 2016-10-08 LAB — ABO/RH: ABO/RH(D): A POS

## 2016-10-08 LAB — APTT: aPTT: 34 seconds (ref 24–36)

## 2016-10-08 SURGERY — REPAIR, HERNIA, INGUINAL, ADULT
Anesthesia: General | Site: Inguinal | Laterality: Left

## 2016-10-08 MED ORDER — BUPIVACAINE-EPINEPHRINE 0.5% -1:200000 IJ SOLN
INTRAMUSCULAR | Status: DC | PRN
  Administered 2016-10-08: 20 mL

## 2016-10-08 MED ORDER — PHENYLEPHRINE 40 MCG/ML (10ML) SYRINGE FOR IV PUSH (FOR BLOOD PRESSURE SUPPORT)
PREFILLED_SYRINGE | INTRAVENOUS | Status: AC
  Filled 2016-10-08: qty 10

## 2016-10-08 MED ORDER — ARTIFICIAL TEARS OPHTHALMIC OINT
TOPICAL_OINTMENT | OPHTHALMIC | Status: AC
  Filled 2016-10-08: qty 3.5

## 2016-10-08 MED ORDER — SUCCINYLCHOLINE CHLORIDE 200 MG/10ML IV SOSY
PREFILLED_SYRINGE | INTRAVENOUS | Status: AC
  Filled 2016-10-08: qty 10

## 2016-10-08 MED ORDER — PHENYLEPHRINE HCL 10 MG/ML IJ SOLN
INTRAMUSCULAR | Status: DC | PRN
  Administered 2016-10-08: 80 ug via INTRAVENOUS
  Administered 2016-10-08: 120 ug via INTRAVENOUS
  Administered 2016-10-08: 40 ug via INTRAVENOUS
  Administered 2016-10-08: 80 ug via INTRAVENOUS
  Administered 2016-10-08 (×2): 40 ug via INTRAVENOUS

## 2016-10-08 MED ORDER — LIDOCAINE 2% (20 MG/ML) 5 ML SYRINGE
INTRAMUSCULAR | Status: AC
  Filled 2016-10-08: qty 5

## 2016-10-08 MED ORDER — ROCURONIUM BROMIDE 100 MG/10ML IV SOLN
INTRAVENOUS | Status: DC | PRN
  Administered 2016-10-08: 40 mg via INTRAVENOUS

## 2016-10-08 MED ORDER — PROMETHAZINE HCL 25 MG/ML IJ SOLN: 6.2500 mg | INTRAMUSCULAR | Status: DC | PRN

## 2016-10-08 MED ORDER — LACTATED RINGERS IV SOLN
INTRAVENOUS | Status: DC | PRN
  Administered 2016-10-08: 07:00:00 via INTRAVENOUS

## 2016-10-08 MED ORDER — FENTANYL CITRATE (PF) 250 MCG/5ML IJ SOLN
INTRAMUSCULAR | Status: AC
  Filled 2016-10-08: qty 5

## 2016-10-08 MED ORDER — BUPIVACAINE-EPINEPHRINE (PF) 0.5% -1:200000 IJ SOLN
INTRAMUSCULAR | Status: AC
  Filled 2016-10-08: qty 30

## 2016-10-08 MED ORDER — ONDANSETRON HCL 4 MG/2ML IJ SOLN
INTRAMUSCULAR | Status: DC | PRN
  Administered 2016-10-08: 4 mg via INTRAVENOUS

## 2016-10-08 MED ORDER — FENTANYL CITRATE (PF) 250 MCG/5ML IJ SOLN
INTRAMUSCULAR | Status: DC | PRN
  Administered 2016-10-08 (×2): 25 ug via INTRAVENOUS
  Administered 2016-10-08: 50 ug via INTRAVENOUS

## 2016-10-08 MED ORDER — GLYCOPYRROLATE 0.2 MG/ML IV SOSY
PREFILLED_SYRINGE | INTRAVENOUS | Status: DC | PRN
  Administered 2016-10-08: .2 mg via INTRAVENOUS

## 2016-10-08 MED ORDER — ROCURONIUM BROMIDE 10 MG/ML (PF) SYRINGE
PREFILLED_SYRINGE | INTRAVENOUS | Status: AC
  Filled 2016-10-08: qty 5

## 2016-10-08 MED ORDER — HYDROMORPHONE HCL 1 MG/ML IJ SOLN
INTRAMUSCULAR | Status: DC
Start: 2016-10-08 — End: 2016-10-08
  Filled 2016-10-08: qty 1

## 2016-10-08 MED ORDER — OXYCODONE HCL 5 MG PO TABS: 5.0000 mg | ORAL_TABLET | Freq: Once | ORAL | Status: DC | PRN

## 2016-10-08 MED ORDER — ALBUTEROL SULFATE HFA 108 (90 BASE) MCG/ACT IN AERS
INHALATION_SPRAY | RESPIRATORY_TRACT | Status: AC
  Filled 2016-10-08: qty 6.7

## 2016-10-08 MED ORDER — LIDOCAINE HCL (CARDIAC) 20 MG/ML IV SOLN
INTRAVENOUS | Status: DC | PRN
  Administered 2016-10-08: 80 mg via INTRATRACHEAL

## 2016-10-08 MED ORDER — PROPOFOL 10 MG/ML IV BOLUS
INTRAVENOUS | Status: DC | PRN
  Administered 2016-10-08: 110 mg via INTRAVENOUS

## 2016-10-08 MED ORDER — SUGAMMADEX SODIUM 200 MG/2ML IV SOLN
INTRAVENOUS | Status: DC | PRN
  Administered 2016-10-08: 200 mg via INTRAVENOUS

## 2016-10-08 MED ORDER — 0.9 % SODIUM CHLORIDE (POUR BTL) OPTIME
TOPICAL | Status: DC | PRN
  Administered 2016-10-08: 1000 mL

## 2016-10-08 MED ORDER — ALBUTEROL SULFATE HFA 108 (90 BASE) MCG/ACT IN AERS
INHALATION_SPRAY | RESPIRATORY_TRACT | Status: DC | PRN
  Administered 2016-10-08: 2 via RESPIRATORY_TRACT

## 2016-10-08 MED ORDER — DEXAMETHASONE SODIUM PHOSPHATE 10 MG/ML IJ SOLN
INTRAMUSCULAR | Status: DC | PRN
  Administered 2016-10-08: 5 mg via INTRAVENOUS

## 2016-10-08 MED ORDER — SUCCINYLCHOLINE CHLORIDE 20 MG/ML IJ SOLN
INTRAMUSCULAR | Status: DC | PRN
  Administered 2016-10-08: 120 mg via INTRAVENOUS

## 2016-10-08 MED ORDER — CHLORHEXIDINE GLUCONATE CLOTH 2 % EX PADS: 6.0000 | MEDICATED_PAD | Freq: Once | CUTANEOUS | Status: DC

## 2016-10-08 MED ORDER — ONDANSETRON HCL 4 MG/2ML IJ SOLN
INTRAMUSCULAR | Status: AC
  Filled 2016-10-08: qty 2

## 2016-10-08 MED ORDER — SUGAMMADEX SODIUM 200 MG/2ML IV SOLN
INTRAVENOUS | Status: AC
  Filled 2016-10-08: qty 2

## 2016-10-08 MED ORDER — PROPOFOL 10 MG/ML IV BOLUS
INTRAVENOUS | Status: AC
  Filled 2016-10-08: qty 20

## 2016-10-08 MED ORDER — OXYCODONE HCL 5 MG/5ML PO SOLN: 5.0000 mg | Freq: Once | ORAL | Status: DC | PRN

## 2016-10-08 MED ORDER — HYDROMORPHONE HCL 1 MG/ML IJ SOLN
0.2500 mg | INTRAMUSCULAR | Status: DC | PRN
  Administered 2016-10-08: 0.25 mg via INTRAVENOUS

## 2016-10-08 MED ORDER — ARTIFICIAL TEARS OPHTHALMIC OINT
TOPICAL_OINTMENT | OPHTHALMIC | Status: DC | PRN
  Administered 2016-10-08: 1 via OPHTHALMIC

## 2016-10-08 MED ORDER — OXYCODONE HCL 5 MG PO TABS: 5.0000 mg | ORAL_TABLET | ORAL | 0 refills | Status: DC | PRN

## 2016-10-08 MED ORDER — DEXAMETHASONE SODIUM PHOSPHATE 10 MG/ML IJ SOLN
INTRAMUSCULAR | Status: AC
  Filled 2016-10-08: qty 1

## 2016-10-08 SURGICAL SUPPLY — 48 items
APL SKNCLS STERI-STRIP NONHPOA (GAUZE/BANDAGES/DRESSINGS) ×1
BENZOIN TINCTURE PRP APPL 2/3 (GAUZE/BANDAGES/DRESSINGS) ×3 IMPLANT
BLADE CLIPPER SURG (BLADE) ×2 IMPLANT
BLADE SURG 10 STRL SS (BLADE) ×3 IMPLANT
BLADE SURG 15 STRL LF DISP TIS (BLADE) ×1 IMPLANT
BLADE SURG 15 STRL SS (BLADE) ×3
CHLORAPREP W/TINT 26ML (MISCELLANEOUS) ×3 IMPLANT
CLOSURE WOUND 1/2 X4 (GAUZE/BANDAGES/DRESSINGS) ×1
COVER SURGICAL LIGHT HANDLE (MISCELLANEOUS) ×3 IMPLANT
DRAIN PENROSE 1/2X12 LTX STRL (WOUND CARE) ×2 IMPLANT
DRAPE INCISE IOBAN 66X45 STRL (DRAPES) ×3 IMPLANT
DRAPE LAPAROTOMY TRNSV 102X78 (DRAPE) ×3 IMPLANT
DRAPE UTILITY XL STRL (DRAPES) ×6 IMPLANT
DRSG TEGADERM 4X4.75 (GAUZE/BANDAGES/DRESSINGS) ×5 IMPLANT
DRSG TELFA 3X8 NADH (GAUZE/BANDAGES/DRESSINGS) ×3 IMPLANT
ELECT CAUTERY BLADE 6.4 (BLADE) ×3 IMPLANT
ELECT REM PT RETURN 9FT ADLT (ELECTROSURGICAL) ×3
ELECTRODE REM PT RTRN 9FT ADLT (ELECTROSURGICAL) ×1 IMPLANT
GAUZE SPONGE 4X4 16PLY XRAY LF (GAUZE/BANDAGES/DRESSINGS) ×3 IMPLANT
GLOVE BIOGEL PI IND STRL 8 (GLOVE) ×1 IMPLANT
GLOVE BIOGEL PI INDICATOR 8 (GLOVE) ×2
GLOVE ECLIPSE 8.0 STRL XLNG CF (GLOVE) ×3 IMPLANT
GOWN STRL REUS W/ TWL LRG LVL3 (GOWN DISPOSABLE) ×2 IMPLANT
GOWN STRL REUS W/TWL LRG LVL3 (GOWN DISPOSABLE) ×9
KIT BASIN OR (CUSTOM PROCEDURE TRAY) ×3 IMPLANT
KIT ROOM TURNOVER OR (KITS) ×3 IMPLANT
MESH HERNIA 6X6 BARD (Mesh General) IMPLANT
MESH HERNIA BARD 6X6 (Mesh General) ×2 IMPLANT
NDL HYPO 25GX1X1/2 BEV (NEEDLE) ×1 IMPLANT
NEEDLE HYPO 25GX1X1/2 BEV (NEEDLE) ×3 IMPLANT
NS IRRIG 1000ML POUR BTL (IV SOLUTION) ×3 IMPLANT
PACK SURGICAL SETUP 50X90 (CUSTOM PROCEDURE TRAY) ×3 IMPLANT
PAD ARMBOARD 7.5X6 YLW CONV (MISCELLANEOUS) ×3 IMPLANT
PAD DRESSING TELFA 3X8 NADH (GAUZE/BANDAGES/DRESSINGS) ×1 IMPLANT
PENCIL BUTTON HOLSTER BLD 10FT (ELECTRODE) ×3 IMPLANT
SPECIMEN JAR SMALL (MISCELLANEOUS) IMPLANT
SPONGE LAP 18X18 X RAY DECT (DISPOSABLE) ×3 IMPLANT
STRIP CLOSURE SKIN 1/2X4 (GAUZE/BANDAGES/DRESSINGS) ×2 IMPLANT
SUT MON AB 4-0 PC3 18 (SUTURE) ×3 IMPLANT
SUT PROLENE 2 0 CT2 30 (SUTURE) ×6 IMPLANT
SUT SILK 2 0 SH (SUTURE) IMPLANT
SUT VIC AB 2-0 SH 18 (SUTURE) ×3 IMPLANT
SUT VIC AB 3-0 SH 27 (SUTURE) ×6
SUT VIC AB 3-0 SH 27XBRD (SUTURE) ×1 IMPLANT
SUT VICRYL AB 3 0 TIES (SUTURE) ×3 IMPLANT
SYR CONTROL 10ML LL (SYRINGE) ×3 IMPLANT
TOWEL OR 17X24 6PK STRL BLUE (TOWEL DISPOSABLE) ×3 IMPLANT
TOWEL OR 17X26 10 PK STRL BLUE (TOWEL DISPOSABLE) ×1 IMPLANT

## 2016-10-08 NOTE — Discharge Instructions (Addendum)
[facility]   INGUINAL HERNIA REPAIR: POST OP INSTRUCTIONS  Always review your discharge instruction sheet given to you by the facility where your surgery was performed. IF YOU HAVE DISABILITY OR FAMILY LEAVE FORMS, YOU MUST BRING THEM TO THE OFFICE FOR PROCESSING.   DO NOT GIVE THEM TO YOUR DOCTOR.  1. A  prescription for pain medication may be given to you upon discharge.  Take your pain medication as prescribed, if needed.  If narcotic pain medicine is not needed, then you may take acetaminophen (Tylenol) or ibuprofen (Advil) as needed. 2. Take your usually prescribed medications unless otherwise directed. 3. If you need a refill on your pain medication, please contact your pharmacy.  They will contact our office to request authorization. Prescriptions will not be filled after 5 pm or on week-ends. 4. You should follow a light diet the first 24 hours after arrival home, such as soup and crackers, etc.  Be sure to include lots of fluids daily.  Resume your normal diet the day after surgery. 5. Most patients will experience some swelling and bruising in the groin area (and scrotum in men).  Ice packs and reclining will help.  Swelling and bruising can take many days to resolve.  6. It is common to experience some constipation if taking pain medication after surgery.  Increasing fluid intake and taking a stool softener (such as Colace) will usually help or prevent this problem from occurring.  A mild laxative (Milk of Magnesia or Miralax) should be taken according to package directions if there are no bowel movements after 48 hours. 7. Unless discharge instructions indicate otherwise, you may remove your bandages 4 days after surgery, and you may shower at that time.  You may have steri-strips (small skin tapes) in place directly over the incision.  These strips should be left on the skin.  If your surgeon used skin glue on the incision, you may shower in 24 hours.  The glue  will flake off over the next 2-3 weeks.  Any sutures or staples will be removed at the office during your follow-up visit. 8. ACTIVITIES: Do not lie flat.  You may resume regular (light) daily activities beginning the next day--such as daily self-care, walking, climbing stairs--gradually increasing activities as tolerated.  You may have sexual intercourse when it is comfortable.  Refrain from any heavy lifting or straining-nothing over 10 pounds for 6 weeks.  Do note lie flat for 2-3 days. a. You may drive when you are no longer taking prescription pain medication, you can comfortably wear a seatbelt, and you can safely maneuver your car and apply brakes. b. RETURN TO WORK:  Desk work/Light work in 1-2 weeks, full duty in 6 weeks._________________________________________________________ 9. You should see your doctor in the office for a follow-up appointment approximately 2-3 weeks after your surgery.  Make sure that you call for this appointment within a day or two after you arrive home to insure a convenient appointment time. 10. OTHER INSTRUCTIONS:  _Restart Eliquis on 10/11/16._________________________________________________________________________________________________________________________________________________________________________________________  WHEN TO CALL YOUR DOCTOR: 1. Fever over 101.0 2. Inability to urinate 3. Nausea and/or vomiting 4. Extreme swelling or bruising 5. Continued bleeding from incision. 6. Increased pain, redness, or drainage from the incision  The clinic staff is available to answer your questions during regular business hours.  Please dont hesitate to call and ask to speak to one of the nurses for clinical concerns.  If you have a medical emergency, go to the nearest  emergency room or call 911.  A surgeon from [facility] is always on call at the hospital   [address], [place], [place], [place]  [address] ?  [address], [place],  [place]   [address] [phone] ? [phone] ? FAX [phone] Web site: [email or web address]

## 2016-10-08 NOTE — Transfer of Care (Signed)
Immediate Anesthesia Transfer of Care Note  Patient: Vincent Sharp  Procedure(s) Performed: Procedure(s) with comments: LEFT INGUINAL HERNIA REPAIR WITH MESH (Left) - TAP BLOCK  AND LMA INSERTION OF MESH (Left)  Patient Location: PACU  Anesthesia Type:General  Level of Consciousness: awake, alert  and patient cooperative  Airway & Oxygen Therapy: Patient Spontanous Breathing and Patient connected to face mask oxygen  Post-op Assessment: Report given to RN, Post -op Vital signs reviewed and stable, Patient moving all extremities X 4 and Patient able to stick tongue midline  Post vital signs: Reviewed and stable  Last Vitals:  Vitals:   10/08/16 0546  BP: 138/64  Pulse: (!) 56  Resp: 18  Temp: 36.6 C  SpO2: 100%    Last Pain:  Vitals:   10/08/16 0615  TempSrc:   PainSc: 8       Patients Stated Pain Goal: 3 (50/27/74 1287)  Complications: No apparent anesthesia complications

## 2016-10-08 NOTE — Interval H&P Note (Signed)
History and Physical Interval Note:  10/08/2016 7:16 AM  Vincent Sharp  has presented today for surgery, with the diagnosis of large left inguinal hernia  The various methods of treatment have been discussed with the patient and family. After consideration of risks, benefits and other options for treatment, the patient has consented to  Procedure(s) with comments: LEFT INGUINAL HERNIA REPAIR WITH MESH (Left) - TAP BLOCK  AND LMA INSERTION OF MESH (Left) as a surgical intervention .  The patient's history has been reviewed, patient examined, no change in status, stable for surgery.  I have reviewed the patient's chart and labs.  Questions were answered to the patient's satisfaction.     Kellene Mccleary Lenna Sciara

## 2016-10-08 NOTE — Anesthesia Procedure Notes (Signed)
Procedure Name: Intubation Date/Time: 10/08/2016 7:48 AM Performed by: Candida Peeling RAY Pre-anesthesia Checklist: Patient identified, Emergency Drugs available, Suction available and Patient being monitored Patient Re-evaluated:Patient Re-evaluated prior to induction Oxygen Delivery Method: Circle system utilized Preoxygenation: Pre-oxygenation with 100% oxygen Induction Type: IV induction Ventilation: Mask ventilation without difficulty and Nasal airway inserted- appropriate to patient size Laryngoscope Size: Glidescope (T3) Grade View: Grade I Tube type: Oral Tube size: 7.0 mm Number of attempts: 1 Airway Equipment and Method: Video-laryngoscopy Placement Confirmation: ETT inserted through vocal cords under direct vision,  positive ETCO2 and breath sounds checked- equal and bilateral Secured at: 22 cm Tube secured with: Tape Dental Injury: Teeth and Oropharynx as per pre-operative assessment  Difficulty Due To: Difficulty was anticipated, Difficult Airway- due to limited oral opening and Difficult Airway- due to reduced neck mobility

## 2016-10-08 NOTE — Anesthesia Preprocedure Evaluation (Signed)
Anesthesia Evaluation  Patient identified by MRN, date of birth, ID band Patient awake    Reviewed: Allergy & Precautions, H&P , NPO status , Patient's Chart, lab work & pertinent test results, reviewed documented beta blocker date and time   History of Anesthesia Complications Negative for: history of anesthetic complications  Airway Mallampati: II  TM Distance: >3 FB Neck ROM: Full    Dental no notable dental hx. (+) Teeth Intact, Partial Lower, Dental Advisory Given   Pulmonary sleep apnea ,    Pulmonary exam normal breath sounds clear to auscultation       Cardiovascular hypertension, Pt. on medications and Pt. on home beta blockers + CAD and + CABG   Rhythm:Irregular Rate:Tachycardia     Neuro/Psych PSYCHIATRIC DISORDERS Depression negative neurological ROS     GI/Hepatic negative GI ROS, Neg liver ROS,   Endo/Other  negative endocrine ROS  Renal/GU Renal InsufficiencyRenal disease     Musculoskeletal  (+) Arthritis ,   Abdominal   Peds  Hematology   Anesthesia Other Findings 60% EF  Reproductive/Obstetrics                             Anesthesia Physical  Anesthesia Plan  ASA: III  Anesthesia Plan: General   Post-op Pain Management:    Induction: Intravenous  PONV Risk Score and Plan: 2 and Ondansetron, Treatment may vary due to age or medical condition and Midazolam  Airway Management Planned: Oral ETT  Additional Equipment:   Intra-op Plan:   Post-operative Plan: Extubation in OR  Informed Consent: I have reviewed the patients History and Physical, chart, labs and discussed the procedure including the risks, benefits and alternatives for the proposed anesthesia with the patient or authorized representative who has indicated his/her understanding and acceptance.   Dental advisory given  Plan Discussed with: Anesthesiologist, CRNA and Surgeon  Anesthesia Plan  Comments:         Anesthesia Quick Evaluation

## 2016-10-08 NOTE — Anesthesia Postprocedure Evaluation (Signed)
Anesthesia Post Note  Patient: Vincent Sharp  Procedure(s) Performed: Procedure(s) (LRB): LEFT INGUINAL HERNIA REPAIR WITH MESH (Left) INSERTION OF MESH (Left)     Patient location during evaluation: PACU Anesthesia Type: General Level of consciousness: awake and alert Pain management: pain level controlled Vital Signs Assessment: post-procedure vital signs reviewed and stable Respiratory status: spontaneous breathing, nonlabored ventilation and respiratory function stable Cardiovascular status: blood pressure returned to baseline and stable Postop Assessment: no signs of nausea or vomiting Anesthetic complications: no    Last Vitals:  Vitals:   10/08/16 0955 10/08/16 1011  BP: 133/66 128/69  Pulse:    Resp:    Temp:    SpO2:  96%    Last Pain:  Vitals:   10/08/16 1019  TempSrc:   PainSc: 10-Worst pain ever                 Lynda Rainwater

## 2016-10-08 NOTE — H&P (View-Only) (Signed)
Vincent Sharp 09/28/2016 10:30 AM Location: [facility] Patient #: [id number] DOB: 1932/09/30 Married / Language: English / Race: Refused to Report/Unreported Male  History of Present Illness Vincent Hollingshead MD; 09/28/2016 11:28 AM) The patient is a 83 year old male.   Note:He is referred by Dr. Diona Fanti for consultation regarding a large left inguinal hernia. He states he's had swelling in the groin for about 10 or 11 years. It has extended down into his left hemiscrotum. He has a history of bilateral hydrocele repair as well as a right inguinal hernia repair in 2005. He developed a left scrotum infection after that that had to be drained. This hernia is now symptomatic and causing him intermittent pain. He does have intermittent difficulty with urination and takes medication for this. He denies constipation. He has chronic atrial fibrillation and is status post cardioversion. He takes chronic Eliquis. His cardiologist is Dr. Wynonia Sharp. He is here with his wife and his daughter. He is interested and hernia repair.  Past Surgical History (Vincent Sharp, [place]; 09/28/2016 10:31 AM) Cataract Surgery Bilateral. Colon Polyp Removal - Colonoscopy Coronary Artery Bypass Graft Hemorrhoidectomy Knee Surgery Right. Shoulder Surgery Right. Tonsillectomy  Diagnostic Studies History (Vincent Sharp, [place]; 09/28/2016 10:31 AM) Colonoscopy 1-5 years ago  Allergies (Vincent Sharp, [place]; 09/28/2016 10:32 AM) No Known Drug Allergies 09/28/2016 Allergies Reconciled  Medication History (Vincent Sharp, [place]; 09/28/2016 10:32 AM) Amiodarone HCl (200MG  Tablet, Oral) Active. Bystolic (10MG  Tablet, Oral) Active. Eliquis (5MG  Tablet, Oral) Active. Furosemide (40MG  Tablet, Oral) Active. Atorvastatin Calcium (40MG  Tablet, Oral) Active. Medications Reconciled  Social History (Vincent Sharp, [place]; 09/28/2016 10:31 AM) Caffeine use Carbonated beverages, Coffee, Tea. No  alcohol use No drug use Tobacco use Never smoker.  Family History (Vincent Sharp, [place]; 09/28/2016 10:31 AM) Breast Cancer Sister. Cancer Sister. Heart Disease Mother. Malignant Neoplasm Of Pancreas Father. Prostate Cancer Brother.  Other Problems (Vincent Sharp, [place]; 09/28/2016 10:31 AM) Arthritis Atrial Fibrillation Congestive Heart Failure Depression Hemorrhoids High blood pressure Hypercholesterolemia Sleep Apnea     Review of Systems Vincent Hollingshead MD; 09/28/2016 11:42 AM) General Not Present- Appetite Loss, Chills, Fatigue, Fever, Night Sweats, Weight Gain and Weight Loss. Skin Present- Dryness. Not Present- Change in Wart/Mole, Hives, Jaundice, New Lesions, Non-Healing Wounds, Rash and Ulcer. HEENT Present- Wears glasses/contact lenses. Not Present- Earache, Hearing Loss, Hoarseness, Nose Bleed, Oral Ulcers, Ringing in the Ears, Seasonal Allergies, Sinus Pain, Sore Throat, Visual Disturbances and Yellow Eyes. Respiratory Present- Snoring. Not Present- Bloody sputum, Chronic Cough, Difficulty Breathing and Wheezing. Breast Not Present- Breast Mass, Breast Pain, Nipple Discharge and Skin Changes. Cardiovascular Present- Shortness of Breath. Not Present- Chest Pain, Difficulty Breathing Lying Down, Leg Cramps, Palpitations, Rapid Heart Rate and Swelling of Extremities. Gastrointestinal Not Present- Abdominal Pain, Bloating, Bloody Stool, Change in Bowel Habits, Chronic diarrhea, Constipation, Difficulty Swallowing, Excessive gas, Gets full quickly at meals, Hemorrhoids, Indigestion, Nausea, Rectal Pain and Vomiting. Male Genitourinary Present- Frequency, Impotence and Urgency. Not Present- Blood in Urine, Change in Urinary Stream, Nocturia, Painful Urination and Urine Leakage.  Note: Has memory problems and limited mobility.   Vitals (Vincent A. Brown RMA; 09/28/2016 10:32 AM) 09/28/2016 10:31 AM Weight: 241 lb Height: 72in Body Surface Area: 2.31 m  Body Mass Index: 32.69 kg/m  Temp.: 98.72F  Pulse: 57 (Regular)  P.OX: 98% (Room air) BP: 112/72 (Sitting, Left Arm, Standard)      Physical Exam Vincent Hollingshead MD; 09/28/2016 11:46 AM)  The physical  exam findings are as follows: Note:GENERAL APPEARANCE: Obese, elderly male in NAD. Pleasant and cooperative.  EARS, NOSE, MOUTH THROAT: [place]/AT external ears: no lesions or deformities external nose: no lesions or deformities hearing: grossly normal lips: moist, no deformities EYES external: conjunctiva, lids, sclerae normal pupils: equal, round   CV ascultation: RRR, no murmur extremity edema: yes, R > L extremity varicosities: Yes, bilateral venous stasis changes  RESP/CHEST auscultation: breath sounds equal and clear respiratory effort: normal  GASTROINTESTINAL abdomen: Soft, non-tender, non-distended, no masses liver and spleen: not enlarged. hernia: large left inguinal hernia extending into distal left hemiscrotum scar: right groin scar  GENITOURINARY scrotum: no masses penis: no lesions  MUSCULOSKELETAL station and gait: a little unsteady digits/nails: no clubbing or cyanosis instability: slight   SKIN jaundice: none   NEUROLOGIC speech: slow, appropriate  PSYCHIATRIC alertness and orientation: normal mood/affect/behavior: normal judgement and insight: normal    Assessment & Plan Vincent Hollingshead MD; 09/28/2016 11:31 AM)  Nira Conn LEFT INGUINAL HERNIA (K40.30) Impression: This is chronic. Hernia extends down into his distal scrotum. No obstruction symptoms.  Plan: Open repair of chronically incarcerated left inguinal hernia with mesh. Stop Eliquis 3 days prior to surgery. I have explained the procedure, risks, and aftercare of inguinal hernia repair. Risks include but are not limited to bleeding, infection, wound problems, anesthesia, recurrent atrial fibrillation, recurrence, bladder or intestine injury, urinary retention, testicular  dysfunction, chronic pain, mesh problems. He seems to understand and agrees with the plan. I recommended he ran to a lift chair postoperatively.  Jackolyn Confer, M.D.

## 2016-10-08 NOTE — Progress Notes (Signed)
Report given to philip rn as caregiver 

## 2016-10-08 NOTE — Op Note (Signed)
OPERATIVE NOTE-INGUINAL HERNIA REPAIR  Preoperative diagnosis:  Large left inguinal hernia.  Postoperative diagnosis:  Same (indirect hernia)  Procedure:  Left inguinal hernia repair with mesh.  Surgeon:  Jackolyn Confer, M.D.  Anesthesia:  General with local ([place]).  Indication:  This is an 83 year old male with a large left inguinal hernia extending into his left hemiscrotum. He now presents for elective repair.  EBL < 100 ml  Technique:  He was seen in the holding room and the left groin was marked with my initials. He was brought to the operating, placed supine on the operating table, and the anesthetic was administered by the anesthesiologist. The hair in the groin area was clipped as was felt to be necessary. This area was then sterilely prepped and draped. A timeout was performed.  Local anesthetic was infiltrated in the superficial and deep tissues in the left groin.  An incision was made through the skin and subcutaneous tissue until the external oblique aponeurosis was identified.  Local anesthetic was infiltrated deep to the external oblique aponeurosis. The external oblique aponeurosis was divided through the external ring medially and back toward the anterior superior iliac spine laterally. Using blunt dissection, the shelving edge of the inguinal ligament was identified inferiorly and the internal oblique aponeurosis and muscle were identified superiorly. The ilioinguinal nerve was identified and preserved.  A large indirect hernia was identified radiating down into the scrotum.  Using blunt dissection, the hernia was pulled out of the scrotum, separated from the spermatic cord contents, and reduced through a large indirect defect.   A piece of 6" x 6" polypropylene mesh was brought into the field, cut to 4" by 6",  and anchored 1-2 cm medial to the pubic tubercle with 2-0 Prolene suture. The inferior aspect of the mesh was anchored to the shelving edge of the inguinal  ligament with running 2-0 Prolene suture to a level 1-2 cm lateral to the internal ring. A slit was cut in the mesh creating 2 tails. These were wrapped around the spermatic cord. The superior aspect of the mesh was anchored to the internal oblique aponeurosis and muscle with interrupted 2-0 Vicryl sutures. The 2 tails of the mesh were then crossed creating a new internal ring and were anchored to the shelving edge of the inguinal ligament with 2-0 Prolene suture. The tip of a hemostat could be placed through the new aperture. The lateral aspect of the mesh was then tucked deep to the external oblique aponeurosis.  The wound was inspected and hemostasis was adequate. The external oblique aponeurosis was then closed over the mesh and cord with running 3-0 Vicryl suture. The subcutaneous tissue was closed with running 3-0 Vicryl suture. The skin closed with a running 4-0 Monocryl subcuticular stitch.  Steri-Strips and a sterile dressing were applied.  The procedure was well-tolerated without any apparent complications and he was taken to the recovery room in satisfactory condition.

## 2016-10-11 ENCOUNTER — Encounter (HOSPITAL_COMMUNITY): Payer: Self-pay | Admitting: General Surgery

## 2016-12-09 DIAGNOSIS — H43813 Vitreous degeneration, bilateral: Secondary | ICD-10-CM | POA: Diagnosis not present

## 2016-12-09 DIAGNOSIS — H40013 Open angle with borderline findings, low risk, bilateral: Secondary | ICD-10-CM | POA: Diagnosis not present

## 2016-12-09 DIAGNOSIS — H04123 Dry eye syndrome of bilateral lacrimal glands: Secondary | ICD-10-CM | POA: Diagnosis not present

## 2016-12-09 DIAGNOSIS — Z961 Presence of intraocular lens: Secondary | ICD-10-CM | POA: Diagnosis not present

## 2016-12-29 ENCOUNTER — Other Ambulatory Visit: Payer: Self-pay | Admitting: Dermatology

## 2017-01-03 IMAGING — US US ABDOMEN COMPLETE
1 series · 13 of 25 positions shown · non-contrast
Comparison: 09/10/2010 abdominal CT

CLINICAL DATA: Abnormal liver function tests

EXAM:
ULTRASOUND ABDOMEN COMPLETE

[Series 1: us abdomen complete · 0.22mm/px · 13 of 96 slices shown]
[im 1/96]
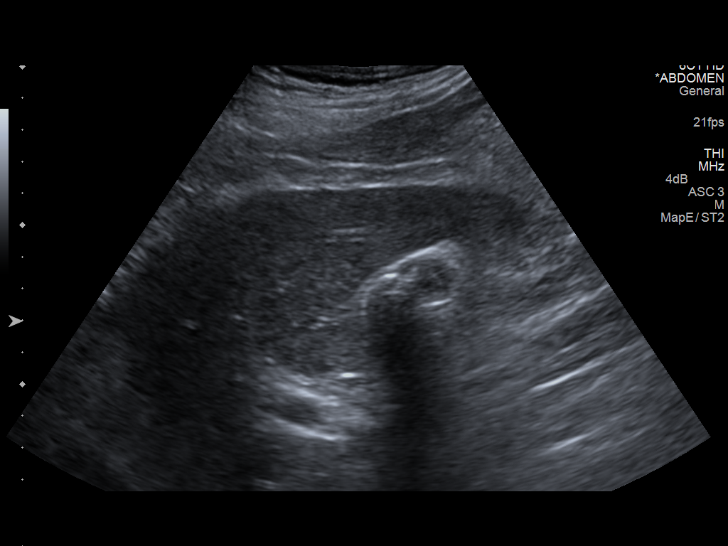
[im 8/96]
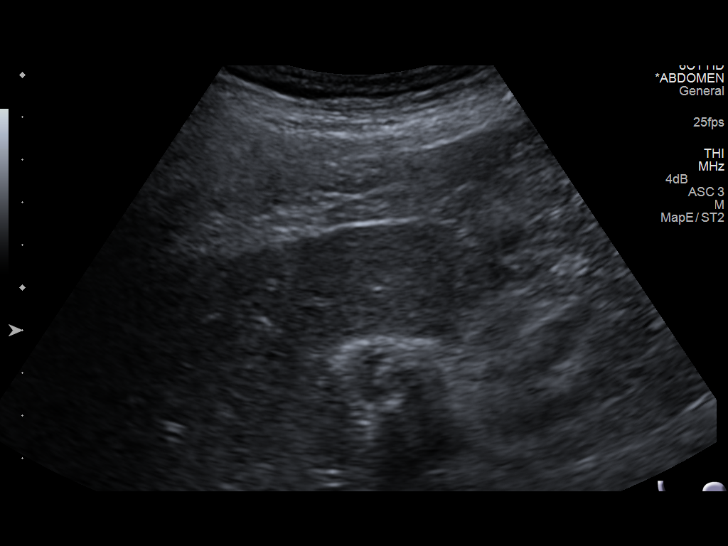
[im 16/96]
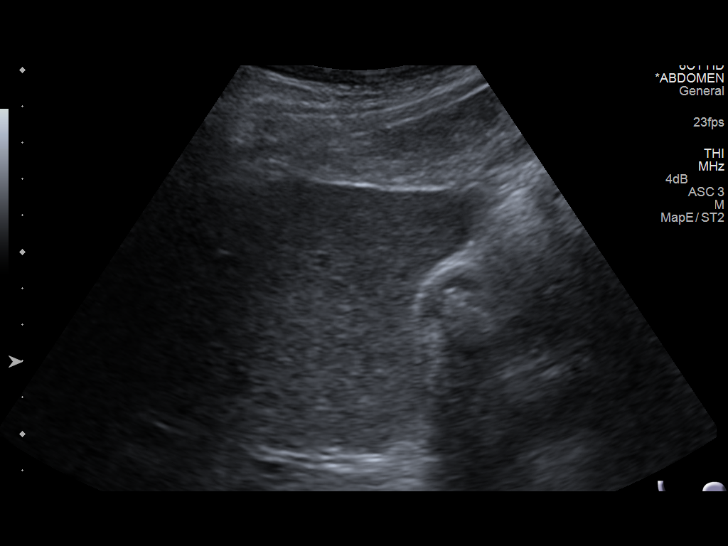
[im 24/96]
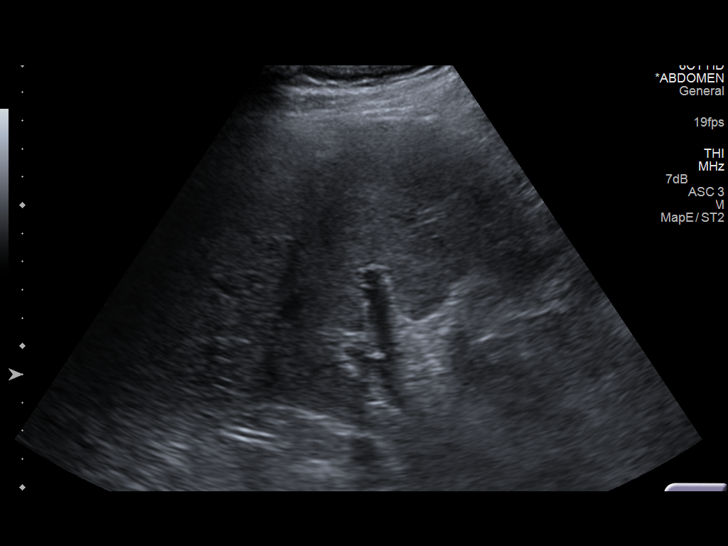
[im 32/96]
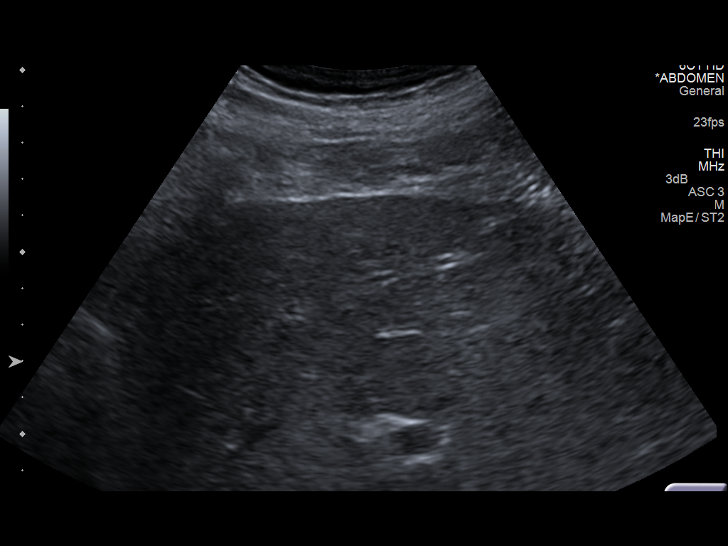
[im 40/96]
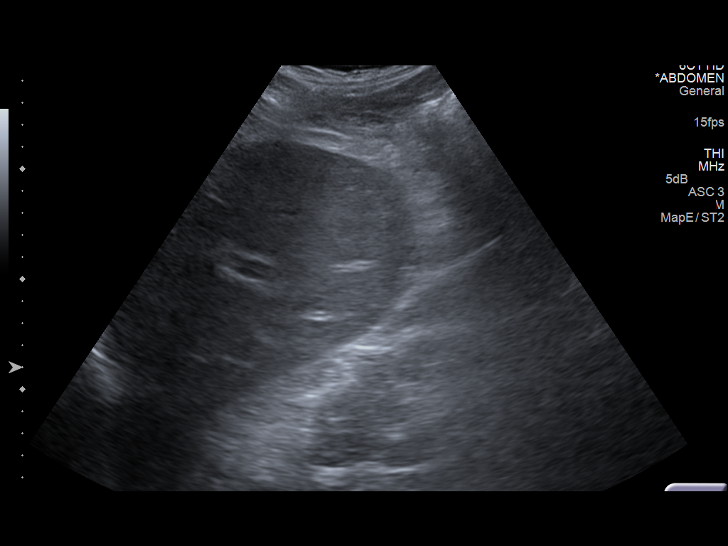
[im 48/96]
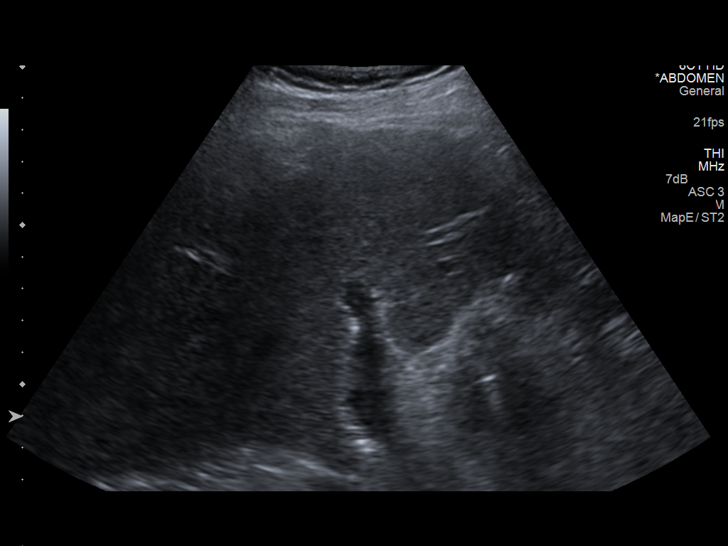
[im 56/96]
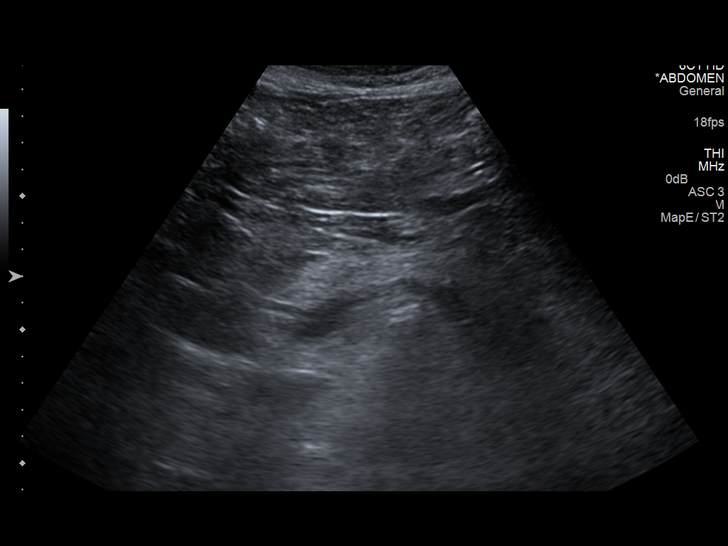
[im 64/96]
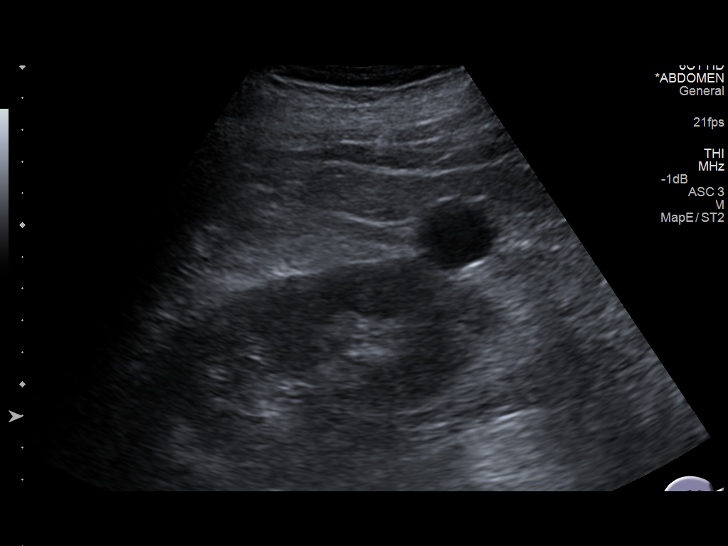
[im 72/96]
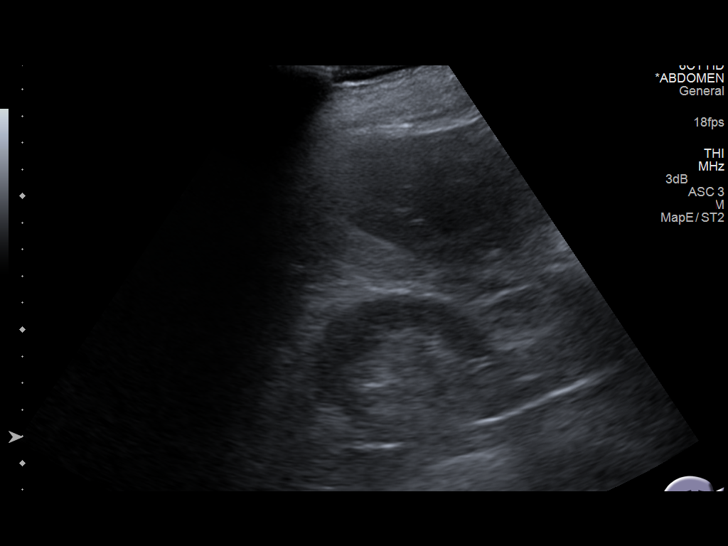
[im 80/96]
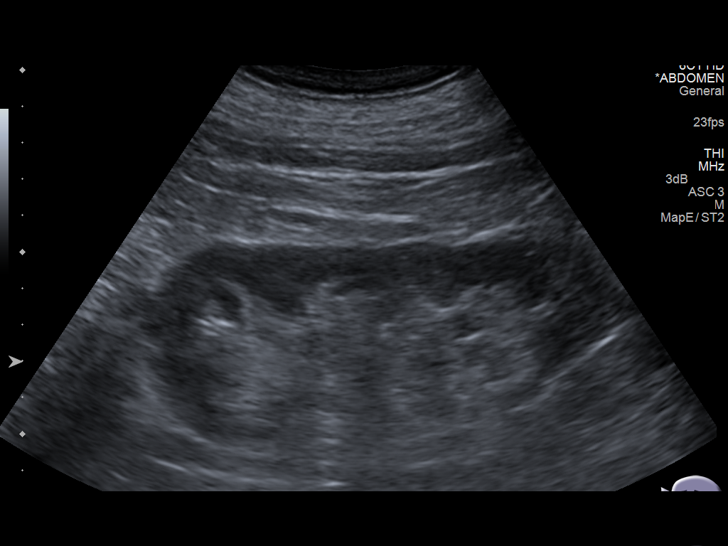
[im 88/96]
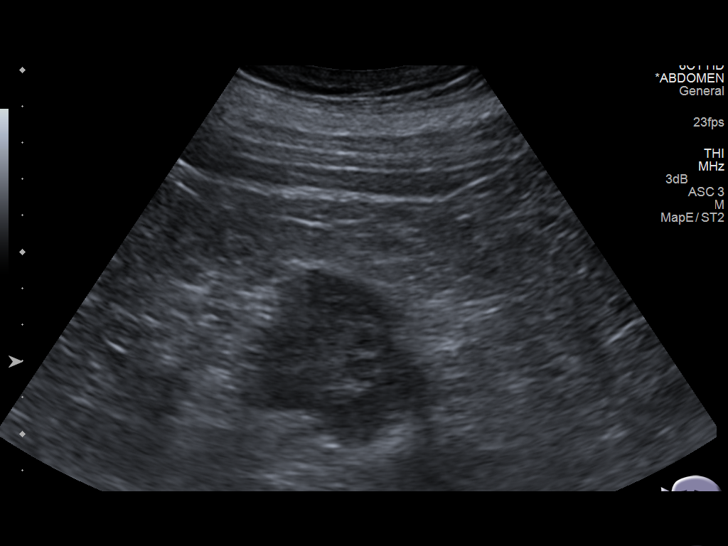
[im 96/96]
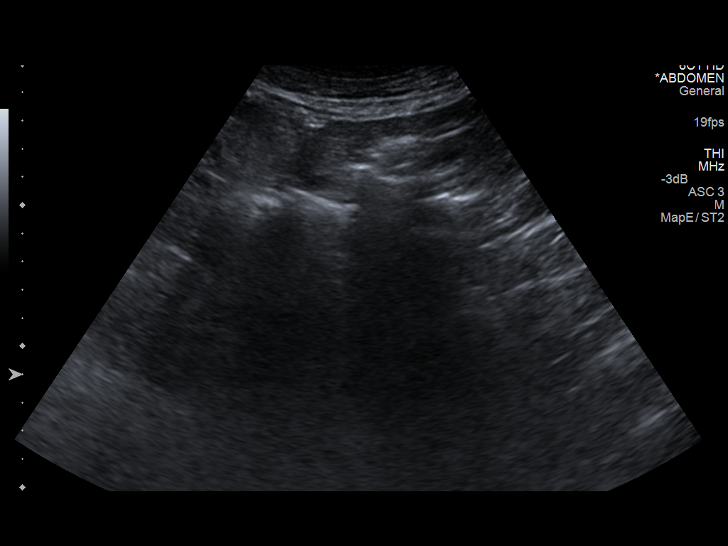

[13 of 25 positions shown; findings below may reference images not displayed]

FINDINGS: Gallbladder: The gallbladder is partially contracted around numerous
gallstones. Where not obscured there is no wall thickening. No focal
tenderness per sonographer exam.

Common bile duct: Diameter: 2 mm.

Liver: There is a 2 cm echogenic ovoid mass in the central liver
which was not seen on noncontrast study from 8428. Antegrade flow in
the imaged portal venous system.

IVC: Unremarkable limited visualization.

Pancreas: Visualized portion unremarkable.

Spleen: Size and appearance within normal limits.

Right Kidney: Length: 13 cm. Echogenicity within normal limits. No
solid mass or hydronephrosis visualized. An exophytic 27 mm cyst is
noted.

Left Kidney: Length: 13 cm. Echogenicity within normal limits. No
mass or hydronephrosis visualized.

Abdominal aorta: The proximal and bifurcation is not visible due to
bowel gas. No evidence of aneurysm.
IMPRESSION: 1. 2 cm echogenic mass in the central liver, not seen on abdominal
CT from 8428. Enhanced liver MRI or abdominal CT is recommended; CT
is the more reliable study in the inpatient setting.
2. Cholelithiasis without acute cholecystitis.

## 2017-09-29 ENCOUNTER — Ambulatory Visit
Admission: RE | Admit: 2017-09-29 | Discharge: 2017-09-29 | Disposition: A | Discharge status: Home / Self Care | Payer: Medicare Other | Source: Ambulatory Visit | Attending: Cardiology | Admitting: Cardiology

## 2017-09-29 ENCOUNTER — Other Ambulatory Visit: Payer: Self-pay | Admitting: Cardiology

## 2017-09-29 DIAGNOSIS — I480 Paroxysmal atrial fibrillation: Secondary | ICD-10-CM

## 2017-12-06 ENCOUNTER — Other Ambulatory Visit: Payer: Self-pay | Admitting: Internal Medicine

## 2017-12-06 NOTE — Telephone Encounter (Signed)
New Message     *STAT* If patient is at the pharmacy, call can be transferred to refill team.   1. Which medications need to be refilled? (please list name of each medication and dose if known) amiodarone (PACERONE) 200 MG tablet  2. Which pharmacy/location (including street and city if local pharmacy) is medication to be sent to? [id number] - [place], [place] [address]  3. Do they need a 30 day or 90 day supply? 22   Brandy with Dr. Wynonia Lawman office is calling patient is needing a refill on his medication.

## 2017-12-07 ENCOUNTER — Other Ambulatory Visit: Payer: Self-pay

## 2017-12-07 MED ORDER — AMIODARONE HCL 100 MG PO TABS: 100.0000 mg | ORAL_TABLET | Freq: Every day | ORAL | 4 refills | Status: DC

## 2017-12-07 NOTE — Telephone Encounter (Signed)
PER DR Margaretann Loveless  Call pt and verify dosage opf amiodarone.  Called and spoke with Towanda Malkin she states that pt is only taking 100mg  daily. Will refill this amount until scheduled appt in February.  D/w Dr Evette Cristal to fill until scheduled appt

## 2017-12-23 ENCOUNTER — Telehealth: Payer: Self-pay | Admitting: Cardiology

## 2017-12-23 MED ORDER — NEBIVOLOL HCL 10 MG PO TABS: 10.0000 mg | ORAL_TABLET | Freq: Every day | ORAL | 0 refills | Status: DC

## 2017-12-23 NOTE — Telephone Encounter (Signed)
° ° ° °  1. Which medications need to be refilled? (please list name of each medication and dose if known) Bystolic 10mg  1QD  2. Which pharmacy/location (including street and city if local pharmacy) is medication to be sent to? [id number]  3. Do they need a 30 day or 90 day supply? [place]

## 2017-12-23 NOTE — Telephone Encounter (Signed)
[facility] [place] Patient is due for follow up in February 2020. Will have front desk contact patient to schedule appointment.

## 2017-12-26 NOTE — Telephone Encounter (Signed)
Patient is scheduled with Dr. Margaretann Loveless on 2/19

## 2018-03-20 ENCOUNTER — Other Ambulatory Visit: Payer: Self-pay | Admitting: Cardiology

## 2018-03-20 NOTE — Telephone Encounter (Signed)
30 day supply of [facility]olic [facility] [place] as requested per Dr. Agustin Cree. Patient is scheduled to see Dr. Margaretann Loveless on 04/12/2018 at the [facility] office and will receive further refills during appointment.

## 2018-03-21 ENCOUNTER — Other Ambulatory Visit: Payer: Self-pay | Admitting: Cardiology

## 2018-03-23 ENCOUNTER — Other Ambulatory Visit: Payer: Self-pay | Admitting: Cardiology

## 2018-04-12 ENCOUNTER — Ambulatory Visit: Payer: Medicare Other | Admitting: Internal Medicine

## 2018-04-17 ENCOUNTER — Encounter (INDEPENDENT_AMBULATORY_CARE_PROVIDER_SITE_OTHER): Payer: Self-pay

## 2018-04-17 ENCOUNTER — Encounter: Payer: Self-pay | Admitting: Cardiology

## 2018-04-17 ENCOUNTER — Ambulatory Visit (INDEPENDENT_AMBULATORY_CARE_PROVIDER_SITE_OTHER): Payer: Medicare Other | Admitting: Cardiology

## 2018-04-17 VITALS — BP 120/54 | HR 53 | Ht 72.0 in | Wt 250.0 lb

## 2018-04-17 DIAGNOSIS — E782 Mixed hyperlipidemia: Secondary | ICD-10-CM

## 2018-04-17 DIAGNOSIS — Z79899 Other long term (current) drug therapy: Secondary | ICD-10-CM | POA: Diagnosis not present

## 2018-04-17 DIAGNOSIS — I480 Paroxysmal atrial fibrillation: Secondary | ICD-10-CM | POA: Diagnosis not present

## 2018-04-17 DIAGNOSIS — I2510 Atherosclerotic heart disease of native coronary artery without angina pectoris: Secondary | ICD-10-CM

## 2018-04-17 DIAGNOSIS — I5032 Chronic diastolic (congestive) heart failure: Secondary | ICD-10-CM

## 2018-04-17 DIAGNOSIS — I872 Venous insufficiency (chronic) (peripheral): Secondary | ICD-10-CM

## 2018-04-17 DIAGNOSIS — E669 Obesity, unspecified: Secondary | ICD-10-CM

## 2018-04-17 DIAGNOSIS — Z7901 Long term (current) use of anticoagulants: Secondary | ICD-10-CM

## 2018-04-17 DIAGNOSIS — Z951 Presence of aortocoronary bypass graft: Secondary | ICD-10-CM

## 2018-04-17 DIAGNOSIS — I10 Essential (primary) hypertension: Secondary | ICD-10-CM

## 2018-04-17 NOTE — Patient Instructions (Signed)
Medication Instructions:  Your Physician recommend you continue on your current medication as directed.    If you need a refill on your cardiac medications before your next appointment, please call your pharmacy.   Lab work: Your physician recommends that you return for lab work today CMP, TSH)   Testing/Procedures: None  Follow-Up: At [facility], you and your health needs are our priority.  As part of our continuing mission to provide you with exceptional heart care, we have created designated Provider Care Teams.  These Care Teams include your primary Cardiologist (physician) and Advanced Practice Providers (APPs -  Physician Assistants and Nurse Practitioners) who all work together to provide you with the care you need, when you need it. You will need a follow up appointment in 6 months.  Please call our office 2 months in advance to schedule this appointment.  You may see Dr. Harrell Gave or one of the following Advanced Practice Providers on your designated Care Team:   Rosaria Ferries, PA-C . Jory Sims, DNP, ANP

## 2018-04-17 NOTE — Progress Notes (Signed)
Cardiology Office Note:    Date:  04/17/2018   ID:  KIMBLE HITCHENS, DOB Aug 15, 1932, MRN [id number]  PCP:  Burnard Bunting, MD  Cardiologist:  Buford Dresser, MD PhD (formerly Dr. Wynonia Lawman)  Referring MD: Burnard Bunting, MD   Chief Complaint  Patient presents with  . Follow-up    History of Present Illness:    Vincent Sharp is a 85 y.o. male with a hx of CAD s/p CABG, paroxysmal atrial fibrillation on anticoagulation, chronic diastolic heart failure, hypertension, obesity, hyperlipidemia, chronic venous insufficiency, sleep apnea who is seen as a new patient to me for the evaluation and management of his cardiac issues.  Saw PCP 2 weeks ago, was having fatigue, had bystolic decreased at that time from 10 mg to 5 mg. Wife has concerns about amiodarone, discussed at length options for rhythm vs. Rate control, medication options, cardioversions, etc. Discussed amiodarone monitoring. She is not interested in other rhythm control strategies, discussed pros/cons of trialing rate control only strategy.  Has chronic venous stasis changes, on furosemide. Has chronic knee pain, uses walker or cane. Limited by knee pain, not cardiac symptoms.   Has blood pressure log from home, 126/65 at home this AM. Range is 96/65-136/66. Had a fall out of the chair on 2/1, 96/65 measurement was the day after he slid out of the chair. He had a lift chair and lifts it up, then falls asleep and slides out of the chair.   Denies chest pain, shortness of breath at rest or with normal exertion. No PND, orthopnea, or unexpected weight gain. No syncope or palpitations.  Past Medical History:  Diagnosis Date  . Anxiety   . Arthritis   . CAD (coronary artery disease), native coronary artery 12/26/2013   CABG with LIMA to LAD SVG to OM, SVG to dx1 1992 Dr. Redmond Pulling Cath 2009 scattered irregularities Left main, occluded LAD, occluded CFX, 40% stenosis proximal RCA, widely patent OM 1 SVG, widely patent LAD  LIMA graft and fills first diagonal branch but with stenosis between mammary insertion site and diagonal branch, occluded Diag 1 SVG   . CHF (congestive heart failure) ([place]) 03/2016  . Colon cancer ([place])    skin, colon  . Complex sleep apnea syndrome 05/18/2013  . Coronary artery disease   . Depression   . Essential hypertension 12/26/2013  . Headache   . History of colon cancer    Treated with partial colectomy   . Hyperlipidemia   . Hypertension   . Lazy eye, left   . Obesity (BMI 30-39.9)   . Obesity hypoventilation syndrome ([place])   . OSA on CPAP   . PAF (paroxysmal atrial fibrillation) ([place])   . Pneumonia    back in 2015  . Spinal stenosis in cervical region 12/11/2012    Past Surgical History:  Procedure Laterality Date  . CARDIOVERSION N/A 04/13/2016   Procedure: CARDIOVERSION;  Surgeon: Jacolyn Reedy, MD;  Location: [facility] ENDOSCOPY;  Service: Cardiovascular;  Laterality: N/A;  . CARDIOVERSION N/A 05/17/2016   Procedure: CARDIOVERSION;  Surgeon: Jacolyn Reedy, MD;  Location: [place];  Service: Cardiovascular;  Laterality: N/A;  . CATARACT EXTRACTION Bilateral   . CORONARY ARTERY BYPASS GRAFT  1992   x3, Dr Redmond Pulling surgeon,  Dr Wynonia Lawman cardiologist  . EYE SURGERY    . HEMORRHOID SURGERY    . HERNIA REPAIR     right inguinal  . HYDROCELE EXCISION / REPAIR    . INGUINAL HERNIA REPAIR Left 10/08/2016  Procedure: LEFT INGUINAL HERNIA REPAIR WITH MESH;  Surgeon: Jackolyn Confer, MD;  Location: [place];  Service: General;  Laterality: Left;  TAP BLOCK  AND LMA  . INSERTION OF MESH Left 10/08/2016   Procedure: INSERTION OF MESH;  Surgeon: Jackolyn Confer, MD;  Location: [place];  Service: General;  Laterality: Left;  . KNEE ARTHROSCOPY W/ MENISCAL REPAIR Right   . PARTIAL COLECTOMY    . POLYPECTOMY  1989  . SHOULDER ARTHROSCOPY WITH ROTATOR CUFF REPAIR AND SUBACROMIAL DECOMPRESSION Right 12/27/2013   Procedure: RIGHT SHOULDER ARTHROSCOPY SUBACROMIAL DECOMPRESSION DISTAL  CLAVICLE RESECTION AND ROTATOR CUFF REPAIR, BISCEPS TENOTOMY;  Surgeon: Marin Shutter, MD;  Location: [place];  Service: Orthopedics;  Laterality: Right;  . TONSILLECTOMY      Current Medications: Current Outpatient Medications on File Prior to Visit  Medication Sig  . acetaminophen (TYLENOL) 650 MG CR tablet Take 650 mg by mouth every 8 (eight) hours as needed for pain.  Marland Kitchen alfuzosin (UROXATRAL) 10 MG 24 hr tablet Take 10 mg by mouth daily.  Marland Kitchen amiodarone (PACERONE) 100 MG tablet Take 1 tablet (100 mg total) by mouth daily.  Marland Kitchen apixaban (ELIQUIS) 5 MG TABS tablet Take 5 mg by mouth 2 (two) times daily.  Marland Kitchen atorvastatin (LIPITOR) 40 MG tablet Take 20-40 mg by mouth See admin instructions. Takes 20 mg daily except on Monday, Wednesday and Fridays and takes 40 mg takes 0.5 tablet in the morning and 0.5 tablet in the evening  . furosemide (LASIX) 40 MG tablet Take 1 tablet (40 mg total) by mouth daily.  . Multiple Vitamin (MULTI-VITAMIN PO) Take 1 tablet by mouth daily.   . nebivolol (BYSTOLIC) 5 MG tablet Take 5 mg by mouth daily.  . Omega-3 Fatty Acids (FISH OIL) 1000 MG CAPS Take 1,000 mg by mouth daily.   No current facility-administered medications on file prior to visit.      Allergies:   No known allergies   Social History   Socioeconomic History  . Marital status: Married    Spouse name: [name]  . Number of children: 3  . Years of education: HS  . Highest education level: Not on file  Occupational History  . Occupation: RET    Employer: [employer]    Comment: N/A    Employer: [employer]  . Financial resource strain: Not on file  . Food insecurity:    Worry: Not on file    Inability: Not on file  . Transportation needs:    Medical: Not on file    Non-medical: Not on file  Tobacco Use  . Smoking status: Never Smoker  . Smokeless tobacco: Never Used  Substance and Sexual Activity  . Alcohol use: No  . Drug use: No  . Sexual activity: Never    Lifestyle  . Physical activity:    Days per week: Not on file    Minutes per session: Not on file  . Stress: Not on file  Relationships  . Social connections:    Talks on phone: Not on file    Gets together: Not on file    Attends religious service: Not on file    Active member of club or organization: Not on file    Attends meetings of clubs or organizations: Not on file    Relationship status: Not on file  Other Topics Concern  . Not on file  Social History Narrative   Patient lives at home with his spouseWilburn Cornelia)  Patient has three children.   Married 57 yrs   Caffeine Use: 1/2 cup daily.   Patient is retired.   Patient is right-handed.     Family History: The patient's family history includes Heart disease in his mother.  ROS:   Please see the history of present illness.  Additional pertinent ROS:  Constitutional: Negative for chills, fever, night sweats, unintentional weight loss  HENT: Negative for ear pain and hearing loss.   Eyes: Negative for loss of vision and eye pain.  Respiratory: Negative for cough, sputum, shortness of breath, wheezing.   Cardiovascular: See HPI. Gastrointestinal: Negative for abdominal pain, melena, and hematochezia.  Genitourinary: Negative for dysuria and hematuria.  Musculoskeletal: Negative for falls and myalgias.  Skin: Negative for itching and rash. Has had multiple skin lesions biopsied/treated for cancer by derm. Neurological: Negative for focal weakness, focal sensory changes and loss of consciousness.  Endo/Heme/Allergies: Does not bruise/bleed easily.    EKGs/Labs/Other Studies Reviewed:    The following studies were reviewed today: Notes from Dr Wynonia Lawman: Echo 12/01/15 Severe concentric LVH with EF 50%, no rWMA Grade 2 diastolic dysfunction LAE Mild aortic valve calcification with trivial aortic stenosis Mild/moderate MR Mild/moderate TR Dilated IVC with blunted response  EKG:  EKG is personally reviewed.  The ekg  ordered today demonstrates sinus bradycardia at a rate of 53 bpm, RBBB  Recent Labs: No results found for requested labs within last 8760 hours.  Recent Lipid Panel No results found for: CHOL, TRIG, HDL, CHOLHDL, VLDL, LDLCALC, LDLDIRECT  Lipids 7.24.19 Tchol 218 HDL 34 LDL 144 TG 200  Physical Exam:    VS:  BP (!) 120/54 (BP Location: Left Arm, Patient Position: Sitting, Cuff Size: Normal)   Pulse (!) 53   Ht 6' (1.829 m)   Wt 250 lb (113.4 kg)   BMI 33.91 kg/m     Wt Readings from Last 3 Encounters:  04/17/18 250 lb (113.4 kg)  10/08/16 239 lb (108.4 kg)  10/05/16 239 lb 11.2 oz (108.7 kg)     GEN: Well nourished, well developed in no acute distress HEENT: Normal NECK: No JVD; No carotid bruits LYMPHATICS: No lymphadenopathy CARDIAC: regular rhythm, normal S1 and S2, no rubs, gallops. 2/6 SEM at [facility], early peaking. 1/6 HSM at [place]. Radial and DP pulses 2+ bilaterally. RESPIRATORY:  Clear to auscultation without rales, wheezing or rhonchi  ABDOMEN: Soft, non-tender, non-distended MUSCULOSKELETAL:  Chronic venous stasis changes with brawny edema and skin discoloration bilaterally. Circular plaque covered, raised lesion on left calf (wife reports this is being followed by derm)  SKIN: Warm and dry NEUROLOGIC:  Alert and oriented x 3 PSYCHIATRIC:  Normal affect   ASSESSMENT:    1. Paroxysmal atrial fibrillation (HCC)   2. Medication management   3. Coronary artery disease involving native coronary artery of native heart without angina pectoris   4. Chronic diastolic (congestive) heart failure ([place])   5. Essential hypertension   6. S/P CABG (coronary artery bypass graft)   7. Mixed hyperlipidemia   8. Current use of long term anticoagulation   9. Obesity (BMI 30-39.9)   10. Chronic venous insufficiency    PLAN:    Paroxysmal atrial fibrillation: on long term anticoagulation. Presently rhythm controlled. -CHA2DS2/VAS Stroke Risk Points=  5  -continue apixaban, 5  mg BID dose given weight and Cr <1.5 -continue amiodarone. Discussed alternative rhythm control strategies as well as rate control strategy. Wishes to stay with the current plan for now. -check CMP, TSH today  to monitor amiodarone. Had chest X ray within the last year -on bystolic, recently decreased from 10 mg to 5 mg daily. If BP continues to decrease, then may need to consider stopping entirely.  Chronic diastolic heart failure: appears euvolemic today (chronic venous insufficiency) -on furosemide for fluid management  Hypertension: as above, has been running on the low side. Recently cut back on his bystolic. Monitor  CAD s/p CABG: no angina -no aspirin as he is on apixaban. -on atorvastatin, is on individualized dosing -on bystolic, recently decreased dose, as above  Mixed hyperlipidemia -atorvastatin, dosing as noted -LDL not at goal, but does not tolerate higher dose statin. Given age and multiple meds, will hold on ezetimibe for now. Is on OTC fish oil as well.  Chronic venous insufficiency: brawny edema with skin changes and discoloration. Elevate legs, compression stockings if able  Obesity: BMI 33, would benefit from weight loss, but he is limited in his activity due to joint pain.  Plan for follow up: 6 mos or sooner PRN  TIME SPENT WITH PATIENT: >40 minutes of direct patient care. More than 50% of that time was spent on coordination of care and counseling regarding atrial fibrillation control strategies, risk/benefit, medication management.  Buford Dresser, MD, PhD [place]  [facility]   Medication Adjustments/Labs and Tests Ordered: Current medicines are reviewed at length with the patient today.  Concerns regarding medicines are outlined above.  Orders Placed This Encounter  Procedures  . Comprehensive metabolic panel  . TSH  . EKG 12-Lead   No orders of the defined types were placed in this encounter.   Patient Instructions  Medication  Instructions:  Your Physician recommend you continue on your current medication as directed.    If you need a refill on your cardiac medications before your next appointment, please call your pharmacy.   Lab work: Your physician recommends that you return for lab work today CMP, TSH)   Testing/Procedures: None  Follow-Up: At [facility], you and your health needs are our priority.  As part of our continuing mission to provide you with exceptional heart care, we have created designated Provider Care Teams.  These Care Teams include your primary Cardiologist (physician) and Advanced Practice Providers (APPs -  Physician Assistants and Nurse Practitioners) who all work together to provide you with the care you need, when you need it. You will need a follow up appointment in 6 months.  Please call our office 2 months in advance to schedule this appointment.  You may see Dr. Harrell Gave or one of the following Advanced Practice Providers on your designated Care Team:   Rosaria Ferries, PA-C . Jory Sims, DNP, ANP        Signed, Buford Dresser, MD PhD 04/17/2018 3:21 PM     [facility]

## 2018-04-18 ENCOUNTER — Telehealth: Payer: Self-pay | Admitting: Cardiology

## 2018-04-18 LAB — COMPREHENSIVE METABOLIC PANEL
A/G RATIO: 2 (ref 1.2–2.2)
ALT: 18 IU/L (ref 0–44)
AST: 26 IU/L (ref 0–40)
Albumin: 4.1 g/dL (ref 3.6–4.6)
Alkaline Phosphatase: 91 IU/L (ref 39–117)
BUN/Creatinine Ratio: 14 (ref 10–24)
BUN: 20 mg/dL (ref 8–27)
Bilirubin Total: 1.1 mg/dL (ref 0.0–1.2)
CALCIUM: 9.1 mg/dL (ref 8.6–10.2)
CO2: 26 mmol/L (ref 20–29)
CREATININE: 1.47 mg/dL — AB (ref 0.76–1.27)
Chloride: 102 mmol/L (ref 96–106)
GFR, EST AFRICAN AMERICAN: 50 mL/min/{1.73_m2} — AB (ref >59)
GFR, EST NON AFRICAN AMERICAN: 43 mL/min/{1.73_m2} — AB (ref >59)
GLUCOSE: 88 mg/dL (ref 65–99)
Globulin, Total: 2.1 g/dL (ref 1.5–4.5)
Potassium: 4.4 mmol/L (ref 3.5–5.2)
Sodium: 143 mmol/L (ref 134–144)
TOTAL PROTEIN: 6.2 g/dL (ref 6.0–8.5)

## 2018-04-18 LAB — TSH: TSH: 0.968 u[IU]/mL (ref 0.450–4.500)

## 2018-04-18 NOTE — Telephone Encounter (Signed)
Pt asked me to give his wife his lab results... I spoke with her and she verbalized understanding.

## 2018-04-18 NOTE — Telephone Encounter (Signed)
° ° °  Patient and his spouse calling to discuss lab results again

## 2018-05-03 ENCOUNTER — Other Ambulatory Visit: Payer: Self-pay | Admitting: Cardiology

## 2018-05-03 MED ORDER — FUROSEMIDE 40 MG PO TABS: 40.0000 mg | ORAL_TABLET | Freq: Every day | ORAL | 12 refills | Status: DC

## 2018-05-03 MED ORDER — APIXABAN 5 MG PO TABS: 5.0000 mg | ORAL_TABLET | Freq: Two times a day (BID) | ORAL | 0 refills | Status: DC

## 2018-05-03 NOTE — Telephone Encounter (Signed)
 *  STAT* If patient is at the pharmacy, call can be transferred to refill team.   1. Which medications need to be refilled? (please list name of each medication and dose if known)  apixaban (ELIQUIS) 5 MG TABS tablet furosemide (LASIX) 40 MG tablet  2. Which pharmacy/location (including street and city if local pharmacy) is medication to be sent to? [facility] as listed  3. Do they need a 30 day or 90 day supply? [facility]

## 2018-05-16 ENCOUNTER — Encounter (HOSPITAL_COMMUNITY): Payer: Self-pay

## 2018-05-16 ENCOUNTER — Emergency Department (HOSPITAL_COMMUNITY): Payer: Medicare Other

## 2018-05-16 ENCOUNTER — Other Ambulatory Visit: Payer: Self-pay

## 2018-05-16 ENCOUNTER — Emergency Department (HOSPITAL_COMMUNITY)
Admission: EM | Admit: 2018-05-16 | Discharge: 2018-05-16 | Disposition: A | Discharge status: Home / Self Care | Payer: Medicare Other | Attending: Emergency Medicine | Admitting: Emergency Medicine

## 2018-05-16 DIAGNOSIS — R404 Transient alteration of awareness: Secondary | ICD-10-CM | POA: Insufficient documentation

## 2018-05-16 DIAGNOSIS — R4182 Altered mental status, unspecified: Secondary | ICD-10-CM | POA: Diagnosis present

## 2018-05-16 DIAGNOSIS — Z79899 Other long term (current) drug therapy: Secondary | ICD-10-CM | POA: Insufficient documentation

## 2018-05-16 DIAGNOSIS — I110 Hypertensive heart disease with heart failure: Secondary | ICD-10-CM | POA: Diagnosis not present

## 2018-05-16 DIAGNOSIS — I5032 Chronic diastolic (congestive) heart failure: Secondary | ICD-10-CM | POA: Insufficient documentation

## 2018-05-16 LAB — ETHANOL: Alcohol, Ethyl (B): <10 mg/dL (ref <10)

## 2018-05-16 LAB — CBC WITH DIFFERENTIAL/PLATELET
Abs Immature Granulocytes: 0.02 10*3/uL (ref 0.00–0.07)
Basophils Absolute: 0 10*3/uL (ref 0.0–0.1)
Basophils Relative: 1 %
Eosinophils Absolute: 0 10*3/uL (ref 0.0–0.5)
Eosinophils Relative: 1 %
HCT: 40.9 % (ref 39.0–52.0)
Hemoglobin: 13.9 g/dL (ref 13.0–17.0)
Immature Granulocytes: 1 %
Lymphocytes Relative: 32 %
Lymphs Abs: 1.3 10*3/uL (ref 0.7–4.0)
MCH: 31.4 pg (ref 26.0–34.0)
MCHC: 34 g/dL (ref 30.0–36.0)
MCV: 92.3 fL (ref 80.0–100.0)
MONOS PCT: 11 %
Monocytes Absolute: 0.4 10*3/uL (ref 0.1–1.0)
Neutro Abs: 2.2 10*3/uL (ref 1.7–7.7)
Neutrophils Relative %: 54 %
Platelets: 121 10*3/uL — ABNORMAL LOW (ref 150–400)
RBC: 4.43 MIL/uL (ref 4.22–5.81)
RDW: 12.6 % (ref 11.5–15.5)
WBC: 4 10*3/uL (ref 4.0–10.5)
nRBC: 0 % (ref 0.0–0.2)

## 2018-05-16 LAB — POCT I-STAT EG7
ACID-BASE EXCESS: 3 mmol/L — AB (ref 0.0–2.0)
Bicarbonate: 27.5 mmol/L (ref 20.0–28.0)
Calcium, Ion: 1.09 mmol/L — ABNORMAL LOW (ref 1.15–1.40)
HCT: 37 % — ABNORMAL LOW (ref 39.0–52.0)
Hemoglobin: 12.6 g/dL — ABNORMAL LOW (ref 13.0–17.0)
O2 Saturation: 65 %
Potassium: 3.7 mmol/L (ref 3.5–5.1)
Sodium: 141 mmol/L (ref 135–145)
TCO2: 29 mmol/L (ref 22–32)
pCO2, Ven: 39.7 mmHg — ABNORMAL LOW (ref 44.0–60.0)
pH, Ven: 7.448 — ABNORMAL HIGH (ref 7.250–7.430)
pO2, Ven: 32 mmHg (ref 32.0–45.0)

## 2018-05-16 LAB — COMPREHENSIVE METABOLIC PANEL
ALT: 19 U/L (ref 0–44)
AST: 31 U/L (ref 15–41)
Albumin: 3.9 g/dL (ref 3.5–5.0)
Alkaline Phosphatase: 87 U/L (ref 38–126)
Anion gap: 8 (ref 5–15)
BUN: 19 mg/dL (ref 8–23)
CO2: 27 mmol/L (ref 22–32)
Calcium: 9.2 mg/dL (ref 8.9–10.3)
Chloride: 103 mmol/L (ref 98–111)
Creatinine, Ser: 1.41 mg/dL — ABNORMAL HIGH (ref 0.61–1.24)
GFR calc Af Amer: 53 mL/min — ABNORMAL LOW (ref >60)
GFR calc non Af Amer: 46 mL/min — ABNORMAL LOW (ref >60)
Glucose, Bld: 110 mg/dL — ABNORMAL HIGH (ref 70–99)
Potassium: 3.5 mmol/L (ref 3.5–5.1)
Sodium: 138 mmol/L (ref 135–145)
Total Bilirubin: 1.5 mg/dL — ABNORMAL HIGH (ref 0.3–1.2)
Total Protein: 6.6 g/dL (ref 6.5–8.1)

## 2018-05-16 LAB — I-STAT TROPONIN, ED: Troponin i, poc: 0.05 ng/mL (ref 0.00–0.08)

## 2018-05-16 LAB — CBG MONITORING, ED: Glucose-Capillary: 83 mg/dL (ref 70–99)

## 2018-05-16 LAB — AMMONIA: Ammonia: 35 umol/L (ref 9–35)

## 2018-05-16 LAB — TSH: TSH: 0.753 u[IU]/mL (ref 0.350–4.500)

## 2018-05-16 MED ORDER — SODIUM CHLORIDE 0.9 % IV BOLUS: 1000.0000 mL | Freq: Once | INTRAVENOUS | Status: DC

## 2018-05-16 NOTE — ED Notes (Signed)
Pt dtr removed IV.

## 2018-05-16 NOTE — ED Notes (Signed)
Patient verbalizes understanding of discharge instructions. Opportunity for questioning and answers were provided. Armband removed by staff, pt discharged from ED.  

## 2018-05-16 NOTE — Discharge Instructions (Signed)
Call your family doc in the morning.  Return for any worsening or concern that he is not safe at home.

## 2018-05-16 NOTE — ED Triage Notes (Signed)
Pt reports "my familys crazy, I dont know why im here, they sent me". A/O x 4, Unsure of specific date but does know its march of 2020. Does not have any current complaints or seem in distress at this time.

## 2018-05-16 NOTE — ED Provider Notes (Signed)
[facility] EMERGENCY DEPARTMENT Provider Note   CSN: [id number] Arrival date & time: 05/16/18  1741    History   Chief Complaint Chief Complaint  Patient presents with   Altered Mental Status    HPI Vincent Sharp is a 85 y.o. male.     85 yo M with a chief complaint of confusion.  This started acutely yesterday.  Family states that he has been making very off-the-wall comments and actually cursed a few times which is very unlike him.  States that he was complaining of some dysuria but denied any other symptoms other than some diffuse aches and pains that are chronic for him.  For me he is complaining mostly of bilateral knee pain which she has been seen by orthopedics for and gets injections frequently as well as pain to the left shoulder that is worse when he tries to pull up and get out of bed.  He denies fevers or chills.  Denies abdominal or flank pain.  Patient went to his urologist earlier today and had a UA that was told to is negative.  He denies recent head injury.  Has been off of his Flomax but no other medication changes.  Denies diarrhea denies vomiting.  Denies decreased oral intake.  Denies sick contacts.  The history is provided by the patient and a relative.  Altered Mental Status  Presenting symptoms: confusion   Severity:  Moderate Most recent episode:  Yesterday Episode history:  Continuous Duration:  2 days Timing:  Constant Progression:  Worsening Chronicity:  New Associated symptoms: no abdominal pain, no fever, no headaches, no palpitations, no rash and no vomiting     Past Medical History:  Diagnosis Date   Anxiety    Arthritis    CAD (coronary artery disease), native coronary artery 12/26/2013   CABG with LIMA to LAD SVG to OM, SVG to dx1 1992 Dr. Redmond Pulling Cath 2009 scattered irregularities Left main, occluded LAD, occluded CFX, 40% stenosis proximal RCA, widely patent OM 1 SVG, widely patent LAD LIMA graft and fills first  diagonal branch but with stenosis between mammary insertion site and diagonal branch, occluded Diag 1 SVG    CHF (congestive heart failure) ([place]) 03/2016   Colon cancer ([place])    skin, colon   Complex sleep apnea syndrome 05/18/2013   Coronary artery disease    Depression    Essential hypertension 12/26/2013   Headache    History of colon cancer    Treated with partial colectomy    Hyperlipidemia    Hypertension    Lazy eye, left    Obesity (BMI 30-39.9)    Obesity hypoventilation syndrome (HCC)    OSA on CPAP    PAF (paroxysmal atrial fibrillation) ([place])    Pneumonia    back in 2015   Spinal stenosis in cervical region 12/11/2012    Patient Active Problem List   Diagnosis Date Noted   Chronic venous insufficiency 04/17/2018   Chronic diastolic (congestive) heart failure ([place]) 04/11/2016   Paroxysmal atrial fibrillation (HCC)    Current use of long term anticoagulation    History of colon cancer    Obesity (BMI 30-39.9)    CAD (coronary artery disease), native coronary artery 12/26/2013   Essential hypertension 12/26/2013   Hyperlipidemia 12/26/2013   S/P CABG (coronary artery bypass graft) 12/26/2013   OSA on CPAP    Spinal stenosis in cervical region 12/11/2012    Past Surgical History:  Procedure Laterality Date  CARDIOVERSION N/A 04/13/2016   Procedure: CARDIOVERSION;  Surgeon: Jacolyn Reedy, MD;  Location: [place];  Service: Cardiovascular;  Laterality: N/A;   CARDIOVERSION N/A 05/17/2016   Procedure: CARDIOVERSION;  Surgeon: Jacolyn Reedy, MD;  Location: [place];  Service: Cardiovascular;  Laterality: N/A;   CATARACT EXTRACTION Bilateral    CORONARY ARTERY BYPASS GRAFT  1992   x3, Dr Redmond Pulling surgeon,  Dr Wynonia Lawman cardiologist   EYE SURGERY     HEMORRHOID SURGERY     HERNIA REPAIR     right inguinal   HYDROCELE EXCISION / [place] Left 10/08/2016   Procedure: LEFT INGUINAL HERNIA REPAIR  WITH MESH;  Surgeon: Jackolyn Confer, MD;  Location: [place];  Service: General;  Laterality: Left;  TAP BLOCK  AND LMA   INSERTION OF MESH Left 10/08/2016   Procedure: INSERTION OF MESH;  Surgeon: Jackolyn Confer, MD;  Location: [place];  Service: General;  Laterality: Left;   KNEE ARTHROSCOPY W/ MENISCAL REPAIR Right    PARTIAL COLECTOMY     POLYPECTOMY  1989   SHOULDER ARTHROSCOPY WITH ROTATOR CUFF REPAIR AND SUBACROMIAL DECOMPRESSION Right 12/27/2013   Procedure: RIGHT SHOULDER ARTHROSCOPY SUBACROMIAL DECOMPRESSION DISTAL CLAVICLE RESECTION AND ROTATOR CUFF REPAIR, [place];  Surgeon: Marin Shutter, MD;  Location: [place];  Service: Orthopedics;  Laterality: Right;   TONSILLECTOMY          Home Medications    Prior to Admission medications   Medication Sig Start Date End Date Taking? Authorizing Provider  acetaminophen (TYLENOL) 650 MG CR tablet Take 650 mg by mouth every 8 (eight) hours as needed for pain.   Yes [provider]  alfuzosin (UROXATRAL) 10 MG 24 hr tablet Take 10 mg by mouth daily. 01/22/16  Yes [provider]  amiodarone (PACERONE) 100 MG tablet Take 1 tablet (100 mg total) by mouth daily. 12/07/17  Yes Elouise Munroe, MD  apixaban (ELIQUIS) 5 MG TABS tablet Take 1 tablet (5 mg total) by mouth 2 (two) times daily. 05/03/18  Yes Buford Dresser, MD  atorvastatin (LIPITOR) 40 MG tablet Take 20-40 mg by mouth See admin instructions. Takes 20 mg daily except on Monday, Wednesday and Fridays and takes 40 mg takes 0.5 tablet in the morning and 0.5 tablet in the evening   Yes [provider]  furosemide (LASIX) 40 MG tablet Take 1 tablet (40 mg total) by mouth daily. 05/03/18  Yes Buford Dresser, MD  Multiple Vitamin (MULTI-VITAMIN PO) Take 1 tablet by mouth daily.    Yes [provider]  nebivolol (BYSTOLIC) 5 MG tablet Take 5 mg by mouth daily.   Yes [provider]  Omega-3 Fatty Acids (FISH OIL) 1000 MG  CAPS Take 1,000 mg by mouth daily.   Yes [provider]    Family History Family History  Problem Relation Age of Onset   Heart disease Mother     Social History Social History   Tobacco Use   Smoking status: Never Smoker   Smokeless tobacco: Never Used  Substance Use Topics   Alcohol use: No   Drug use: No     Allergies   Patient has no known allergies.   Review of Systems Review of Systems  Constitutional: Negative for chills and fever.  HENT: Positive for congestion. Negative for facial swelling.   Eyes: Negative for discharge and visual disturbance.  Respiratory: Negative for shortness of breath.   Cardiovascular: Negative for chest pain and  palpitations.  Gastrointestinal: Negative for abdominal pain, diarrhea and vomiting.  Musculoskeletal: Negative for arthralgias and myalgias.  Skin: Negative for color change and rash.  Neurological: Negative for tremors, syncope and headaches.  Psychiatric/Behavioral: Positive for confusion. Negative for dysphoric mood.     Physical Exam Updated Vital Signs BP 126/67    Pulse (!) 46    Resp 13    Ht 5\' 10"  (1.778 m)    Wt 113 kg    SpO2 95%    BMI 35.74 kg/m   Physical Exam Vitals signs and nursing note reviewed.  Constitutional:      Appearance: He is well-developed.  HENT:     Head: Normocephalic and atraumatic.  Eyes:     Pupils: Pupils are equal, round, and reactive to light.  Neck:     Musculoskeletal: Normal range of motion and neck supple.     Vascular: No JVD.  Cardiovascular:     Rate and Rhythm: Normal rate and regular rhythm.     Heart sounds: No murmur. No friction rub. No gallop.   Pulmonary:     Effort: No respiratory distress.     Breath sounds: No wheezing.  Abdominal:     General: There is no distension.     Tenderness: There is no guarding or rebound.  Musculoskeletal: Normal range of motion.  Skin:    Coloration: Skin is not pale.     Findings: No rash.  Neurological:      Mental Status: He is alert and oriented to person, place, and time.     Comments: Left-sided lid lag, disconjugate gaze.  Both chronic per family.  Otherwise patient confused when trying to follow commands for the neuro exam but able to complete without discrepancy.  Psychiatric:        Behavior: Behavior normal.      ED Treatments / Results  Labs (all labs ordered are listed, but only abnormal results are displayed) Labs Reviewed  COMPREHENSIVE METABOLIC PANEL - Abnormal; Notable for the following components:      Result Value   Glucose, Bld 110 (*)    Creatinine, Ser 1.41 (*)    Total Bilirubin 1.5 (*)    GFR calc non Af Amer 46 (*)    GFR calc Af Amer 53 (*)    All other components within normal limits  CBC WITH DIFFERENTIAL/PLATELET - Abnormal; Notable for the following components:   Platelets 121 (*)    All other components within normal limits  POCT I-STAT EG7 - Abnormal; Notable for the following components:   pH, Ven 7.448 (*)    pCO2, Ven 39.7 (*)    Acid-Base Excess 3.0 (*)    Calcium, Ion 1.09 (*)    HCT 37.0 (*)    Hemoglobin 12.6 (*)    All other components within normal limits  URINE CULTURE  AMMONIA  ETHANOL  TSH  URINALYSIS, ROUTINE W REFLEX MICROSCOPIC  BLOOD GAS, VENOUS  CBG MONITORING, ED  I-STAT VENOUS BLOOD GAS, ED  I-STAT TROPONIN, ED    EKG EKG Interpretation  Date/Time:  Tuesday May 16 2018 17:57:02 EDT Ventricular Rate:  52 PR Interval:    QRS Duration: 182 QT Interval:  533 QTC Calculation: 496 R Axis:   -62 Text Interpretation:  Sinus rhythm Ventricular premature complex Short PR interval RBBB and LAFB No significant change since last tracing Confirmed by Deno Etienne [phone]) on 05/16/2018 6:26:04 PM   Radiology Dg Chest 2 View  Result Date: 05/16/2018 CLINICAL DATA:  Altered level of consciousness. EXAM: CHEST - 2 VIEW COMPARISON:  09/29/2017 FINDINGS: Post median sternotomy. The heart is enlarged but unchanged from prior exam.  Unchanged mediastinal contours with aortic atherosclerosis. No pulmonary edema. No focal airspace disease, pleural effusion, or pneumothorax. Mild hyperinflation is unchanged. No acute osseous abnormalities. IMPRESSION: 1. No acute chest findings. 2. Stable cardiomegaly and Aortic Atherosclerosis (ICD10-I70.0). Electronically Signed   By: Keith Rake M.D.   On: 05/16/2018 19:49   Ct Head Wo Contrast  Result Date: 05/16/2018 CLINICAL DATA:  Altered state of consciousness. EXAM: CT HEAD WITHOUT CONTRAST TECHNIQUE: Contiguous axial images were obtained from the base of the skull through the vertex without intravenous contrast. COMPARISON:  Brain MRI 08/27/2014 FINDINGS: Brain: No evidence of acute infarction, hemorrhage, hydrocephalus, extra-axial collection or mass lesion/mass effect. Mild brain parenchymal volume loss Vascular: Calcific atherosclerotic disease at the skull base. Skull: Normal. Negative for fracture or focal lesion. Sinuses/Orbits: No acute finding. Other: None. IMPRESSION: 1. No acute intracranial abnormality. 2. Mild brain parenchymal volume loss. Electronically Signed   By: Fidela Salisbury M.D.   On: 05/16/2018 19:44    Procedures Procedures (including critical care time)  Medications Ordered in ED Medications  sodium chloride 0.9 % bolus 1,000 mL (has no administration in time range)     Initial Impression / Assessment and Plan / ED Course  I have reviewed the triage vital signs and the nursing notes.  Pertinent labs & imaging results that were available during my care of the patient were reviewed by me and considered in my medical decision making (see chart for details).        85 yo M with a cc of AMS.  Going on since yesterday.  Dysuria, but had a UA in urology office told to be negative. Will obtain AMS workup, ct head, lab eval, give bolus of fluids, reassess.   No significant change of the patient's mental status.  CT of the head without acute finding.   Blood gas without hypercarbia, ammonia is normal TSH is normal troponin negative.  Chest x-ray reviewed by me without focal infiltrate.  No significant electrolyte abnormality.  I discussed the results with the patient and family.  He apparently has significant sundowning whenever he has been in the hospital previously and if there is no acute finding on the evaluation today the family would prefer to take him home.  They feel that they are able to take care of him safely between his wife and his 3 daughters.  I suggested that they call their family doctor in the morning.  They agreed to return for any worsening or changing symptoms.  10:01 PM:  I have discussed the diagnosis/risks/treatment options with the patient and family and believe the pt to be eligible for discharge home to follow-up with PCP. We also discussed returning to the ED immediately if new or worsening sx occur. We discussed the sx which are most concerning (e.g., sudden worsening pain, fever, inability to tolerate by mouth) that necessitate immediate return. Medications administered to the patient during their visit and any new prescriptions provided to the patient are listed below.  Medications given during this visit Medications  sodium chloride 0.9 % bolus 1,000 mL (has no administration in time range)     The patient appears reasonably screen and/or stabilized for discharge and I doubt any other medical condition or other [facility] requiring further screening, evaluation, or treatment in the ED at this time prior to discharge.    Final  Clinical Impressions(s) / ED Diagnoses   Final diagnoses:  Transient alteration of awareness    ED Discharge Orders    None       Deno Etienne, DO 05/16/18 2201

## 2018-05-16 NOTE — ED Notes (Signed)
Patient transported to CT 

## 2018-05-16 NOTE — ED Notes (Signed)
Drue Dun (daughter) would like to be called with updates. [phone].

## 2018-05-17 ENCOUNTER — Emergency Department (HOSPITAL_COMMUNITY): Payer: Medicare Other

## 2018-05-17 ENCOUNTER — Other Ambulatory Visit: Payer: Self-pay

## 2018-05-17 ENCOUNTER — Emergency Department (HOSPITAL_COMMUNITY)
Admission: EM | Admit: 2018-05-17 | Discharge: 2018-05-17 | Disposition: A | Discharge status: Home / Self Care | Payer: Medicare Other | Attending: Emergency Medicine | Admitting: Emergency Medicine

## 2018-05-17 ENCOUNTER — Encounter (HOSPITAL_COMMUNITY): Payer: Self-pay

## 2018-05-17 DIAGNOSIS — I5032 Chronic diastolic (congestive) heart failure: Secondary | ICD-10-CM | POA: Diagnosis not present

## 2018-05-17 DIAGNOSIS — R55 Syncope and collapse: Secondary | ICD-10-CM | POA: Diagnosis not present

## 2018-05-17 DIAGNOSIS — Z79899 Other long term (current) drug therapy: Secondary | ICD-10-CM | POA: Diagnosis not present

## 2018-05-17 DIAGNOSIS — I110 Hypertensive heart disease with heart failure: Secondary | ICD-10-CM | POA: Diagnosis not present

## 2018-05-17 DIAGNOSIS — I951 Orthostatic hypotension: Secondary | ICD-10-CM

## 2018-05-17 LAB — I-STAT TROPONIN, ED
Troponin i, poc: 0.05 ng/mL (ref 0.00–0.08)
Troponin i, poc: 0.05 ng/mL (ref 0.00–0.08)

## 2018-05-17 LAB — CBC WITH DIFFERENTIAL/PLATELET
Abs Immature Granulocytes: 0.01 10*3/uL (ref 0.00–0.07)
Basophils Absolute: 0 10*3/uL (ref 0.0–0.1)
Basophils Relative: 1 %
EOS PCT: 1 %
Eosinophils Absolute: 0 10*3/uL (ref 0.0–0.5)
HCT: 39.3 % (ref 39.0–52.0)
Hemoglobin: 13.3 g/dL (ref 13.0–17.0)
Immature Granulocytes: 0 %
Lymphocytes Relative: 29 %
Lymphs Abs: 1.1 10*3/uL (ref 0.7–4.0)
MCH: 30.9 pg (ref 26.0–34.0)
MCHC: 33.8 g/dL (ref 30.0–36.0)
MCV: 91.4 fL (ref 80.0–100.0)
MONOS PCT: 10 %
Monocytes Absolute: 0.4 10*3/uL (ref 0.1–1.0)
Neutro Abs: 2.3 10*3/uL (ref 1.7–7.7)
Neutrophils Relative %: 59 %
Platelets: 106 10*3/uL — ABNORMAL LOW (ref 150–400)
RBC: 4.3 MIL/uL (ref 4.22–5.81)
RDW: 12.7 % (ref 11.5–15.5)
WBC: 3.8 10*3/uL — ABNORMAL LOW (ref 4.0–10.5)
nRBC: 0 % (ref 0.0–0.2)

## 2018-05-17 MED ORDER — SODIUM CHLORIDE 0.9 % IV BOLUS
500.0000 mL | Freq: Once | INTRAVENOUS | Status: AC
  Administered 2018-05-17: 500 mL via INTRAVENOUS

## 2018-05-17 NOTE — ED Provider Notes (Signed)
[facility] EMERGENCY DEPARTMENT Provider Note   CSN: [id number] Arrival date & time: 05/17/18  1734    History   Chief Complaint Chief Complaint  Patient presents with   Hypotension    HPI Vincent Sharp is a 85 y.o. male.     Patient is an 85 year old male with a history of coronary artery disease status post CABG, CHF, hypertension, obesity, paroxysmal atrial fibrillation on Eliquis who is presenting today by EMS after having a near syncopal event at home.  Patient states that throughout the day he seemed to feel fine but he has been having some word recall problems and some memory issues.  He states he was actually seen in the ER yesterday for that and it was cleared and went home.  Today he was feeling his normal self and he was eating steak at dinner time sitting at the table when he suddenly started noticing his vision going blurry and he had pain in the back of his neck and family also reported he stated he had chest pain and he became extremely pale and they lowered him to the floor.  Family did not think he ever fully lost consciousness but patient is not sure.  He states now he has no shortness of breath, chest pain or neck pain.  He has no abdominal pain nausea or vomiting.  He states he has been eating and drinking today but did not have any lunch.  Family states he ran out of his alfuzosin 3 weeks ago and just started back on it today.  He has not had fever, cough or congestion.  When patient was in the emergency room last night he had a CT of his head which showed no acute findings, TSH, ammonia, CBC, CMP, troponin, EKG and had a UA yesterday in the urology office that was all negative.  Patient denies a history of syncope.  The history is provided by the patient and a relative.    Past Medical History:  Diagnosis Date   Anxiety    Arthritis    CAD (coronary artery disease), native coronary artery 12/26/2013   CABG with LIMA to LAD SVG to OM, SVG  to dx1 1992 Dr. Redmond Pulling Cath 2009 scattered irregularities Left main, occluded LAD, occluded CFX, 40% stenosis proximal RCA, widely patent OM 1 SVG, widely patent LAD LIMA graft and fills first diagonal branch but with stenosis between mammary insertion site and diagonal branch, occluded Diag 1 SVG    CHF (congestive heart failure) ([place]) 03/2016   Colon cancer ([place])    skin, colon   Complex sleep apnea syndrome 05/18/2013   Coronary artery disease    Depression    Essential hypertension 12/26/2013   Headache    History of colon cancer    Treated with partial colectomy    Hyperlipidemia    Hypertension    Lazy eye, left    Obesity (BMI 30-39.9)    Obesity hypoventilation syndrome (HCC)    OSA on CPAP    PAF (paroxysmal atrial fibrillation) ([place])    Pneumonia    back in 2015   Spinal stenosis in cervical region 12/11/2012    Patient Active Problem List   Diagnosis Date Noted   Chronic venous insufficiency 04/17/2018   Chronic diastolic (congestive) heart failure ([place]) 04/11/2016   Paroxysmal atrial fibrillation (HCC)    Current use of long term anticoagulation    History of colon cancer    Obesity (BMI 30-39.9)    CAD (  coronary artery disease), native coronary artery 12/26/2013   Essential hypertension 12/26/2013   Hyperlipidemia 12/26/2013   S/P CABG (coronary artery bypass graft) 12/26/2013   OSA on CPAP    Spinal stenosis in cervical region 12/11/2012    Past Surgical History:  Procedure Laterality Date   CARDIOVERSION N/A 04/13/2016   Procedure: CARDIOVERSION;  Surgeon: Jacolyn Reedy, MD;  Location: [place];  Service: Cardiovascular;  Laterality: N/A;   CARDIOVERSION N/A 05/17/2016   Procedure: CARDIOVERSION;  Surgeon: Jacolyn Reedy, MD;  Location: [place];  Service: Cardiovascular;  Laterality: N/A;   CATARACT EXTRACTION Bilateral    CORONARY ARTERY BYPASS GRAFT  1992   x3, Dr Redmond Pulling surgeon,  Dr Wynonia Lawman cardiologist   EYE  SURGERY     HEMORRHOID SURGERY     HERNIA REPAIR     right inguinal   HYDROCELE EXCISION / [place] Left 10/08/2016   Procedure: LEFT INGUINAL HERNIA REPAIR WITH MESH;  Surgeon: Jackolyn Confer, MD;  Location: [place];  Service: General;  Laterality: Left;  TAP BLOCK  AND LMA   INSERTION OF MESH Left 10/08/2016   Procedure: INSERTION OF MESH;  Surgeon: Jackolyn Confer, MD;  Location: [place];  Service: General;  Laterality: Left;   KNEE ARTHROSCOPY W/ MENISCAL REPAIR Right    PARTIAL COLECTOMY     POLYPECTOMY  1989   SHOULDER ARTHROSCOPY WITH ROTATOR CUFF REPAIR AND SUBACROMIAL DECOMPRESSION Right 12/27/2013   Procedure: RIGHT SHOULDER ARTHROSCOPY SUBACROMIAL DECOMPRESSION DISTAL CLAVICLE RESECTION AND ROTATOR CUFF REPAIR, [place];  Surgeon: Marin Shutter, MD;  Location: [place];  Service: Orthopedics;  Laterality: Right;   TONSILLECTOMY          Home Medications    Prior to Admission medications   Medication Sig Start Date End Date Taking? Authorizing Provider  acetaminophen (TYLENOL) 650 MG CR tablet Take 650 mg by mouth every 8 (eight) hours as needed for pain.    [provider]  alfuzosin (UROXATRAL) 10 MG 24 hr tablet Take 10 mg by mouth daily. 01/22/16   [provider]  amiodarone (PACERONE) 100 MG tablet Take 1 tablet (100 mg total) by mouth daily. 12/07/17   Elouise Munroe, MD  apixaban (ELIQUIS) 5 MG TABS tablet Take 1 tablet (5 mg total) by mouth 2 (two) times daily. 05/03/18   Buford Dresser, MD  atorvastatin (LIPITOR) 40 MG tablet Take 20-40 mg by mouth See admin instructions. Takes 20 mg daily except on Monday, Wednesday and Fridays and takes 40 mg takes 0.5 tablet in the morning and 0.5 tablet in the evening    [provider]  furosemide (LASIX) 40 MG tablet Take 1 tablet (40 mg total) by mouth daily. 05/03/18   Buford Dresser, MD  Multiple Vitamin (MULTI-VITAMIN PO) Take 1 tablet by  mouth daily.     [provider]  nebivolol (BYSTOLIC) 5 MG tablet Take 5 mg by mouth daily.    [provider]  Omega-3 Fatty Acids (FISH OIL) 1000 MG CAPS Take 1,000 mg by mouth daily.    [provider]    Family History Family History  Problem Relation Age of Onset   Heart disease Mother     Social History Social History   Tobacco Use   Smoking status: Never Smoker   Smokeless tobacco: Never Used  Substance Use Topics   Alcohol use: No   Drug use: No     Allergies   Patient has  no known allergies.   Review of Systems Review of Systems  All other systems reviewed and are negative.    Physical Exam Updated Vital Signs BP (!) 91/55 (BP Location: Right Arm)    Pulse (!) 55    Temp 98 F (36.7 C) (Oral)    Resp 16    Ht 6' (1.829 m)    Wt 113.4 kg    SpO2 95%    BMI 33.91 kg/m   Physical Exam Vitals signs and nursing note reviewed.  Constitutional:      General: He is not in acute distress.    Appearance: He is well-developed. He is obese.  HENT:     Head: Normocephalic and atraumatic.  Eyes:     Conjunctiva/sclera: Conjunctivae normal.     Pupils: Pupils are equal, round, and reactive to light.  Neck:     Musculoskeletal: Normal range of motion and neck supple.  Cardiovascular:     Rate and Rhythm: Normal rate and regular rhythm.     Heart sounds: No murmur.  Pulmonary:     Effort: Pulmonary effort is normal. No respiratory distress.     Breath sounds: Normal breath sounds. No wheezing or rales.  Abdominal:     General: There is no distension.     Palpations: Abdomen is soft.     Tenderness: There is no abdominal tenderness. There is no guarding or rebound.  Musculoskeletal: Normal range of motion.        General: No tenderness.     Right lower leg: Edema present.     Left lower leg: Edema present.     Comments: 1+ bilateral pitting edema.  Skin changes on lower legs consistent with chronic venous stasis  Skin:     General: Skin is warm and dry.     Findings: No erythema or rash.  Neurological:     Mental Status: He is alert and oriented to person, place, and time.     Cranial Nerves: No cranial nerve deficit.     Sensory: No sensory deficit.     Motor: No weakness.     Coordination: Coordination normal.     Comments: Word finding difficulty with mild expressive aphasia  Psychiatric:        Mood and Affect: Mood normal.        Behavior: Behavior normal.        Thought Content: Thought content normal.      ED Treatments / Results  Labs (all labs ordered are listed, but only abnormal results are displayed) Labs Reviewed  CBC WITH DIFFERENTIAL/PLATELET - Abnormal; Notable for the following components:      Result Value   WBC 3.8 (*)    Platelets 106 (*)    All other components within normal limits  I-STAT TROPONIN, ED  I-STAT TROPONIN, ED    EKG EKG Interpretation  Date/Time:  Wednesday May 17 2018 17:45:40 EDT Ventricular Rate:  54 PR Interval:    QRS Duration: 181 QT Interval:  548 QTC Calculation: 520 R Axis:   -59 Text Interpretation:  Junctional rhythm Right bundle branch block No significant change since last tracing Confirmed by Blanchie Dessert [phone]) on 05/17/2018 5:54:26 PM   Radiology Dg Chest 2 View  Result Date: 05/16/2018 CLINICAL DATA:  Altered level of consciousness. EXAM: CHEST - 2 VIEW COMPARISON:  09/29/2017 FINDINGS: Post median sternotomy. The heart is enlarged but unchanged from prior exam. Unchanged mediastinal contours with aortic atherosclerosis. No pulmonary edema. No focal airspace disease,  pleural effusion, or pneumothorax. Mild hyperinflation is unchanged. No acute osseous abnormalities. IMPRESSION: 1. No acute chest findings. 2. Stable cardiomegaly and Aortic Atherosclerosis (ICD10-I70.0). Electronically Signed   By: Keith Rake M.D.   On: 05/16/2018 19:49   Ct Head Wo Contrast  Result Date: 05/16/2018 CLINICAL DATA:  Altered state of  consciousness. EXAM: CT HEAD WITHOUT CONTRAST TECHNIQUE: Contiguous axial images were obtained from the base of the skull through the vertex without intravenous contrast. COMPARISON:  Brain MRI 08/27/2014 FINDINGS: Brain: No evidence of acute infarction, hemorrhage, hydrocephalus, extra-axial collection or mass lesion/mass effect. Mild brain parenchymal volume loss Vascular: Calcific atherosclerotic disease at the skull base. Skull: Normal. Negative for fracture or focal lesion. Sinuses/Orbits: No acute finding. Other: None. IMPRESSION: 1. No acute intracranial abnormality. 2. Mild brain parenchymal volume loss. Electronically Signed   By: Fidela Salisbury M.D.   On: 05/16/2018 19:44    Procedures Procedures (including critical care time)  Medications Ordered in ED Medications  sodium chloride 0.9 % bolus 500 mL (500 mLs Intravenous New Bag/Given 05/17/18 1832)     Initial Impression / Assessment and Plan / ED Course  I have reviewed the triage vital signs and the nursing notes.  Pertinent labs & imaging results that were available during my care of the patient were reviewed by me and considered in my medical decision making (see chart for details).       Elderly male returning to the emergency room today for a near syncopal event.  Patient is found to be mildly hypotensive here with blood pressure of 90/50 which is different from yesterday.  Patient did start back on his alfuzosin which he has been out of for 3 weeks.  This could be the cause of his low blood pressure and his symptoms today.  However patient is also having word finding difficulties and was seen in the ER yesterday for this.  Patient has recently been diagnosed with dementia and was diagnosed with some delirium.  Patient is in no acute distress at this time.  He did complain of chest pain and neck pain prior to the syncope which could have been demand related for low blood pressure versus the cause of his near syncope.   Patient's EKG is no different today.  Initial troponin is within normal limits but will do delta troponin.  Patient had full work-up yesterday with normal labs including ammonia, TSH, troponin, CBC, CMP.  A lot of these labs were not repeated today but a CBC was repeated with no change in hemoglobin.  Head CT from yesterday showed no sign of bleed but will do an MRI today to ensure no acute strokes.  10:24 PM MRI is negative.  Delta troponin is within normal limits.  Patient's blood pressure has improved after 500 mL's of fluid.  Patient was able to ambulate here without any sense of dizziness or feeling lightheaded.  Suspect patient's medication was the cause of his symptoms today.  Low suspicion that this is PE or dissection.  Patient does have terrible sleep apnea which was evident while he was sleeping here.  Daughter states he is tried multiple mask and does not like the fit of any of them.  Some of his delirium may be related to poor sleep in addition to underlying dementia.  He is going to follow-up with his PCP about trying another mask to help with his sleep apnea.  Also there again to discuss whether he needs any adjustments of his blood pressure medication  and he is can hold taking the Afluzosin.  Final Clinical Impressions(s) / ED Diagnoses   Final diagnoses:  Orthostatic hypotension  Near syncope    ED Discharge Orders    None       Blanchie Dessert, MD 05/17/18 2226

## 2018-05-17 NOTE — ED Notes (Signed)
Pt ambulated through the halls with a one person asst. Pt. Gait was good and O2 saturations were 90-93%. Upon laying back down sats went back up to 96% with no O2 connection. No SOB or dizziness stated by the pt.

## 2018-05-17 NOTE — ED Notes (Signed)
Pt's daughter Darrold Span @ [phone]. Okay per Dr. Maryan Rued for 1 family member to stay with pt tonight due to confusion/dementia.

## 2018-05-17 NOTE — ED Notes (Signed)
ED Provider at bedside. 

## 2018-05-17 NOTE — ED Triage Notes (Signed)
Pt arrives with [facility] c/o hypotension. Pt was in the ED last night due to altered mental status and was diagnosed with delirium. Per EMS, pt was recently diagnosed with dementia. Per EMS, upon arrival, pt was pale, diaphoretic, hypotensive, and had an altered mental status. Pt's HR was in the 50's per EMS, BP 100/60. Given 250 ml fluid bolus en route by EMS, and took 324 mg ASA at home prior to arrival. Pt's daughter states her father has started taking "alfusion" and believes his sxs are from this medication.

## 2018-05-17 NOTE — Discharge Instructions (Signed)
Hold the afluzosin as it may have caused her blood pressure to be low tonight and caused her symptoms of nearly passing out.  Call your doctor tomorrow because it will be important for you to follow-up also for your sleep apnea which may be causing some of the issues you have during the day.  If you develop chest pain, shortness of breath, fever, vomiting or other concerns please return to the emergency room.

## 2018-05-17 NOTE — ED Notes (Signed)
Placed pt on 2 lpm as he was desatting when sleeping with mouth open.

## 2018-07-27 ENCOUNTER — Other Ambulatory Visit: Payer: Self-pay | Admitting: Cardiology

## 2018-08-31 ENCOUNTER — Other Ambulatory Visit: Payer: Self-pay | Admitting: Cardiology

## 2018-10-12 ENCOUNTER — Telehealth: Payer: Self-pay | Admitting: Cardiology

## 2018-10-12 NOTE — Telephone Encounter (Signed)
  Pt c/o medication issue:  1. Name of Medication: amiodarone (PACERONE) 100 MG tablet and Donepezil 10 mg  2. How are you currently taking this medication (dosage and times per day)? As directed   3. Are you having a reaction (difficulty breathing--STAT)?  NA  4. What is your medication issue?  PCP had placed patient on Donepezil back in March. Patient had some shaking in his hands possibly from the amiodarone. But now that he is taking Donepezil they are concerned about possible drug interaction. They want to know if it is safe to take the two together and if Dr Harrell Gave is okay with him taking the Donepezil.

## 2018-10-12 NOTE — Telephone Encounter (Signed)
He should be fine to continue with both medications.  The interaction has to do with prolonging QTc, but his has been essentially unchanged in the past few years.

## 2018-10-12 NOTE — Telephone Encounter (Signed)
Returned call to pt wife [place]. Informed her that message routed to pharmD to advise and pharmD reviewed chart and stated that pt should be fine to continue both donepezil and amiodarone. She verbalized understanding. Pt wife verified dosage and frequency. Medication donepezil 10mg  QHS added to med list

## 2018-10-23 ENCOUNTER — Telehealth: Payer: Self-pay | Admitting: Cardiology

## 2018-10-23 NOTE — Telephone Encounter (Signed)
  Spoke wife and informed that she is ok to come with pt to appointment. Wife advised for both to wear a mask. Wife voiced understanding.   COVID-19 Pre-Screening Questions:  . In the past 7 to 10 days have you had a cough,  shortness of breath, headache, congestion, fever (100 or greater) body aches, chills, sore throat, or sudden loss of taste or sense of smell? NO . Have you been around anyone with known Covid 19. No . Have you been around anyone who is awaiting Covid 19 test results in the past 7 to 10 days? No . Have you been around anyone who has been exposed to Covid 19, or has mentioned symptoms of Covid 19 within the past 7 to 10 days? No  If you have any concerns/questions about symptoms patients report during screening (either on the phone or at threshold). Contact the provider seeing the patient or DOD for further guidance.  If neither are available contact a member of the leadership team.

## 2018-10-23 NOTE — Telephone Encounter (Signed)
New Message:   Wife called and said that she or her daughter will have to come with pt tomorrow.  Pt has Dementia.

## 2018-10-24 ENCOUNTER — Other Ambulatory Visit: Payer: Self-pay

## 2018-10-24 ENCOUNTER — Encounter: Payer: Self-pay | Admitting: Cardiology

## 2018-10-24 ENCOUNTER — Ambulatory Visit (INDEPENDENT_AMBULATORY_CARE_PROVIDER_SITE_OTHER): Payer: Medicare Other | Admitting: Cardiology

## 2018-10-24 VITALS — BP 106/58 | HR 51 | Ht 72.0 in | Wt 247.4 lb

## 2018-10-24 DIAGNOSIS — I5032 Chronic diastolic (congestive) heart failure: Secondary | ICD-10-CM

## 2018-10-24 DIAGNOSIS — I872 Venous insufficiency (chronic) (peripheral): Secondary | ICD-10-CM

## 2018-10-24 DIAGNOSIS — I2510 Atherosclerotic heart disease of native coronary artery without angina pectoris: Secondary | ICD-10-CM

## 2018-10-24 DIAGNOSIS — I10 Essential (primary) hypertension: Secondary | ICD-10-CM | POA: Diagnosis not present

## 2018-10-24 DIAGNOSIS — I480 Paroxysmal atrial fibrillation: Secondary | ICD-10-CM

## 2018-10-24 NOTE — Patient Instructions (Signed)
Medication Instructions:  Your Physician recommend you continue on your current medication as directed.    If you need a refill on your cardiac medications before your next appointment, please call your pharmacy.   Lab work: None  Testing/Procedures: None  Follow-Up: At [facility], you and your health needs are our priority.  As part of our continuing mission to provide you with exceptional heart care, we have created designated Provider Care Teams.  These Care Teams include your primary Cardiologist (physician) and Advanced Practice Providers (APPs -  Physician Assistants and Nurse Practitioners) who all work together to provide you with the care you need, when you need it. You will need a follow up appointment in 6 months.  Please call our office 2 months in advance to schedule this appointment.  You may see Dr. Christopher or one of the following Advanced Practice Providers on your designated Care Team:   Rhonda Barrett, PA-C . Kathryn Lawrence, DNP, ANP     

## 2018-10-24 NOTE — Progress Notes (Signed)
Cardiology Office Note:    Date:  10/24/2018   ID:  Vincent Sharp, DOB December 03, 1932, MRN [id number]  PCP:  Burnard Bunting, MD  Cardiologist:  Buford Dresser, MD PhD (formerly Dr. Wynonia Lawman)  Referring MD: Burnard Bunting, MD   CC: follow up  History of Present Illness:    Vincent Sharp is a 85 y.o. male with a hx of CAD s/p CABG, paroxysmal atrial fibrillation on anticoagulation, chronic diastolic heart failure, hypertension, obesity, hyperlipidemia, chronic venous insufficiency, sleep apnea who is seen for follow up. I initially met him on 04/17/18.  Today: Brings labs from recent PCP visit at [facility]. From 10/11/18. Normal kidney/liver function, normal CBC, normal TSH. Lipids Tchol 197, TG 159, HDL 34, LDL 131.  Daughter Marita [place] with him today. Notes that he was sitting in the chair, fell asleep and fell on his face. Wife is on speakerphone today. Only concern is jerking in arms, being treated by Dr. Reynaldo Minium.   Discussed increasing activity, specifically walking, as long as safe from an ambulation standpoint (walker, cane etc). Has upcoming injections for his knee arthritis. Discussed venous insufficiency, chronic skin changes, elevation, compression.  Sleeps a lot during the day. Minimal physical activity, rarely leaves the house due to coronavirus.  Blood pressure at home has been good per report. No log with him today. A bit low today but denies symptoms.   Reviewed medications today. No significant bleeding, does bruise easily. No recent palpitations or sensations of afib.  Denies chest pain, shortness of breath at rest or with normal exertion. No PND, orthopnea, LE edema or unexpected weight gain. No syncope or palpitations.  Past Medical History:  Diagnosis Date   Anxiety    Arthritis    CAD (coronary artery disease), native coronary artery 12/26/2013   CABG with LIMA to LAD SVG to OM, SVG to dx1 1992 Dr. Redmond Pulling Cath 2009 scattered  irregularities Left main, occluded LAD, occluded CFX, 40% stenosis proximal RCA, widely patent OM 1 SVG, widely patent LAD LIMA graft and fills first diagonal branch but with stenosis between mammary insertion site and diagonal branch, occluded Diag 1 SVG    CHF (congestive heart failure) ([place]) 03/2016   Colon cancer ([place])    skin, colon   Complex sleep apnea syndrome 05/18/2013   Coronary artery disease    Depression    Essential hypertension 12/26/2013   Headache    History of colon cancer    Treated with partial colectomy    Hyperlipidemia    Hypertension    Lazy eye, left    Obesity (BMI 30-39.9)    Obesity hypoventilation syndrome (HCC)    OSA on CPAP    PAF (paroxysmal atrial fibrillation) ([place])    Pneumonia    back in 2015   Spinal stenosis in cervical region 12/11/2012    Past Surgical History:  Procedure Laterality Date   CARDIOVERSION N/A 04/13/2016   Procedure: CARDIOVERSION;  Surgeon: Jacolyn Reedy, MD;  Location: [facility];  Service: Cardiovascular;  Laterality: N/A;   CARDIOVERSION N/A 05/17/2016   Procedure: CARDIOVERSION;  Surgeon: Jacolyn Reedy, MD;  Location: [facility];  Service: Cardiovascular;  Laterality: N/A;   CATARACT EXTRACTION Bilateral    CORONARY ARTERY BYPASS GRAFT  1992   x3, Dr Redmond Pulling surgeon,  Dr Wynonia Lawman cardiologist   EYE SURGERY     HEMORRHOID SURGERY     HERNIA REPAIR     right inguinal   HYDROCELE EXCISION / REPAIR  INGUINAL HERNIA REPAIR Left 10/08/2016   Procedure: LEFT INGUINAL HERNIA REPAIR WITH MESH;  Surgeon: Jackolyn Confer, MD;  Location: [facility];  Service: General;  Laterality: Left;  TAP BLOCK  AND LMA   INSERTION OF MESH Left 10/08/2016   Procedure: INSERTION OF MESH;  Surgeon: Jackolyn Confer, MD;  Location: [facility];  Service: General;  Laterality: Left;   KNEE ARTHROSCOPY W/ MENISCAL REPAIR Right    PARTIAL COLECTOMY     POLYPECTOMY  1989   SHOULDER ARTHROSCOPY WITH ROTATOR CUFF REPAIR  AND SUBACROMIAL DECOMPRESSION Right 12/27/2013   Procedure: RIGHT SHOULDER ARTHROSCOPY SUBACROMIAL DECOMPRESSION DISTAL CLAVICLE RESECTION AND ROTATOR CUFF REPAIR, [place];  Surgeon: Marin Shutter, MD;  Location: [facility];  Service: Orthopedics;  Laterality: Right;   TONSILLECTOMY      Current Medications: Current Outpatient Medications on File Prior to Visit  Medication Sig   acetaminophen (TYLENOL) 650 MG CR tablet Take 650 mg by mouth every 8 (eight) hours as needed for pain.   amiodarone (PACERONE) 100 MG tablet TAKE 1 TABLET BY MOUTH EVERY DAY   atorvastatin (LIPITOR) 40 MG tablet Take 20-40 mg by mouth See admin instructions. Takes 20 mg daily except on Monday, Wednesday and Fridays and takes 40 mg takes 0.5 tablet in the morning and 0.5 tablet in the evening   donepezil (ARICEPT) 10 MG tablet Take 10 mg by mouth at bedtime.   ELIQUIS 5 MG TABS tablet TAKE 1 TABLET BY MOUTH TWICE A DAY   furosemide (LASIX) 40 MG tablet Take 1 tablet (40 mg total) by mouth daily.   Multiple Vitamin (MULTI-VITAMIN PO) Take 1 tablet by mouth daily.    nebivolol (BYSTOLIC) 5 MG tablet Take 5 mg by mouth daily.   Omega-3 Fatty Acids (FISH OIL) 1000 MG CAPS Take 1,000 mg by mouth daily.   No current facility-administered medications on file prior to visit.      Allergies:   Patient has no known allergies.   Social History   Socioeconomic History   Marital status: Married    Spouse name: [name]   Number of children: 3   Years of education: HS   Highest education level: Not on file  Occupational History   Occupation: RET    Employer: [employer]    Comment: N/A    Employer: [employer] resource strain: Not on file   Food insecurity    Worry: Not on file    Inability: Not on file   Transportation needs    Medical: Not on file    Non-medical: Not on file  Tobacco Use   Smoking status: Never Smoker   Smokeless tobacco: Never Used    Substance and Sexual Activity   Alcohol use: No   Drug use: No   Sexual activity: Never  Lifestyle   Physical activity    Days per week: Not on file    Minutes per session: Not on file   Stress: Not on file  Relationships   Social connections    Talks on phone: Not on file    Gets together: Not on file    Attends religious service: Not on file    Active member of club or organization: Not on file    Attends meetings of clubs or organizations: Not on file    Relationship status: Not on file  Other Topics Concern   Not on file  Social History Narrative   Patient lives at home with his  spouseWilburn Cornelia)   Patient has three children.   Married 57 yrs   Caffeine Use: 1/2 cup daily.   Patient is retired.   Patient is right-handed.     Family History: The patient's family history includes Heart disease in his mother.  ROS:   Please see the history of present illness.  Additional pertinent ROS: Constitutional: Negative for chills, fever, night sweats, unintentional weight loss  HENT: Negative for ear pain and hearing loss.   Eyes: Negative for loss of vision and eye pain.  Respiratory: Negative for cough, sputum, wheezing.   Cardiovascular: See HPI. Gastrointestinal: Negative for abdominal pain, melena, and hematochezia.  Genitourinary: Negative for dysuria and hematuria.  Musculoskeletal: Negative for myalgias. Positive for falls. Skin: Negative for itching and rash.  Neurological: Negative for focal weakness, focal sensory changes and loss of consciousness.  Endo/Heme/Allergies: Does bruise/bleed easily.    EKGs/Labs/Other Studies Reviewed:    The following studies were reviewed today: Echo 04/12/2016 - Left ventricle: The cavity size was normal. Wall thickness was   increased in a pattern of severe LVH. Systolic function was   normal. The estimated ejection fraction was in the range of 55%   to 60%. Wall motion was normal; there were no regional wall   motion  abnormalities. - Aortic valve: Cusp separation was mildly reduced. - Left atrium: The atrium was severely dilated. - Right ventricle: The cavity size was moderately dilated. Wall   thickness was normal. - Right atrium: The atrium was severely dilated. - Tricuspid valve: There was mild-moderate regurgitation. - Pulmonary arteries: Systolic pressure was mildly to moderately   increased. PA peak pressure: 39 mm Hg (S).  Notes from Dr Wynonia Lawman: Echo 12/01/15 Severe concentric LVH with EF 50%, no rWMA Grade 2 diastolic dysfunction LAE Mild aortic valve calcification with trivial aortic stenosis Mild/moderate MR Mild/moderate TR Dilated IVC with blunted response  EKG:  EKG is personally reviewed.  The ekg ordered 05/17/18 demonstrates sinus bradycardia at a rate of 53 bpm, RBBB  Recent Labs: 05/16/2018: ALT 19; BUN 19; Creatinine, Ser 1.41; Potassium 3.7; Sodium 141; TSH 0.753 05/17/2018: Hemoglobin 13.3; Platelets 106  Recent Lipid Panel No results found for: CHOL, TRIG, HDL, CHOLHDL, VLDL, LDLCALC, LDLDIRECT   Physical Exam:    VS:  BP (!) 106/58    Pulse (!) 51    Ht 6' (1.829 m)    Wt 247 lb 6.4 oz (112.2 kg)    SpO2 97%    BMI 33.55 kg/m     Wt Readings from Last 3 Encounters:  10/24/18 247 lb 6.4 oz (112.2 kg)  05/17/18 250 lb (113.4 kg)  05/16/18 249 lb 1.9 oz (113 kg)    GEN: Well nourished, well developed in no acute distress HEENT: Normal, moist mucous membranes. Right facial area with healing abrasions NECK: No JVD CARDIAC: regular rhythm, normal S1 and S2, no rubs, gallops. 2/6 SEM, early peaking. 1/6 HSM.  VASCULAR: Radial and DP pulses 2+ bilaterally. No carotid bruits RESPIRATORY:  Clear to auscultation without rales, wheezing or rhonchi  ABDOMEN: Soft, non-tender, non-distended MUSCULOSKELETAL:  In wheelchair but states he can ambulate with cane or walker SKIN: Warm and dry. Chronic bilateral LE venous stasis changes with skin discoloration, brawny edema. Circular  plaque over left medial shin (noted as chronic per family) NEUROLOGIC:  Alert and appropriate to questioning. No focal neuro deficits noted. PSYCHIATRIC:  Normal affect   ASSESSMENT:    1. Paroxysmal atrial fibrillation (HCC)   2. Chronic venous  insufficiency   3. Chronic diastolic (congestive) heart failure ([place])   4. Essential hypertension   5. Coronary artery disease involving native coronary artery of native heart without angina pectoris    PLAN:    Paroxysmal atrial fibrillation: on long term anticoagulation. Presently rhythm controlled. -CHA2DS2/VAS Stroke Risk Points=  5  -continue apixaban, 5 mg BID dose given weight and Cr <1.5 -discussed that if he falls and hits his head, he should have urgent evaluation and possibly CT head as he is on anticoagulation. -continue amiodarone. See prior notes for discussion -LFTs, TSH normal on PCP labs  Chronic diastolic heart failure: appears euvolemic today (chronic venous insufficiency) -on furosemide for fluid management  Hypertension: as above, has been running on the low side. Monitor -on bystolic, recently decreased from 10 mg to 5 mg daily. If BP continues to decrease, then may need to consider stopping entirely.  CAD s/p CABG: no angina -no aspirin as he is on apixaban. -on atorvastatin, is on individualized dosing -on bystolic, recently decreased dose, as above  Mixed hyperlipidemia -atorvastatin, dosing as noted -LDL not at goal, but does not tolerate higher dose statin. Given age and multiple meds, will hold on ezetimibe for now. Is on OTC fish oil as well.  Chronic venous insufficiency: brawny edema with skin changes and discoloration. Elevate legs, compression stockings if able. Reaffirmed this with family today.  Obesity: BMI 33 -we discussed activity today. I feel that from a cardiac standpoint, any activity is beneficial, but he needs to be safe (ie monitored, with walker > cane) to avoid risk of falls. He will try to  get back to doing slow walking.  Plan for follow up: 6 mos or sooner PRN  TIME SPENT WITH PATIENT: 25 minutes of direct patient care. More than 50% of that time was spent on coordination of care and counseling regarding chronic venous insufficiency recommendations, medication review and monitoring, recommendations for activity.  Buford Dresser, MD, PhD [place]   [facility]   Medication Adjustments/Labs and Tests Ordered: Current medicines are reviewed at length with the patient today.  Concerns regarding medicines are outlined above.  No orders of the defined types were placed in this encounter.  No orders of the defined types were placed in this encounter.   Patient Instructions  Medication Instructions:  Your Physician recommend you continue on your current medication as directed.    If you need a refill on your cardiac medications before your next appointment, please call your pharmacy.   Lab work: None  Testing/Procedures: None  Follow-Up: At [facility], you and your health needs are our priority.  As part of our continuing mission to provide you with exceptional heart care, we have created designated Provider Care Teams.  These Care Teams include your primary Cardiologist (physician) and Advanced Practice Providers (APPs -  Physician Assistants and Nurse Practitioners) who all work together to provide you with the care you need, when you need it. You will need a follow up appointment in 6 months.  Please call our office 2 months in advance to schedule this appointment.  You may see Dr. Harrell Gave or one of the following Advanced Practice Providers on your designated Care Team:   Rosaria Ferries, PA-C  Jory Sims, DNP, ANP        Signed, Buford Dresser, MD PhD 10/24/2018 7:13 PM    [facility]

## 2018-12-05 ENCOUNTER — Other Ambulatory Visit: Payer: Self-pay | Admitting: Cardiology

## 2019-02-05 ENCOUNTER — Other Ambulatory Visit: Payer: Self-pay | Admitting: Cardiology

## 2019-02-05 ENCOUNTER — Other Ambulatory Visit: Payer: Self-pay

## 2019-02-05 MED ORDER — APIXABAN 5 MG PO TABS: 5.0000 mg | ORAL_TABLET | Freq: Two times a day (BID) | ORAL | 1 refills | Status: DC

## 2019-02-05 NOTE — Telephone Encounter (Signed)
Pt's daughter called in this morning stating pt is out of medication.

## 2019-02-20 ENCOUNTER — Telehealth: Payer: Self-pay | Admitting: Cardiology

## 2019-02-20 NOTE — Telephone Encounter (Signed)
Pt c/o medication issue:  1. Name of Medication: Nitroglycerin 0.4 MG  2. How are you currently taking this medication (dosage and times per day)? No  3. Are you having a reaction (difficulty breathing--STAT)? No   4. What is your medication issue? Patients daughter is calling stating the patients prescription for Nitroglycerin expired in 2018. If it is still necessary for him to have the medication a new prescription will need to be sent to the pharmacy. Please advise.

## 2019-02-20 NOTE — Telephone Encounter (Signed)
Attempted to return call to daughter Santiago Glad but her VM is full  Need to know if patient is having chest pain/anginal symptoms?

## 2019-02-22 MED ORDER — NITROGLYCERIN 0.4 MG SL SUBL: 0.4000 mg | SUBLINGUAL_TABLET | SUBLINGUAL | 11 refills | Status: AC | PRN

## 2019-02-22 NOTE — Telephone Encounter (Signed)
Fine to refill if he wants it. Please go over the instructions on how to use (1 under tongue every 5 min, for up to 3 doses, call EMS if chest pain not gone at the third dose). Thanks.

## 2019-02-22 NOTE — Telephone Encounter (Signed)
Spoke to patient's daughter Juliann Pulse NTG instructions reviewed.She voiced understanding.Prescription sent to pharmacy.

## 2019-02-22 NOTE — Telephone Encounter (Signed)
Tried to CB daughter unable to leave VM is full. To discuss Nitro directions and to see if pt has been having chest pain.

## 2019-04-01 ENCOUNTER — Inpatient Hospital Stay (HOSPITAL_COMMUNITY)
Admission: EM | Admit: 2019-04-01 | Discharge: 2019-04-23 | DRG: 377 | Disposition: E | Payer: Medicare PPO | Attending: Student | Admitting: Student

## 2019-04-01 ENCOUNTER — Other Ambulatory Visit: Payer: Self-pay

## 2019-04-01 DIAGNOSIS — K5791 Diverticulosis of intestine, part unspecified, without perforation or abscess with bleeding: Secondary | ICD-10-CM | POA: Diagnosis not present

## 2019-04-01 DIAGNOSIS — M1990 Unspecified osteoarthritis, unspecified site: Secondary | ICD-10-CM | POA: Diagnosis present

## 2019-04-01 DIAGNOSIS — Z6833 Body mass index (BMI) 33.0-33.9, adult: Secondary | ICD-10-CM

## 2019-04-01 DIAGNOSIS — Z20822 Contact with and (suspected) exposure to covid-19: Secondary | ICD-10-CM | POA: Diagnosis present

## 2019-04-01 DIAGNOSIS — K922 Gastrointestinal hemorrhage, unspecified: Secondary | ICD-10-CM | POA: Diagnosis not present

## 2019-04-01 DIAGNOSIS — F0390 Unspecified dementia without behavioral disturbance: Secondary | ICD-10-CM | POA: Diagnosis present

## 2019-04-01 DIAGNOSIS — G2571 Drug induced akathisia: Secondary | ICD-10-CM | POA: Diagnosis present

## 2019-04-01 DIAGNOSIS — I440 Atrioventricular block, first degree: Secondary | ICD-10-CM | POA: Diagnosis present

## 2019-04-01 DIAGNOSIS — G4733 Obstructive sleep apnea (adult) (pediatric): Secondary | ICD-10-CM | POA: Diagnosis present

## 2019-04-01 DIAGNOSIS — D62 Acute posthemorrhagic anemia: Secondary | ICD-10-CM | POA: Diagnosis not present

## 2019-04-01 DIAGNOSIS — I480 Paroxysmal atrial fibrillation: Secondary | ICD-10-CM | POA: Diagnosis not present

## 2019-04-01 DIAGNOSIS — R001 Bradycardia, unspecified: Secondary | ICD-10-CM | POA: Diagnosis not present

## 2019-04-01 DIAGNOSIS — N189 Chronic kidney disease, unspecified: Secondary | ICD-10-CM

## 2019-04-01 DIAGNOSIS — F419 Anxiety disorder, unspecified: Secondary | ICD-10-CM | POA: Diagnosis present

## 2019-04-01 DIAGNOSIS — R55 Syncope and collapse: Secondary | ICD-10-CM | POA: Diagnosis not present

## 2019-04-01 DIAGNOSIS — N179 Acute kidney failure, unspecified: Secondary | ICD-10-CM | POA: Diagnosis present

## 2019-04-01 DIAGNOSIS — K625 Hemorrhage of anus and rectum: Secondary | ICD-10-CM | POA: Diagnosis present

## 2019-04-01 DIAGNOSIS — E662 Morbid (severe) obesity with alveolar hypoventilation: Secondary | ICD-10-CM | POA: Diagnosis present

## 2019-04-01 DIAGNOSIS — I2510 Atherosclerotic heart disease of native coronary artery without angina pectoris: Secondary | ICD-10-CM | POA: Diagnosis present

## 2019-04-01 DIAGNOSIS — Z9841 Cataract extraction status, right eye: Secondary | ICD-10-CM

## 2019-04-01 DIAGNOSIS — Z9842 Cataract extraction status, left eye: Secondary | ICD-10-CM

## 2019-04-01 DIAGNOSIS — K648 Other hemorrhoids: Secondary | ICD-10-CM | POA: Diagnosis present

## 2019-04-01 DIAGNOSIS — R0602 Shortness of breath: Secondary | ICD-10-CM

## 2019-04-01 DIAGNOSIS — N1831 Chronic kidney disease, stage 3a: Secondary | ICD-10-CM | POA: Diagnosis present

## 2019-04-01 DIAGNOSIS — Z86010 Personal history of colonic polyps: Secondary | ICD-10-CM

## 2019-04-01 DIAGNOSIS — I2721 Secondary pulmonary arterial hypertension: Secondary | ICD-10-CM | POA: Diagnosis present

## 2019-04-01 DIAGNOSIS — Z9049 Acquired absence of other specified parts of digestive tract: Secondary | ICD-10-CM

## 2019-04-01 DIAGNOSIS — I4510 Unspecified right bundle-branch block: Secondary | ICD-10-CM | POA: Diagnosis present

## 2019-04-01 DIAGNOSIS — E669 Obesity, unspecified: Secondary | ICD-10-CM | POA: Diagnosis present

## 2019-04-01 DIAGNOSIS — Z85038 Personal history of other malignant neoplasm of large intestine: Secondary | ICD-10-CM | POA: Diagnosis present

## 2019-04-01 DIAGNOSIS — E0590 Thyrotoxicosis, unspecified without thyrotoxic crisis or storm: Secondary | ICD-10-CM | POA: Diagnosis present

## 2019-04-01 DIAGNOSIS — I5032 Chronic diastolic (congestive) heart failure: Secondary | ICD-10-CM | POA: Diagnosis present

## 2019-04-01 DIAGNOSIS — D696 Thrombocytopenia, unspecified: Secondary | ICD-10-CM | POA: Diagnosis present

## 2019-04-01 DIAGNOSIS — Z66 Do not resuscitate: Secondary | ICD-10-CM | POA: Diagnosis not present

## 2019-04-01 DIAGNOSIS — Z7901 Long term (current) use of anticoagulants: Secondary | ICD-10-CM

## 2019-04-01 DIAGNOSIS — G9341 Metabolic encephalopathy: Secondary | ICD-10-CM | POA: Diagnosis present

## 2019-04-01 DIAGNOSIS — I130 Hypertensive heart and chronic kidney disease with heart failure and stage 1 through stage 4 chronic kidney disease, or unspecified chronic kidney disease: Secondary | ICD-10-CM | POA: Diagnosis present

## 2019-04-01 DIAGNOSIS — F329 Major depressive disorder, single episode, unspecified: Secondary | ICD-10-CM | POA: Diagnosis present

## 2019-04-01 DIAGNOSIS — Z79899 Other long term (current) drug therapy: Secondary | ICD-10-CM

## 2019-04-01 DIAGNOSIS — R0902 Hypoxemia: Secondary | ICD-10-CM

## 2019-04-01 DIAGNOSIS — I371 Nonrheumatic pulmonary valve insufficiency: Secondary | ICD-10-CM | POA: Diagnosis present

## 2019-04-01 DIAGNOSIS — Z8249 Family history of ischemic heart disease and other diseases of the circulatory system: Secondary | ICD-10-CM

## 2019-04-01 DIAGNOSIS — J9601 Acute respiratory failure with hypoxia: Secondary | ICD-10-CM | POA: Diagnosis present

## 2019-04-01 DIAGNOSIS — E785 Hyperlipidemia, unspecified: Secondary | ICD-10-CM | POA: Diagnosis present

## 2019-04-01 DIAGNOSIS — Z951 Presence of aortocoronary bypass graft: Secondary | ICD-10-CM

## 2019-04-01 DIAGNOSIS — Z515 Encounter for palliative care: Secondary | ICD-10-CM | POA: Diagnosis not present

## 2019-04-01 DIAGNOSIS — F05 Delirium due to known physiological condition: Secondary | ICD-10-CM | POA: Diagnosis present

## 2019-04-01 DIAGNOSIS — F0281 Dementia in other diseases classified elsewhere with behavioral disturbance: Secondary | ICD-10-CM

## 2019-04-01 DIAGNOSIS — I10 Essential (primary) hypertension: Secondary | ICD-10-CM | POA: Diagnosis present

## 2019-04-01 LAB — CBC WITH DIFFERENTIAL/PLATELET
Abs Immature Granulocytes: 0 10*3/uL (ref 0.00–0.07)
Basophils Absolute: 0 10*3/uL (ref 0.0–0.1)
Basophils Relative: 1 %
Eosinophils Absolute: 0.1 10*3/uL (ref 0.0–0.5)
Eosinophils Relative: 1 %
HCT: 38.1 % — ABNORMAL LOW (ref 39.0–52.0)
Hemoglobin: 12.7 g/dL — ABNORMAL LOW (ref 13.0–17.0)
Immature Granulocytes: 0 %
Lymphocytes Relative: 26 %
Lymphs Abs: 1.1 10*3/uL (ref 0.7–4.0)
MCH: 32.4 pg (ref 26.0–34.0)
MCHC: 33.3 g/dL (ref 30.0–36.0)
MCV: 97.2 fL (ref 80.0–100.0)
Monocytes Absolute: 0.5 10*3/uL (ref 0.1–1.0)
Monocytes Relative: 11 %
Neutro Abs: 2.7 10*3/uL (ref 1.7–7.7)
Neutrophils Relative %: 61 %
Platelets: 123 10*3/uL — ABNORMAL LOW (ref 150–400)
RBC: 3.92 MIL/uL — ABNORMAL LOW (ref 4.22–5.81)
RDW: 12.8 % (ref 11.5–15.5)
WBC: 4.3 10*3/uL (ref 4.0–10.5)
nRBC: 0 % (ref 0.0–0.2)

## 2019-04-01 LAB — COMPREHENSIVE METABOLIC PANEL
ALT: 22 U/L (ref 0–44)
AST: 29 U/L (ref 15–41)
Albumin: 3.4 g/dL — ABNORMAL LOW (ref 3.5–5.0)
Alkaline Phosphatase: 85 U/L (ref 38–126)
Anion gap: 13 (ref 5–15)
BUN: 32 mg/dL — ABNORMAL HIGH (ref 8–23)
CO2: 25 mmol/L (ref 22–32)
Calcium: 8.8 mg/dL — ABNORMAL LOW (ref 8.9–10.3)
Chloride: 105 mmol/L (ref 98–111)
Creatinine, Ser: 1.84 mg/dL — ABNORMAL HIGH (ref 0.61–1.24)
GFR calc Af Amer: 38 mL/min — ABNORMAL LOW (ref >60)
GFR calc non Af Amer: 33 mL/min — ABNORMAL LOW (ref >60)
Glucose, Bld: 85 mg/dL (ref 70–99)
Potassium: 3.7 mmol/L (ref 3.5–5.1)
Sodium: 143 mmol/L (ref 135–145)
Total Bilirubin: 1.1 mg/dL (ref 0.3–1.2)
Total Protein: 5.8 g/dL — ABNORMAL LOW (ref 6.5–8.1)

## 2019-04-01 LAB — HEMOGLOBIN AND HEMATOCRIT, BLOOD
HCT: 33.3 % — ABNORMAL LOW (ref 39.0–52.0)
HCT: 32.3 % — ABNORMAL LOW (ref 39.0–52.0)
Hemoglobin: 11.4 g/dL — ABNORMAL LOW (ref 13.0–17.0)
Hemoglobin: 11 g/dL — ABNORMAL LOW (ref 13.0–17.0)

## 2019-04-01 LAB — TYPE AND SCREEN
ABO/RH(D): A POS
Antibody Screen: NEGATIVE

## 2019-04-01 LAB — RESPIRATORY PANEL BY RT PCR (FLU A&B, COVID)
Influenza A by PCR: NEGATIVE
Influenza B by PCR: NEGATIVE
SARS Coronavirus 2 by RT PCR: NEGATIVE

## 2019-04-01 LAB — PROTIME-INR
INR: 1.4 — ABNORMAL HIGH (ref 0.8–1.2)
Prothrombin Time: 16.6 seconds — ABNORMAL HIGH (ref 11.4–15.2)

## 2019-04-01 MED ORDER — ONDANSETRON HCL 4 MG PO TABS: 4.0000 mg | ORAL_TABLET | Freq: Four times a day (QID) | ORAL | Status: DC | PRN

## 2019-04-01 MED ORDER — ATORVASTATIN CALCIUM 40 MG PO TABS
40.0000 mg | ORAL_TABLET | ORAL | Status: DC
  Administered 2019-04-02: 40 mg via ORAL

## 2019-04-01 MED ORDER — SODIUM CHLORIDE 0.9% FLUSH
3.0000 mL | Freq: Two times a day (BID) | INTRAVENOUS | Status: DC
  Administered 2019-04-01 – 2019-04-04 (×6): 3 mL via INTRAVENOUS

## 2019-04-01 MED ORDER — DONEPEZIL HCL 10 MG PO TABS
10.0000 mg | ORAL_TABLET | Freq: Every day | ORAL | Status: DC
  Administered 2019-04-01: 10 mg via ORAL
  Filled 2019-04-01 (×2): qty 1

## 2019-04-01 MED ORDER — ACETAMINOPHEN 325 MG PO TABS
650.0000 mg | ORAL_TABLET | Freq: Four times a day (QID) | ORAL | Status: DC | PRN
  Administered 2019-04-01 – 2019-04-02 (×3): 650 mg via ORAL
  Filled 2019-04-01 (×3): qty 2

## 2019-04-01 MED ORDER — ONDANSETRON HCL 4 MG/2ML IJ SOLN: 4.0000 mg | Freq: Four times a day (QID) | INTRAMUSCULAR | Status: DC | PRN

## 2019-04-01 MED ORDER — PANTOPRAZOLE SODIUM 40 MG IV SOLR
40.0000 mg | Freq: Once | INTRAVENOUS | Status: AC
  Administered 2019-04-01: 11:00:00 40 mg via INTRAVENOUS
  Filled 2019-04-01: qty 40

## 2019-04-01 MED ORDER — SODIUM CHLORIDE 0.9 % IV BOLUS
500.0000 mL | Freq: Once | INTRAVENOUS | Status: AC
  Administered 2019-04-01: 500 mL via INTRAVENOUS

## 2019-04-01 MED ORDER — ACETAMINOPHEN 650 MG RE SUPP: 650.0000 mg | Freq: Four times a day (QID) | RECTAL | Status: DC | PRN

## 2019-04-01 MED ORDER — AMIODARONE HCL 100 MG PO TABS
100.0000 mg | ORAL_TABLET | Freq: Every day | ORAL | Status: DC
  Administered 2019-04-02 – 2019-04-04 (×2): 100 mg via ORAL
  Filled 2019-04-01 (×3): qty 1

## 2019-04-01 MED ORDER — SODIUM CHLORIDE 0.9 % IV SOLN: INTRAVENOUS | Status: DC

## 2019-04-01 MED ORDER — BUPROPION HCL ER (XL) 150 MG PO TB24
150.0000 mg | ORAL_TABLET | Freq: Every day | ORAL | Status: DC
  Administered 2019-04-01 – 2019-04-04 (×3): 150 mg via ORAL
  Filled 2019-04-01 (×4): qty 1

## 2019-04-01 MED ORDER — ATORVASTATIN CALCIUM 10 MG PO TABS: 20.0000 mg | ORAL_TABLET | ORAL | Status: DC

## 2019-04-01 MED ORDER — ALBUTEROL SULFATE (2.5 MG/3ML) 0.083% IN NEBU: 2.5000 mg | INHALATION_SOLUTION | Freq: Four times a day (QID) | RESPIRATORY_TRACT | Status: DC | PRN

## 2019-04-01 MED ORDER — ATORVASTATIN CALCIUM 10 MG PO TABS
20.0000 mg | ORAL_TABLET | ORAL | Status: DC
  Administered 2019-04-01 – 2019-04-03 (×2): 20 mg via ORAL
  Filled 2019-04-01 (×2): qty 2

## 2019-04-01 NOTE — ED Provider Notes (Signed)
[facility] EMERGENCY DEPARTMENT Provider Note   CSN: [id number] Arrival date & time: 04/03/2019  [id number]     History Chief Complaint  Patient presents with  . GI Bleeding    Vincent Sharp is a 86 y.o. male.  The history is provided by the patient, the EMS personnel, a relative and medical records. No language interpreter was used.   Vincent Sharp is a 86 y.o. male who presents to the Emergency Department complaining of rectal bleeding. He presents the emergency department from home for evaluation of bright red blood per rectum times three this morning. He has a history of hemorrhoids and does have occasional bleeding but these episodes are different due to feeling the commode with bright red blood. He does have some dizziness when he goes to stand but states that this is chronic. He does take eloquence for history of atrial fibrillation. He denies any fevers, chest pain, abdominal pain, nausea, vomiting, shortness of breath. Symptoms are moderate and constant nature.    Past Medical History:  Diagnosis Date  . Anxiety   . Arthritis   . CAD (coronary artery disease), native coronary artery 12/26/2013   CABG with LIMA to LAD SVG to OM, SVG to dx1 1992 Dr. Redmond Pulling Cath 2009 scattered irregularities Left main, occluded LAD, occluded CFX, 40% stenosis proximal RCA, widely patent OM 1 SVG, widely patent LAD LIMA graft and fills first diagonal branch but with stenosis between mammary insertion site and diagonal branch, occluded Diag 1 SVG   . CHF (congestive heart failure) ([place]) 03/2016  . Colon cancer ([place])    skin, colon  . Complex sleep apnea syndrome 05/18/2013  . Coronary artery disease   . Depression   . Essential hypertension 12/26/2013  . Headache   . History of colon cancer    Treated with partial colectomy   . Hyperlipidemia   . Hypertension   . Lazy eye, left   . Obesity (BMI 30-39.9)   . Obesity hypoventilation syndrome ([place])   . OSA on CPAP   . PAF  (paroxysmal atrial fibrillation) ([place])   . Pneumonia    back in 2015  . Spinal stenosis in cervical region 12/11/2012    Patient Active Problem List   Diagnosis Date Noted  . Chronic venous insufficiency 04/17/2018  . Chronic diastolic (congestive) heart failure ([place]) 04/11/2016  . Paroxysmal atrial fibrillation ([facility])   . Current use of long term anticoagulation   . History of colon cancer   . Obesity (BMI 30-39.9)   . Coronary artery disease involving native coronary artery of native heart without angina pectoris 12/26/2013  . Essential hypertension 12/26/2013  . Hyperlipidemia 12/26/2013  . S/P CABG (coronary artery bypass graft) 12/26/2013  . OSA on CPAP   . Spinal stenosis in cervical region 12/11/2012    Past Surgical History:  Procedure Laterality Date  . CARDIOVERSION N/A 04/13/2016   Procedure: CARDIOVERSION;  Surgeon: Jacolyn Reedy, MD;  Location: [facility] ENDOSCOPY;  Service: Cardiovascular;  Laterality: N/A;  . CARDIOVERSION N/A 05/17/2016   Procedure: CARDIOVERSION;  Surgeon: Jacolyn Reedy, MD;  Location: [facility];  Service: Cardiovascular;  Laterality: N/A;  . CATARACT EXTRACTION Bilateral   . CORONARY ARTERY BYPASS GRAFT  1992   x3, Dr Redmond Pulling surgeon,  Dr Wynonia Lawman cardiologist  . EYE SURGERY    . HEMORRHOID SURGERY    . HERNIA REPAIR     right inguinal  . HYDROCELE EXCISION / REPAIR    . INGUINAL HERNIA  REPAIR Left 10/08/2016   Procedure: LEFT INGUINAL HERNIA REPAIR WITH MESH;  Surgeon: Jackolyn Confer, MD;  Location: [facility];  Service: General;  Laterality: Left;  TAP BLOCK  AND LMA  . INSERTION OF MESH Left 10/08/2016   Procedure: INSERTION OF MESH;  Surgeon: Jackolyn Confer, MD;  Location: [facility];  Service: General;  Laterality: Left;  . KNEE ARTHROSCOPY W/ MENISCAL REPAIR Right   . PARTIAL COLECTOMY    . POLYPECTOMY  1989  . SHOULDER ARTHROSCOPY WITH ROTATOR CUFF REPAIR AND SUBACROMIAL DECOMPRESSION Right 12/27/2013   Procedure: RIGHT SHOULDER ARTHROSCOPY  SUBACROMIAL DECOMPRESSION DISTAL CLAVICLE RESECTION AND ROTATOR CUFF REPAIR, BISCEPS TENOTOMY;  Surgeon: Marin Shutter, MD;  Location: [facility];  Service: Orthopedics;  Laterality: Right;  . TONSILLECTOMY         Family History  Problem Relation Age of Onset  . Heart disease Mother     Social History   Tobacco Use  . Smoking status: Never Smoker  . Smokeless tobacco: Never Used  Substance Use Topics  . Alcohol use: No  . Drug use: No    Home Medications Prior to Admission medications   Medication Sig Start Date End Date Taking? Authorizing Provider  acetaminophen (TYLENOL) 650 MG CR tablet Take 1,300 mg by mouth every 8 (eight) hours as needed for pain.    Yes [provider]  amiodarone (PACERONE) 100 MG tablet TAKE 1 TABLET BY MOUTH EVERY DAY Patient taking differently: Take 100 mg by mouth daily.  12/05/18  Yes Buford Dresser, MD  apixaban (ELIQUIS) 5 MG TABS tablet Take 1 tablet (5 mg total) by mouth 2 (two) times daily. 02/05/19  Yes Buford Dresser, MD  atorvastatin (LIPITOR) 40 MG tablet Take 20-40 mg by mouth See admin instructions. Takes 20 mg daily except on Monday, Wednesday and Fridays and takes 40 mg takes 0.5 tablet in the morning and 0.5 tablet in the evening   Yes [provider]  buPROPion (WELLBUTRIN XL) 150 MG 24 hr tablet Take 150 mg by mouth daily. 03/10/19  Yes [provider]  donepezil (ARICEPT) 10 MG tablet Take 10 mg by mouth at bedtime. 02/19/19  Yes [provider]  Multiple Vitamin (MULTI-VITAMIN PO) Take 1 tablet by mouth daily.    Yes [provider]  nebivolol (BYSTOLIC) 5 MG tablet Take 5 mg by mouth daily.   Yes [provider]  Omega-3 Fatty Acids (FISH OIL) 1000 MG CAPS Take 1,000 mg by mouth daily.   Yes [provider]  furosemide (LASIX) 40 MG tablet Take 1 tablet (40 mg total) by mouth daily. Patient not taking: Reported on 03/27/2019 05/03/18   Buford Dresser, MD   nitroGLYCERIN (NITROSTAT) 0.4 MG SL tablet Place 1 tablet (0.4 mg total) under the tongue every 5 (five) minutes as needed for chest pain. Patient not taking: Reported on 04/13/2019 02/22/19 05/23/19  Buford Dresser, MD    Allergies    Patient has no known allergies.  Review of Systems   Review of Systems  All other systems reviewed and are negative.   Physical Exam Updated Vital Signs BP (!) 111/48   Pulse (!) 49   Temp 98.1 F (36.7 C)   Resp 13   Ht 6' (1.829 m)   Wt 113.4 kg   SpO2 92%   BMI 33.91 kg/m   Physical Exam Vitals and nursing note reviewed.  Constitutional:      Appearance: He is well-developed.  HENT:     Head: Normocephalic  and atraumatic.  Cardiovascular:     Rate and Rhythm: Normal rate and regular rhythm.     Heart sounds: No murmur.  Pulmonary:     Effort: Pulmonary effort is normal. No respiratory distress.     Breath sounds: Normal breath sounds.  Abdominal:     Palpations: Abdomen is soft.     Tenderness: There is no abdominal tenderness. There is no guarding or rebound.  Genitourinary:    Comments: No external hemorrhoids. Nontender rectal examination. Small amount of gross blood in the rectal vault. Musculoskeletal:        General: No tenderness.     Comments: 1 to 2+ pitting edema to bilateral lower extremities  Skin:    General: Skin is warm and dry.  Neurological:     Mental Status: He is alert and oriented to person, place, and time.  Psychiatric:        Behavior: Behavior normal.     ED Results / Procedures / Treatments   Labs (all labs ordered are listed, but only abnormal results are displayed) Labs Reviewed  CBC WITH DIFFERENTIAL/PLATELET - Abnormal; Notable for the following components:      Result Value   RBC 3.92 (*)    Hemoglobin 12.7 (*)    HCT 38.1 (*)    Platelets 123 (*)    All other components within normal limits  PROTIME-INR - Abnormal; Notable for the following components:   Prothrombin Time 16.6  (*)    INR 1.4 (*)    All other components within normal limits  SARS CORONAVIRUS 2 (TAT 6-24 HRS)  COMPREHENSIVE METABOLIC PANEL  TYPE AND SCREEN    EKG EKG Interpretation  Date/Time:  Sunday April 01 2019 07:37:17 EST Ventricular Rate:  50 PR Interval:    QRS Duration: 171 QT Interval:  501 QTC Calculation: 457 R Axis:   -52 Text Interpretation: Sinus rhythm RBBB and LAFB Interpretation limited secondary to artifact Confirmed by Quintella Reichert [phone]) on 04/07/2019 7:49:25 AM   Radiology No results found.  Procedures Procedures (including critical care time)  Medications Ordered in ED Medications  sodium chloride 0.9 % bolus 500 mL (500 mLs Intravenous New Bag/Given 04/16/2019 [id number])    ED Course  I have reviewed the triage vital signs and the nursing notes.  Pertinent labs & imaging results that were available during my care of the patient were reviewed by me and considered in my medical decision making (see chart for details).    MDM Rules/Calculators/A&P                     Patient with history of atrial fibrillation on eliquis, last dose last night here for evaluation of hematochezia. Three episodes prior to ED arrival with one episode in the emergency department. He does have borderline blood pressures with mild bradycardia in the emergency department but is well perfused. He is on a beta blocker at home, last dose was yesterday. Will treat with gentle IV fluid hydration. Hemoglobin similar compared to priors at 12.7, mild thrombocytopenia that is similar when compared to priors. Discussed with Dr. Michail Sermon with [facility] who will see the patient and consult. Hospitalist consulted for admission. Patient and daughter updated findings of studies recommendation for admission and they are in agreement treatment plan. Final Clinical Impression(s) / ED Diagnoses Final diagnoses:  None    Rx / DC Orders ED Discharge Orders    None       Quintella Reichert, MD 04/19/2019 1547

## 2019-04-01 NOTE — ED Notes (Signed)
Daughter Santiago Glad at bedside [phone]

## 2019-04-01 NOTE — ED Triage Notes (Signed)
Pt BIB [facility] from home after 3 episodes of blood in his stool. Family reported to EMS that it was a significant amount of blood that filled the toilet. Pt has a history of GI bleeding in the past per family's report to EMS. Pt reports some dizziness when he gets up in the morning. Does have a history of dementia. Denying any pain at present time.

## 2019-04-01 NOTE — H&P (Addendum)
History and Physical    Vincent Sharp [id number] DOB: August 06, 1932 DOA: 03/26/2019  Referring MD/NP/PA: Quintella Reichert, MD PCP: Burnard Bunting, MD  Patient coming from: home   Chief Complaint: Bloody bowel movements  I have personally briefly reviewed patient's old medical records in [place]   HPI: Vincent Sharp is a 86 y.o. male with medical history significant of hypertension, hyperlipidemia, paroxysmal atrial fibrillation on chronic anticoagulation, CAD s/p CABG, chronic diastolic congestive heart failure, depression, mild dementia, colon cancer s/p resection, and hemorrhoids s/p hemorrhoidectomy.  He presents with complaints of a significant amount of gross bright red blood in his stools this morning.  Reports having at least 3 significant bowel movements while at home.  Complains of feeling lightheaded whenever standing or changing position that may be a little worse than normal.  He is on Eliquis for proximal atrial fibrillation and last took the medicine last night.  Associated symptoms include some stomach rumbling which means he is about to have a another bowel movement.  He reports his last colonoscopy was several years ago.  Denies having any fever, shortness of breath, chest pain, nausea, or vomiting.  Patient denies any use of NSAIDs.  ED Course: Upon admission to the emergency department patient was noted to be afebrile, pulse 46-70, blood pressure 92/51-114/51, and all other vital signs maintained.  Labs significant for hemoglobin 12.7, platelets 123 BUN 32, creatinine 1.84, and INR 1.4.  Patient was typed and screened for need of blood products.  Dr. Michail Sermon per gastroenterology was consulted.  TR[facility] called to admit.  Review of Systems  Constitutional: Negative for fever and malaise/fatigue.  HENT: Negative for congestion and ear discharge.   Eyes: Negative for photophobia and pain.  Respiratory: Negative for cough and shortness of breath.   Cardiovascular:  Negative for chest pain and leg swelling.  Gastrointestinal: Positive for blood in stool and diarrhea. Negative for abdominal pain, nausea and vomiting.  Genitourinary: Negative for dysuria and hematuria.  Musculoskeletal: Negative for falls.  Skin: Positive for rash. Negative for itching.  Neurological: Positive for dizziness. Negative for focal weakness.  Psychiatric/Behavioral: Negative for hallucinations and substance abuse.    Past Medical History:  Diagnosis Date  . Anxiety   . Arthritis   . CAD (coronary artery disease), native coronary artery 12/26/2013   CABG with LIMA to LAD SVG to OM, SVG to dx1 1992 Dr. Redmond Pulling Cath 2009 scattered irregularities Left main, occluded LAD, occluded CFX, 40% stenosis proximal RCA, widely patent OM 1 SVG, widely patent LAD LIMA graft and fills first diagonal branch but with stenosis between mammary insertion site and diagonal branch, occluded Diag 1 SVG   . CHF (congestive heart failure) ([place]) 03/2016  . Colon cancer ([place])    skin, colon  . Complex sleep apnea syndrome 05/18/2013  . Coronary artery disease   . Depression   . Essential hypertension 12/26/2013  . Headache   . History of colon cancer    Treated with partial colectomy   . Hyperlipidemia   . Hypertension   . Lazy eye, left   . Obesity (BMI 30-39.9)   . Obesity hypoventilation syndrome ([place])   . OSA on CPAP   . PAF (paroxysmal atrial fibrillation) ([place])   . Pneumonia    back in 2015  . Spinal stenosis in cervical region 12/11/2012    Past Surgical History:  Procedure Laterality Date  . CARDIOVERSION N/A 04/13/2016   Procedure: CARDIOVERSION;  Surgeon: Jacolyn Reedy, MD;  Location:  [facility] ENDOSCOPY;  Service: Cardiovascular;  Laterality: N/A;  . CARDIOVERSION N/A 05/17/2016   Procedure: CARDIOVERSION;  Surgeon: Jacolyn Reedy, MD;  Location: [facility];  Service: Cardiovascular;  Laterality: N/A;  . CATARACT EXTRACTION Bilateral   . CORONARY ARTERY BYPASS GRAFT  1992   x3,  Dr Redmond Pulling surgeon,  Dr Wynonia Lawman cardiologist  . EYE SURGERY    . HEMORRHOID SURGERY    . HERNIA REPAIR     right inguinal  . HYDROCELE EXCISION / REPAIR    . INGUINAL HERNIA REPAIR Left 10/08/2016   Procedure: LEFT INGUINAL HERNIA REPAIR WITH MESH;  Surgeon: Jackolyn Confer, MD;  Location: [facility];  Service: General;  Laterality: Left;  TAP BLOCK  AND LMA  . INSERTION OF MESH Left 10/08/2016   Procedure: INSERTION OF MESH;  Surgeon: Jackolyn Confer, MD;  Location: [facility];  Service: General;  Laterality: Left;  . KNEE ARTHROSCOPY W/ MENISCAL REPAIR Right   . PARTIAL COLECTOMY    . POLYPECTOMY  1989  . SHOULDER ARTHROSCOPY WITH ROTATOR CUFF REPAIR AND SUBACROMIAL DECOMPRESSION Right 12/27/2013   Procedure: RIGHT SHOULDER ARTHROSCOPY SUBACROMIAL DECOMPRESSION DISTAL CLAVICLE RESECTION AND ROTATOR CUFF REPAIR, BISCEPS TENOTOMY;  Surgeon: Marin Shutter, MD;  Location: [facility];  Service: Orthopedics;  Laterality: Right;  . TONSILLECTOMY       reports that he has never smoked. He has never used smokeless tobacco. He reports that he does not drink alcohol or use drugs.  No Known Allergies  Family History  Problem Relation Age of Onset  . Heart disease Mother     Prior to Admission medications   Medication Sig Start Date End Date Taking? Authorizing Provider  acetaminophen (TYLENOL) 650 MG CR tablet Take 1,300 mg by mouth every 8 (eight) hours as needed for pain.    Yes [provider]  amiodarone (PACERONE) 100 MG tablet TAKE 1 TABLET BY MOUTH EVERY DAY Patient taking differently: Take 100 mg by mouth daily.  12/05/18  Yes Buford Dresser, MD  apixaban (ELIQUIS) 5 MG TABS tablet Take 1 tablet (5 mg total) by mouth 2 (two) times daily. 02/05/19  Yes Buford Dresser, MD  atorvastatin (LIPITOR) 40 MG tablet Take 20-40 mg by mouth See admin instructions. Takes 20 mg daily except on Monday, Wednesday and Fridays and takes 40 mg takes 0.5 tablet in the morning and 0.5 tablet in the  evening   Yes [provider]  buPROPion (WELLBUTRIN XL) 150 MG 24 hr tablet Take 150 mg by mouth daily. 03/10/19  Yes [provider]  donepezil (ARICEPT) 10 MG tablet Take 10 mg by mouth at bedtime. 02/19/19  Yes [provider]  Multiple Vitamin (MULTI-VITAMIN PO) Take 1 tablet by mouth daily.    Yes [provider]  nebivolol (BYSTOLIC) 5 MG tablet Take 5 mg by mouth daily.   Yes [provider]  Omega-3 Fatty Acids (FISH OIL) 1000 MG CAPS Take 1,000 mg by mouth daily.   Yes [provider]  furosemide (LASIX) 40 MG tablet Take 1 tablet (40 mg total) by mouth daily. Patient not taking: Reported on 04/10/2019 05/03/18   Buford Dresser, MD  nitroGLYCERIN (NITROSTAT) 0.4 MG SL tablet Place 1 tablet (0.4 mg total) under the tongue every 5 (five) minutes as needed for chest pain. Patient not taking: Reported on 04/21/2019 02/22/19 05/23/19  Buford Dresser, MD    Physical Exam:  Constitutional: Elderly male who appears to be in NAD, calm, comfortable  Vitals:   04/15/2019 0930  03/31/2019 0945 03/31/2019 1000 03/30/2019 1015  BP: (!) 98/51 (!) 111/48 101/67 (!) 106/42  Pulse: (!) 50 (!) 49 (!) 50 (!) 47  Resp: 17 13 17 12   Temp:      SpO2: 98% 92% 93% 94%  Weight:      Height:       Eyes: PERRL, lids and conjunctivae normal ENMT: Mucous membranes are dry. Posterior pharynx clear of any exudate or lesions.  Neck: normal, supple, no masses, no thyromegaly Respiratory: clear to auscultation bilaterally, no wheezing, no crackles. Normal respiratory effort. No accessory muscle use.  Cardiovascular: Regular rate and rhythm, no murmurs / rubs / gallops. No extremity edema. 2+ pedal pulses. No carotid bruits.  Abdomen: no tenderness, no masses palpated. No hepatosplenomegaly. Bowel sounds positive.  Musculoskeletal: no clubbing / cyanosis. No joint deformity upper and lower extremities. Good ROM, no contractures. Normal muscle tone.  Skin:  Occasional areas of bruising noted of the lower extremities.  She denies rash of the left lower leg. Neurologic: CN 2-12 grossly intact. Sensation intact, DTR normal. Strength 5/5 in all 4.  Psychiatric: Normal judgment and insight. Alert and oriented x 3. Normal mood.     Labs on Admission: I have personally reviewed following labs and imaging studies  CBC: Recent Labs  Lab 03/27/2019 0751  WBC 4.3  NEUTROABS 2.7  HGB 12.7*  HCT 38.1*  MCV 97.2  PLT [id number]*   Basic Metabolic Panel: Recent Labs  Lab 04/17/2019 0751  NA 143  K 3.7  CL 105  CO2 25  GLUCOSE 85  BUN 32*  CREATININE 1.84*  CALCIUM 8.8*   GFR: Estimated Creatinine Clearance: 38.8 mL/min (A) (by C-G formula based on SCr of 1.84 mg/dL (H)). Liver Function Tests: Recent Labs  Lab 04/07/2019 0751  AST 29  ALT 22  ALKPHOS 85  BILITOT 1.1  PROT 5.8*  ALBUMIN 3.4*   No results for input(s): LIPASE, AMYLASE in the last 168 hours. No results for input(s): AMMONIA in the last 168 hours. Coagulation Profile: Recent Labs  Lab 04/22/2019 0751  INR 1.4*   Cardiac Enzymes: No results for input(s): CKTOTAL, CKMB, CKMBINDEX, TROPONINI in the last 168 hours. BNP (last 3 results) No results for input(s): PROBNP in the last 8760 hours. HbA1C: No results for input(s): HGBA1C in the last 72 hours. CBG: No results for input(s): GLUCAP in the last 168 hours. Lipid Profile: No results for input(s): CHOL, HDL, LDLCALC, TRIG, CHOLHDL, LDLDIRECT in the last 72 hours. Thyroid Function Tests: No results for input(s): TSH, T4TOTAL, FREET4, T3FREE, THYROIDAB in the last 72 hours. Anemia Panel: No results for input(s): VITAMINB12, FOLATE, FERRITIN, TIBC, IRON, RETICCTPCT in the last 72 hours. Urine analysis:    Component Value Date/Time   COLORURINE YELLOW 04/11/2016 1236   APPEARANCEUR CLEAR 04/11/2016 1236   LABSPEC 1.013 04/11/2016 1236   PHURINE 5.0 04/11/2016 1236   GLUCOSEU NEGATIVE 04/11/2016 1236   HGBUR NEGATIVE  04/11/2016 1236   BILIRUBINUR NEGATIVE 04/11/2016 1236   KETONESUR NEGATIVE 04/11/2016 1236   PROTEINUR NEGATIVE 04/11/2016 1236   UROBILINOGEN 4.0 (H) 08/27/2014 0353   NITRITE NEGATIVE 04/11/2016 1236   LEUKOCYTESUR NEGATIVE 04/11/2016 1236   Sepsis Labs: No results found for this or any previous visit (from the past 240 hour(s)).   Radiological Exams on Admission: No results found.  EKG: Independently reviewed.  Sinus rhythm at 50 bpm  Assessment/Plan GI bleeding, acute blood loss anemia: Patient presents home after having 3 episodes of BRBPR at home  and 1 in the emergency department. Hemoglobin appears stable at 12.7 g/dL.  Last took his Eliquis last night.  He was typed and screened for possible need blood products and Dr. Michail Sermon gastroenterology was consulted. -Admit to a progressive bed -Monitor H&H  -Transfuse blood products as needed for hemoglobin less than 8 or if patient becomes symptomatic -Normal saline IV fluids at 50 mL/h -Appreciate GI consultative services, will follow for further recommendations  Paroxysmal atrial fibrillation on anticoagulation: On admission patient appear to be in sinus rhythm, but heart rates as low as the 40s. -Hold Eliquis  -Continue amiodarone -Follow-up telemetry  Acute kidney injury superimposed on chronic kidney disease stage IIIa: Patient baseline creatinine previously have been around 1.4.  He presents with creatinine elevated up to 1.84 with BUN 32.  Suspect prerenal cause given acute blood loss. -IV fluids as seen above -Recheck BMP in a.m.  Chronic diastolic congestive heart: Last EF noted to be 55 to 60% with severely dilated atriums, mild to moderate pulmonary artery hypertension, and mild to moderate pulmonary regurgitation. -Strict intake and output -Daily weights  Essential hypertension: Initial blood pressures soft 92/51.  Home blood pressure medications include Bystolic 5 mg daily. -Hold Bystolic due to soft blood  pressures  History of colon cancer  Mild dementia -Continue donepezil  Hyperlipidemia  -Continue atorvastatin  Obesity: BMI 33.91  DVT prophylaxis: Eliquis Code Status: Full Family Communication: No family present at bedside Disposition Plan: Possible discharge home in 1 to 2 days Consults called: Gastroenterology Admission status: Observation  Norval Morton MD [facility] Pager [phone]   If 7PM-7AM, please contact night-coverage [email or web address] Password [facility]  04/07/2019, 10:54 AM

## 2019-04-01 NOTE — Progress Notes (Signed)
Patient noted to have moderate amount of dark blood clots per rectum. Provided incontinence care. Elita Boone, BSN.

## 2019-04-01 NOTE — Consult Note (Signed)
Referring Provider: Dr. Ralene Sharp Primary Care Physician:  Vincent Bunting, MD Primary Gastroenterologist:  Dr. Verl Sharp)  Reason for Consultation:  GI bleed  HPI: Vincent Sharp is a 86 y.o. male with history of colon cancer in 1989 with partial colectomy (location not known) who had the acute onset of bright red blood per rectum that happened twice at home and was explosive per his daughter. Has had 3 additional bloody stools in the ER. No abdominal pain, N/V or melena. She denies diarrhea. ER rectal reports small gross blood in vault. nontender DRE and no external hemorrhoids seen. He has a history of constipation. He has dementia and is not able to give me a history. He was seen recently by his GI doctor Vincent Sharp last month for blood with wiping and was found to have large prolapsing internal hemorrhoids and was treated with topical therapy. He is on Eliquis for Afib. Last colonoscopy in 2013. S/P hemorrhoidal banding in 2011. Hgb 12.7 on admit and now 11.4 (13.3 in March 2020). Daughter present. Nursing in room.   Past Medical History:  Diagnosis Date  . Anxiety   . Arthritis   . CAD (coronary artery disease), native coronary artery 12/26/2013   CABG with LIMA to LAD SVG to OM, SVG to dx1 1992 Dr. Redmond Sharp Cath 2009 scattered irregularities Left main, occluded LAD, occluded CFX, 40% stenosis proximal RCA, widely patent OM 1 SVG, widely patent LAD LIMA graft and fills first diagonal branch but with stenosis between mammary insertion site and diagonal branch, occluded Diag 1 SVG   . CHF (congestive heart failure) ([place]) 03/2016  . Colon cancer ([place])    skin, colon  . Complex sleep apnea syndrome 05/18/2013  . Coronary artery disease   . Depression   . Essential hypertension 12/26/2013  . Headache   . History of colon cancer    Treated with partial colectomy   . Hyperlipidemia   . Hypertension   . Lazy eye, left   . Obesity (BMI 30-39.9)   . Obesity hypoventilation syndrome ([place])   .  OSA on CPAP   . PAF (paroxysmal atrial fibrillation) ([place])   . Pneumonia    back in 2015  . Spinal stenosis in cervical region 12/11/2012    Past Surgical History:  Procedure Laterality Date  . CARDIOVERSION N/A 04/13/2016   Procedure: CARDIOVERSION;  Surgeon: Vincent Reedy, MD;  [facility];  Service: Cardiovascular;  Laterality: N/A;  . CARDIOVERSION N/A 05/17/2016   Procedure: CARDIOVERSION;  Surgeon: Vincent Reedy, MD;  Location: [place];  Service: Cardiovascular;  Laterality: N/A;  . CATARACT EXTRACTION Bilateral   . CORONARY ARTERY BYPASS GRAFT  1992   x3, Dr Vincent Sharp surgeon,  Dr Vincent Sharp cardiologist  . EYE SURGERY    . HEMORRHOID SURGERY    . HERNIA REPAIR     right inguinal  . HYDROCELE EXCISION / REPAIR    . INGUINAL HERNIA REPAIR Left 10/08/2016   Procedure: LEFT INGUINAL HERNIA REPAIR WITH MESH;  Surgeon: Vincent Confer, MD;  Location: [place];  Service: General;  Laterality: Left;  TAP BLOCK  AND LMA  . INSERTION OF MESH Left 10/08/2016   Procedure: INSERTION OF MESH;  Surgeon: Vincent Confer, MD;  Location: [place];  Service: General;  Laterality: Left;  . KNEE ARTHROSCOPY W/ MENISCAL REPAIR Right   . PARTIAL COLECTOMY    . POLYPECTOMY  1989  . SHOULDER ARTHROSCOPY WITH ROTATOR CUFF REPAIR AND SUBACROMIAL DECOMPRESSION Right 12/27/2013   Procedure: RIGHT  SHOULDER ARTHROSCOPY SUBACROMIAL DECOMPRESSION DISTAL CLAVICLE RESECTION AND ROTATOR CUFF REPAIR, BISCEPS TENOTOMY;  Surgeon: Vincent Shutter, MD;  Location: [place];  Service: Orthopedics;  Laterality: Right;  . TONSILLECTOMY      Prior to Admission medications   Medication Sig Start Date End Date Taking? Authorizing Provider  acetaminophen (TYLENOL) 650 MG CR tablet Take 1,300 mg by mouth every 8 (eight) hours as needed for pain.    Yes [provider]  amiodarone (PACERONE) 100 MG tablet TAKE 1 TABLET BY MOUTH EVERY DAY Patient taking differently: Take 100 mg by mouth daily.  12/05/18  Yes  Vincent Dresser, MD  apixaban (ELIQUIS) 5 MG TABS tablet Take 1 tablet (5 mg total) by mouth 2 (two) times daily. 02/05/19  Yes Vincent Dresser, MD  atorvastatin (LIPITOR) 40 MG tablet Take 20-40 mg by mouth See admin instructions. Takes 20 mg daily except on Monday, Wednesday and Fridays and takes 40 mg takes 0.5 tablet in the morning and 0.5 tablet in the evening   Yes [provider]  buPROPion (WELLBUTRIN XL) 150 MG 24 hr tablet Take 150 mg by mouth daily. 03/10/19  Yes [provider]  donepezil (ARICEPT) 10 MG tablet Take 10 mg by mouth at bedtime. 02/19/19  Yes [provider]  Multiple Vitamin (MULTI-VITAMIN PO) Take 1 tablet by mouth daily.    Yes [provider]  nebivolol (BYSTOLIC) 5 MG tablet Take 5 mg by mouth daily.   Yes [provider]  Omega-3 Fatty Acids (FISH OIL) 1000 MG CAPS Take 1,000 mg by mouth daily.   Yes [provider]  furosemide (LASIX) 40 MG tablet Take 1 tablet (40 mg total) by mouth daily. Patient not taking: Reported on 04/15/2019 05/03/18   Vincent Dresser, MD  nitroGLYCERIN (NITROSTAT) 0.4 MG SL tablet Place 1 tablet (0.4 mg total) under the tongue every 5 (five) minutes as needed for chest pain. Patient not taking: Reported on 03/30/2019 02/22/19 05/23/19  Vincent Dresser, MD    Scheduled Meds: . Vincent Sharp ON 04/02/2019] amiodarone  100 mg Oral Daily  . atorvastatin  20 mg Oral Once per day on Sun Tue Thu Sat   And  . [START ON 04/02/2019] atorvastatin  40 mg Oral Once per day on Mon Wed Fri  . buPROPion  150 mg Oral Daily  . donepezil  10 mg Oral QHS  . sodium chloride flush  3 mL Intravenous Q12H   Continuous Infusions: . sodium chloride     PRN Meds:.acetaminophen **OR** acetaminophen, albuterol, ondansetron **OR** ondansetron (ZOFRAN) IV  Allergies as of 04/04/2019  . (No Known Allergies)    Family History  Problem Relation Age of Onset  . Heart disease Mother     Social  History   Socioeconomic History  . Marital status: Married    Spouse name: Vincent Sharp  . Number of children: 3  . Years of education: HS  . Highest education level: Not on file  Occupational History  . Occupation: RET    Employer: [employer]    Comment: N/A    Employer: [employer]  Tobacco Use  . Smoking status: Never Smoker  . Smokeless tobacco: Never Used  Substance and Sexual Activity  . Alcohol use: No  . Drug use: No  . Sexual activity: Never  Other Topics Concern  . Not on file  Social History Narrative   Patient lives at home with his spouseWilburn Sharp)   Patient has three children.   Married 60 yrs  Caffeine Use: 1/2 cup daily.   Patient is retired.   Patient is right-handed.   Social Determinants of Health   Financial Resource Strain:   . Difficulty of Paying Living Expenses: Not on file  Food Insecurity:   . Worried About Charity fundraiser in the Last Year: Not on file  . Ran Out of Food in the Last Year: Not on file  Transportation Needs:   . Lack of Transportation (Medical): Not on file  . Lack of Transportation (Non-Medical): Not on file  Physical Activity:   . Days of Exercise per Week: Not on file  . Minutes of Exercise per Session: Not on file  Stress:   . Feeling of Stress : Not on file  Social Connections:   . Frequency of Communication with Friends and Family: Not on file  . Frequency of Social Gatherings with Friends and Family: Not on file  . Attends Religious Services: Not on file  . Active Member of Clubs or Organizations: Not on file  . Attends Archivist Meetings: Not on file  . Marital Status: Not on file  Intimate Partner Violence:   . Fear of Current or Ex-Partner: Not on file  . Emotionally Abused: Not on file  . Physically Abused: Not on file  . Sexually Abused: Not on file    Review of Systems: All negative except as stated above in HPI.  Physical Exam: Vital signs: Vitals:   04/18/2019 1307 03/29/2019 1342   BP:    Pulse: (!) 50 (!) 50  Resp: 13   Temp:  97.7 F (36.5 C)  SpO2: 100% 98%  BP 106/61   General:   Lethargic, demented, well-nourished, no acute distress Head: normocephalic, atraumatic Eyes: anicteric sclera ENT: oropharynx clear Neck: supple, nontender Lungs:  Clear throughout to auscultation.   No wheezes, crackles, or rhonchi. No acute distress. Heart:  Regular rate and rhythm; no murmurs, clicks, rubs,  or gallops. Abdomen: soft, nontender, nondistended, +BS  Rectal:  Deferred Ext: no edema  GI:  Lab Results: Recent Labs    04/22/2019 0751 03/28/2019 1313  WBC 4.3  --   HGB 12.7* 11.4*  HCT 38.1* 33.3*  PLT 123*  --    BMET Recent Labs    04/21/2019 0751  NA 143  K 3.7  CL 105  CO2 25  GLUCOSE 85  BUN 32*  CREATININE 1.84*  CALCIUM 8.8*   LFT Recent Labs    04/03/2019 0751  PROT 5.8*  ALBUMIN 3.4*  AST 29  ALT 22  ALKPHOS 85  BILITOT 1.1   PT/INR Recent Labs    04/04/2019 0751  LABPROT 16.6*  INR 1.4*     Studies/Results: No results found.  Impression/Plan: Rectal bleeding in the setting of Eliquis and remote history of colon cancer. Suspect diverticular bleed vs rectal outlet source. Doubt due to hemorrhoids. Hold Eliquis. Conservative management. Clear liquid diet. I do not think an updated colonoscopy is needed but will depend on whether bleeding persists and hemodynamic status and will defer to Dr. Collene Mares who will assume GI coverage tomorrow.    LOS: 0 days   Lear Ng  04/10/2019, 1:57 PM  Questions please call [phone]

## 2019-04-02 ENCOUNTER — Encounter (HOSPITAL_COMMUNITY): Payer: Self-pay | Admitting: Internal Medicine

## 2019-04-02 ENCOUNTER — Observation Stay (HOSPITAL_COMMUNITY): Payer: Medicare PPO

## 2019-04-02 DIAGNOSIS — J9601 Acute respiratory failure with hypoxia: Secondary | ICD-10-CM | POA: Diagnosis not present

## 2019-04-02 DIAGNOSIS — E785 Hyperlipidemia, unspecified: Secondary | ICD-10-CM | POA: Diagnosis present

## 2019-04-02 DIAGNOSIS — F0390 Unspecified dementia without behavioral disturbance: Secondary | ICD-10-CM | POA: Diagnosis present

## 2019-04-02 DIAGNOSIS — I5032 Chronic diastolic (congestive) heart failure: Secondary | ICD-10-CM | POA: Diagnosis present

## 2019-04-02 DIAGNOSIS — F05 Delirium due to known physiological condition: Secondary | ICD-10-CM | POA: Diagnosis present

## 2019-04-02 DIAGNOSIS — E662 Morbid (severe) obesity with alveolar hypoventilation: Secondary | ICD-10-CM | POA: Diagnosis present

## 2019-04-02 DIAGNOSIS — Z7901 Long term (current) use of anticoagulants: Secondary | ICD-10-CM | POA: Diagnosis not present

## 2019-04-02 DIAGNOSIS — E0590 Thyrotoxicosis, unspecified without thyrotoxic crisis or storm: Secondary | ICD-10-CM | POA: Diagnosis present

## 2019-04-02 DIAGNOSIS — Z515 Encounter for palliative care: Secondary | ICD-10-CM | POA: Diagnosis not present

## 2019-04-02 DIAGNOSIS — K625 Hemorrhage of anus and rectum: Secondary | ICD-10-CM | POA: Diagnosis not present

## 2019-04-02 DIAGNOSIS — G2571 Drug induced akathisia: Secondary | ICD-10-CM | POA: Diagnosis present

## 2019-04-02 DIAGNOSIS — Z789 Other specified health status: Secondary | ICD-10-CM | POA: Diagnosis not present

## 2019-04-02 DIAGNOSIS — D62 Acute posthemorrhagic anemia: Secondary | ICD-10-CM | POA: Diagnosis present

## 2019-04-02 DIAGNOSIS — Z85038 Personal history of other malignant neoplasm of large intestine: Secondary | ICD-10-CM | POA: Diagnosis not present

## 2019-04-02 DIAGNOSIS — Z66 Do not resuscitate: Secondary | ICD-10-CM | POA: Diagnosis not present

## 2019-04-02 DIAGNOSIS — K922 Gastrointestinal hemorrhage, unspecified: Secondary | ICD-10-CM | POA: Diagnosis present

## 2019-04-02 DIAGNOSIS — K5791 Diverticulosis of intestine, part unspecified, without perforation or abscess with bleeding: Secondary | ICD-10-CM | POA: Diagnosis present

## 2019-04-02 DIAGNOSIS — Z20822 Contact with and (suspected) exposure to covid-19: Secondary | ICD-10-CM | POA: Diagnosis present

## 2019-04-02 DIAGNOSIS — N179 Acute kidney failure, unspecified: Secondary | ICD-10-CM | POA: Diagnosis present

## 2019-04-02 DIAGNOSIS — K648 Other hemorrhoids: Secondary | ICD-10-CM | POA: Diagnosis present

## 2019-04-02 DIAGNOSIS — R55 Syncope and collapse: Secondary | ICD-10-CM | POA: Diagnosis not present

## 2019-04-02 DIAGNOSIS — I480 Paroxysmal atrial fibrillation: Secondary | ICD-10-CM | POA: Diagnosis present

## 2019-04-02 DIAGNOSIS — G9341 Metabolic encephalopathy: Secondary | ICD-10-CM | POA: Diagnosis present

## 2019-04-02 DIAGNOSIS — Z951 Presence of aortocoronary bypass graft: Secondary | ICD-10-CM | POA: Diagnosis not present

## 2019-04-02 DIAGNOSIS — I130 Hypertensive heart and chronic kidney disease with heart failure and stage 1 through stage 4 chronic kidney disease, or unspecified chronic kidney disease: Secondary | ICD-10-CM | POA: Diagnosis present

## 2019-04-02 DIAGNOSIS — G4733 Obstructive sleep apnea (adult) (pediatric): Secondary | ICD-10-CM | POA: Diagnosis present

## 2019-04-02 DIAGNOSIS — I2510 Atherosclerotic heart disease of native coronary artery without angina pectoris: Secondary | ICD-10-CM | POA: Diagnosis present

## 2019-04-02 LAB — CBC
HCT: 28.2 % — ABNORMAL LOW (ref 39.0–52.0)
HCT: 28.3 % — ABNORMAL LOW (ref 39.0–52.0)
Hemoglobin: 9.9 g/dL — ABNORMAL LOW (ref 13.0–17.0)
Hemoglobin: 9.8 g/dL — ABNORMAL LOW (ref 13.0–17.0)
MCH: 32.5 pg (ref 26.0–34.0)
MCH: 32.7 pg (ref 26.0–34.0)
MCHC: 35.1 g/dL (ref 30.0–36.0)
MCHC: 34.6 g/dL (ref 30.0–36.0)
MCV: 92.5 fL (ref 80.0–100.0)
MCV: 94.3 fL (ref 80.0–100.0)
Platelets: 120 10*3/uL — ABNORMAL LOW (ref 150–400)
Platelets: 129 10*3/uL — ABNORMAL LOW (ref 150–400)
RBC: 3.05 MIL/uL — ABNORMAL LOW (ref 4.22–5.81)
RBC: 3 MIL/uL — ABNORMAL LOW (ref 4.22–5.81)
RDW: 12.6 % (ref 11.5–15.5)
RDW: 12.5 % (ref 11.5–15.5)
WBC: 3.5 10*3/uL — ABNORMAL LOW (ref 4.0–10.5)
WBC: 3.9 10*3/uL — ABNORMAL LOW (ref 4.0–10.5)
nRBC: 0 % (ref 0.0–0.2)
nRBC: 0 % (ref 0.0–0.2)

## 2019-04-02 LAB — BASIC METABOLIC PANEL
Anion gap: 6 (ref 5–15)
BUN: 21 mg/dL (ref 8–23)
CO2: 26 mmol/L (ref 22–32)
Calcium: 8.2 mg/dL — ABNORMAL LOW (ref 8.9–10.3)
Chloride: 106 mmol/L (ref 98–111)
Creatinine, Ser: 1.36 mg/dL — ABNORMAL HIGH (ref 0.61–1.24)
GFR calc Af Amer: 55 mL/min — ABNORMAL LOW (ref >60)
GFR calc non Af Amer: 47 mL/min — ABNORMAL LOW (ref >60)
Glucose, Bld: 117 mg/dL — ABNORMAL HIGH (ref 70–99)
Potassium: 3.6 mmol/L (ref 3.5–5.1)
Sodium: 138 mmol/L (ref 135–145)

## 2019-04-02 LAB — TSH: TSH: 0.334 u[IU]/mL — ABNORMAL LOW (ref 0.350–4.500)

## 2019-04-02 LAB — RETICULOCYTES
Immature Retic Fract: 10.6 % (ref 2.3–15.9)
RBC.: 2.93 MIL/uL — ABNORMAL LOW (ref 4.22–5.81)
Retic Count, Absolute: 47.8 10*3/uL (ref 19.0–186.0)
Retic Ct Pct: 1.6 % (ref 0.4–3.1)

## 2019-04-02 LAB — IRON AND TIBC
Iron: 64 ug/dL (ref 45–182)
Saturation Ratios: 26 % (ref 17.9–39.5)
TIBC: 242 ug/dL — ABNORMAL LOW (ref 250–450)
UIBC: 178 ug/dL

## 2019-04-02 LAB — HEMOGLOBIN AND HEMATOCRIT, BLOOD
HCT: 28 % — ABNORMAL LOW (ref 39.0–52.0)
HCT: 32.1 % — ABNORMAL LOW (ref 39.0–52.0)
Hemoglobin: 9.6 g/dL — ABNORMAL LOW (ref 13.0–17.0)
Hemoglobin: 11.2 g/dL — ABNORMAL LOW (ref 13.0–17.0)

## 2019-04-02 LAB — FOLATE: Folate: 35.6 ng/mL (ref >5.9)

## 2019-04-02 LAB — FERRITIN: Ferritin: 87 ng/mL (ref 24–336)

## 2019-04-02 LAB — VITAMIN B12: Vitamin B-12: 506 pg/mL (ref 180–914)

## 2019-04-02 MED ORDER — SODIUM CHLORIDE 0.9 % IV BOLUS
500.0000 mL | Freq: Once | INTRAVENOUS | Status: AC
  Administered 2019-04-02: 500 mL via INTRAVENOUS

## 2019-04-02 MED ORDER — HALOPERIDOL LACTATE 5 MG/ML IJ SOLN: 2.0000 mg | Freq: Once | INTRAMUSCULAR | Status: DC

## 2019-04-02 MED ORDER — LORAZEPAM 2 MG/ML IJ SOLN
1.0000 mg | Freq: Once | INTRAMUSCULAR | Status: AC
  Administered 2019-04-02: 1 mg via INTRAVENOUS
  Filled 2019-04-02: qty 1

## 2019-04-02 MED ORDER — LORAZEPAM 2 MG/ML IJ SOLN: 2.0000 mg | Freq: Four times a day (QID) | INTRAMUSCULAR | Status: DC | PRN

## 2019-04-02 MED ORDER — HALOPERIDOL LACTATE 5 MG/ML IJ SOLN
5.0000 mg | Freq: Once | INTRAMUSCULAR | Status: AC
  Administered 2019-04-02: 5 mg via INTRAVENOUS
  Filled 2019-04-02: qty 1

## 2019-04-02 NOTE — Progress Notes (Addendum)
Patient's daughter Vincent Sharp) is in the room and she is very concerned on the level of agitation her father is in.  She stated that she never has seen her father this way.  Vincent Sharp spoke to charge nurse Sharee Pimple), and Sharee Pimple was able to explained the treatment plan at this time.  Patient was given Haldol and Ativan today w/o any progress in reducing patient's agitation.   [facility] has been paged to make them aware of daughter's concerns.  MD called back, has placed Ativan 2 mg order PRN.  Stated to let daughter know to decrease agitation medication is given in small amounts due to age,will placed PRN order if it is needed for patient.  I will let daughter know.

## 2019-04-02 NOTE — Progress Notes (Signed)
Progress Note    Vincent Sharp  [id number] DOB: 1932/08/31  DOA: 04/18/2019 PCP: Burnard Bunting, MD    Brief Narrative:   Chief complaint: BRBPR  Medical records reviewed and are as summarized below:  Vincent Sharp is an 86 y.o. male medical history of dementia, colon cancer in 1989 with partial colectomy admitted for bright red blood per rectum and anemia.  Evaluated by gastroenterology who opined rectal bleeding in the setting of Eliquis with remote history of colon cancer suspecting diverticular bleed versus rectal outlet source.  Amending conservative management at time of admission.  Assessment/Plan:   Principal Problem:   GI bleed Active Problems:   Essential hypertension   Chronic diastolic (congestive) heart failure (HCC)   Acute blood loss anemia   Acute respiratory failure with hypoxia (HCC)   History of colon cancer   Paroxysmal atrial fibrillation (HCC)   Current use of long term anticoagulation   Hyperlipidemia   Obesity (BMI 30-39.9)   #1.  Bright red blood per rectum/GI bleed.  Continues to have bright red blood per rectum.  Approximately 5 or 6 episodes during the night with reportedly amounts diminishing and development of clots.  tolerating clear liquids.  Home medications to include Eliquis.  Hemoglobin is drifting down.  He is afebrile and hemodynamically stable. -Continue to hold Eliquis -Continue clear liquids -Serial H&H -Monitor closely -Defer further management to gastroenterology  #2.  Acute blood loss anemia.  Hemoglobin 12.7 yesterday.  Has drifted down to 9.6 today.  Likely related to above in setting of Eliquis. -Anemia panel for completeness -Serial H&H -Gentle IV fluids -Transfuse if hemoglobin 8 or less and/or patient continues to have bright red blood per rectum  #3.  Paroxysmal atrial fibrillation on anticoagulation/bradycardia.  Home medications include Eliquis.  EKG reveals sinus rhythm with a right bundle branch block.   Heart rate range has been 48-64 during the night.  -Monitor -Continue to hold Eliquis -Obtain a TSH  #4.  Acute kidney injury superimposed on chronic kidney disease stage III.  Chart review indicates creatinine baseline around 1.4.  Creatinine trending up today to 1.8. -Hold nephrotoxins -Gentle IV fluids -Monitor  #5.  Chronic diastolic heart failure.  Last echo 55 to 60% with severely dilated atrium is mild to moderate pulmonary artery hypertension and mild to moderate pulmonary regurg.  Home medications include Lasix and amiodarone.  Holding Lasix for now.  Does not appear to be overloaded. -daily weights -intake and output -continue amiodarone  #6.  Hypertension.  Blood pressure on the low end of normal initially.  Home medications include Bystolic and Lasix.  Blood pressure remains on the low end of normal -Holding Lasix and Bystolic for now -Monitor closely  #7.  Acute respiratory failure with hypoxia.  Oxygen saturation level dropped to 87% during bed change/exertion.  Not on oxygen at home he is obese and has a history of obstructive sleep apnea and does not comply with machine. -Portable chest -Oxygen supplementation -Incentive spirometry -As needed nebs  #8.  History of dementia.  He lives at home with his wife.  Daughter from [place] has been staying with him.  Does experience sundowners anxiety while in the hospital.  Home medications include donepezil -delerium precautions -monitor    Family Communication/Anticipated D/C date and plan/Code Status   DVT prophylaxis: Lovenox ordered. Code Status: Full Code.  Family Communication: Daughter at bedside Disposition Plan: Family wants patient to be discharged home when ready   Medical Consultants:  [facility]   Anti-Infectives:    None  Subjective:   Awake alert well nourished. Denies any pain/nausea.  Daughter who spent the night with patient reports 5 or 6 episodes of bright red blood per rectum during  the night  Objective:    Vitals:   04/02/2019 2025 04/02/19 0108 04/02/19 0301 04/02/19 0758  BP: (!) 100/54 (!) 108/54 (!) 108/95 (!) 106/52  Pulse: 64 (!) 57 (!) 51 (!) 55  Resp: 14 (!) 22 15   Temp: 98.1 F (36.7 C) 98.4 F (36.9 C) 98.2 F (36.8 C) 98.1 F (36.7 C)  TempSrc: Oral Axillary Axillary Oral  SpO2: 95% 99% 94% (!) 87%  Weight:   104.6 kg   Height:        Intake/Output Summary (Last 24 hours) at 04/02/2019 1121 Last data filed at 04/02/2019 0900 Gross per 24 hour  Intake 2178.61 ml  Output 1 ml  Net 2177.61 ml   Filed Weights   04/04/2019 0735 04/02/19 0301  Weight: 113.4 kg 104.6 kg    Exam: General: Well-nourished slightly pale awake alert no acute distress, dementia CV: Regular rate and rhythm I hear no murmur gallop or rub no lower extremity edema Respiratory: Respirations are slightly shallow breath sounds are somewhat distant I hear no crackles or wheezes Abdomen: Nondistended soft positive bowel sounds throughout no guarding or rebounding Neuro: Awake alert oriented to self only speech clear facial symmetry moving all extremities  Data Reviewed:   I have personally reviewed following labs and imaging studies:  Labs: Labs show the following:   Basic Metabolic Panel: Recent Labs  Lab 03/31/2019 0751  NA 143  K 3.7  CL 105  CO2 25  GLUCOSE 85  BUN 32*  CREATININE 1.84*  CALCIUM 8.8*   GFR Estimated Creatinine Clearance: 37.4 mL/min (A) (by C-G formula based on SCr of 1.84 mg/dL (H)). Liver Function Tests: Recent Labs  Lab 04/14/2019 0751  AST 29  ALT 22  ALKPHOS 85  BILITOT 1.1  PROT 5.8*  ALBUMIN 3.4*   No results for input(s): LIPASE, AMYLASE in the last 168 hours. No results for input(s): AMMONIA in the last 168 hours. Coagulation profile Recent Labs  Lab 03/26/2019 0751  INR 1.4*    CBC: Recent Labs  Lab 04/04/2019 0751 03/31/2019 1313 04/17/2019 1630 04/07/2019 2344 04/02/19 0257  WBC 4.3  --   --  3.5*  --   NEUTROABS 2.7   --   --   --   --   HGB 12.7* 11.4* 11.0* 9.9* 9.6*  HCT 38.1* 33.3* 32.3* 28.2* 28.0*  MCV 97.2  --   --  92.5  --   PLT 123*  --   --  120*  --    Cardiac Enzymes: No results for input(s): CKTOTAL, CKMB, CKMBINDEX, TROPONINI in the last 168 hours. BNP (last 3 results) No results for input(s): PROBNP in the last 8760 hours. CBG: No results for input(s): GLUCAP in the last 168 hours. D-Dimer: No results for input(s): DDIMER in the last 72 hours. Hgb A1c: No results for input(s): HGBA1C in the last 72 hours. Lipid Profile: No results for input(s): CHOL, HDL, LDLCALC, TRIG, CHOLHDL, LDLDIRECT in the last 72 hours. Thyroid function studies: No results for input(s): TSH, T4TOTAL, T3FREE, THYROIDAB in the last 72 hours.  Invalid input(s): FREET3 Anemia work up: No results for input(s): VITAMINB12, FOLATE, FERRITIN, TIBC, IRON, RETICCTPCT in the last 72 hours. Sepsis Labs: Recent Labs  Lab 03/30/2019 0751 04/07/2019  2344  WBC 4.3 3.5*    Microbiology Recent Results (from the past 240 hour(s))  Respiratory Panel by RT PCR (Flu A&B, Covid) - Nasopharyngeal Swab     Status: None   Collection Time: 03/26/2019 11:28 AM   Specimen: Nasopharyngeal Swab  Result Value Ref Range Status   SARS Coronavirus 2 by RT PCR NEGATIVE NEGATIVE Final    Comment: (NOTE) SARS-CoV-2 target nucleic acids are NOT DETECTED. The SARS-CoV-2 RNA is generally detectable in upper respiratoy specimens during the acute phase of infection. The lowest concentration of SARS-CoV-2 viral copies this assay can detect is 131 copies/mL. A negative result does not preclude SARS-Cov-2 infection and should not be used as the sole basis for treatment or other patient management decisions. A negative result may occur with  improper specimen collection/handling, submission of specimen other than nasopharyngeal swab, presence of viral mutation(s) within the areas targeted by this assay, and inadequate number of viral  copies (<131 copies/mL). A negative result must be combined with clinical observations, patient history, and epidemiological information. The expected result is Negative. Fact Sheet for Patients:  PinkCheek.be Fact Sheet for Healthcare Providers:  GravelBags.it This test is not yet ap proved or cleared by the [place] FDA and  has been authorized for detection and/or diagnosis of SARS-CoV-2 by FDA under an Emergency Use Authorization (EUA). This EUA will remain  in effect (meaning this test can be used) for the duration of the COVID-19 declaration under Section 564(b)(1) of the Act, 21 U.S.C. section 360bbb-3(b)(1), unless the authorization is terminated or revoked sooner.    Influenza A by PCR NEGATIVE NEGATIVE Final   Influenza B by PCR NEGATIVE NEGATIVE Final    Comment: (NOTE) The Xpert Xpress SARS-CoV-2/FLU/RSV assay is intended as an aid in  the diagnosis of influenza from Nasopharyngeal swab specimens and  should not be used as a sole basis for treatment. Nasal washings and  aspirates are unacceptable for Xpert Xpress SARS-CoV-2/FLU/RSV  testing. Fact Sheet for Patients: PinkCheek.be Fact Sheet for Healthcare Providers: GravelBags.it This test is not yet approved or cleared by the [place] FDA and  has been authorized for detection and/or diagnosis of SARS-CoV-2 by  FDA under an Emergency Use Authorization (EUA). This EUA will remain  in effect (meaning this test can be used) for the duration of the  Covid-19 declaration under Section 564(b)(1) of the Act, 21  U.S.C. section 360bbb-3(b)(1), unless the authorization is  terminated or revoked. Performed at [facility], [place] [address], [place], [place] [address]     Procedures and diagnostic studies:  No results found.  Medications:   . amiodarone  100 mg Oral Daily  . atorvastatin  20  mg Oral Once per day on Sun Tue Thu Sat   And  . atorvastatin  40 mg Oral Once per day on Mon Wed Fri  . buPROPion  150 mg Oral Daily  . donepezil  10 mg Oral QHS  . sodium chloride flush  3 mL Intravenous Q12H   Continuous Infusions: . sodium chloride 50 mL/hr at 04/02/19 0453     LOS: 0 days   Radene Gunning NP [facility]   How to contact the [facility] Attending or Consulting provider [place] or covering provider during after hours [place], for this patient?  1. Check the care team in [facility] and look for a) attending/consulting [facility] provider listed and b) the [facility] team listed 2. Log into [email or web address] and use [place]'s universal password  to access. If you do not have the password, please contact the hospital operator. 3. Locate the [facility] provider you are looking for under [facility] and page to a number that you can be directly reached. 4. If you still have difficulty reaching the provider, please page the [facility] (Director on Call) for the Hospitalists listed on amion for assistance.  04/02/2019, 11:21 AM

## 2019-04-02 NOTE — Progress Notes (Signed)
UNASSIGNED PATIENT Subjective: Mr. Vincent Sharp is a 86 year old white male with a history of colon cancer and a polyp removed in 2011 during a colonoscopy (as per his daughter Dia Sitter with who is at the bedside, he has never had a colectomy for his colon cancer). He is followed by Dr. Richmond Campbell for GI care.  He started having bright red bleeding per rectum which prompted his daughter to bring him to the emergency room.  There is no history of nausea vomiting abdominal pain or melena.  He was seen by Dr. Earlean Shawl last month for prolapsing internal hemorrhoids and was treated with topical medication.  His last colonoscopy was reportedly done in 2013 but do not have access to those records at this time. He has a history of colonic polyps diverticulosis and internal hemorrhoids.  He has a history of dementia and was very agitated today trying to get out of bed. His daughter wanted to know when his next colonoscopy will be done in view of his recent hematochezia.  He has had some bloody bowel movements earlier today. His hemoglobin was 12.7 g/dL on admission and dropped to 9.9 to 9.8 g/dL today.   Objective: Vital signs in last 24 hours: Temp:  [97.9 F (36.6 C)-98.4 F (36.9 C)] 97.9 F (36.6 C) (02/08 1124) Pulse Rate:  [51-64] 52 (02/08 1124) Resp:  [10-22] 15 (02/08 0301) BP: (97-109)/(42-95) 108/61 (02/08 1124) SpO2:  [87 %-99 %] 93 % (02/08 1124) Weight:  [104.6 kg] 104.6 kg (02/08 0301) Last BM Date: 04/22/2019  Intake/Output from previous day: 02/07 0701 - 02/08 0700 In: 2328.6 [P.O.:600; I.V.:728.6; IV Piggyback:1000] Out: 1 [Stool:1] Intake/Output this shift: Total I/O In: 570.9 [P.O.:350; I.V.:220.9] Out: 900 [Urine:900]  General appearance: appears stated age, combative, fatigued, morbidly obese, pale and uncooperative Resp: clear to auscultation bilaterally Cardio: regular rate and rhythm, S1, S2 normal, no murmur, click, rub or gallop GI: soft, non-tender; bowel  sounds normal; no masses,  no organomegaly; there is a small palpable lymph node at the umbilicus; ?Sister Wynona Dove node Extremities: extremities normal, atraumatic, no cyanosis or edema  Lab Results: Recent Labs    03/30/2019 0751 03/29/2019 1313 04/10/2019 2344 04/02/19 0257 04/02/19 1131  WBC 4.3  --  3.5*  --  3.9*  HGB 12.7*   < > 9.9* 9.6* 9.8*  HCT 38.1*   < > 28.2* 28.0* 28.3*  PLT 123*  --  120*  --  129*   < > = values in this interval not displayed.   BMET Recent Labs    04/22/2019 0751 04/02/19 1131  NA 143 138  K 3.7 3.6  CL 105 106  CO2 25 26  GLUCOSE 85 117*  BUN 32* 21  CREATININE 1.84* 1.36*  CALCIUM 8.8* 8.2*   LFT Recent Labs    03/27/2019 0751  PROT 5.8*  ALBUMIN 3.4*  AST 29  ALT 22  ALKPHOS 85  BILITOT 1.1   PT/INR Recent Labs    04/19/2019 0751  LABPROT 16.6*  INR 1.4*    Studies/Results: DG CHEST PORT 1 VIEW  Result Date: 04/02/2019 CLINICAL DATA:  Hypoxia. EXAM: PORTABLE CHEST 1 VIEW COMPARISON:  Chest x-ray dated May 16, 2018. FINDINGS: Stable cardiomegaly status post CABG. Atherosclerotic calcification of the aortic arch. Normal pulmonary vascularity. No focal consolidation, pleural effusion, or pneumothorax. No acute osseous abnormality. IMPRESSION: No active disease. Electronically Signed   By: Titus Dubin M.D.   On: 04/02/2019 11:56   Medications: I have reviewed  the patient's current medications.  Assessment/Plan: 1) Rectal bleeding with acute blood loss anemia-diverticulosis and colonic polyps on previous colonoscopy with history of colon cancer none of the polyps removed in the past-I do not feel the patient is in a position to do a colonoscopy prep at this time. As per my discussion with Dr. Deretha Emory plans are to do a CT angiogram if the patient gets tachycardic and hypotensive and shows signs of active GI bleeding in the next few hours. If his baseline condition improves over the next 24 hours and the patient is willing to do a  prep for colonoscopy will be considered. As per my discussion with the patient's daughter I definitely feel he needs a colonoscopy at some point. He may be having a diverticular bleed at present but as he has not had a colonoscopy since 2013.  It is important to rule out recurrent colon cancer. 2) History of paroxysmal atrial fibrillation his Eliquis has been on hold. 3) Chronic diastolic congestive heart failure/hypertension/hyperlipidemia//morbid obesity  LOS: 0 days   Juanita Craver 04/02/2019, 2:17 PM

## 2019-04-02 NOTE — Progress Notes (Signed)
Paged Dr. Collene Mares @ [phone] per family request. Returned call stating she will be seeing patient shortly. Daughter Santiago Glad at the bedside made aware. Verbalized understanding. Leveda Anna, BSN, RN

## 2019-04-03 ENCOUNTER — Inpatient Hospital Stay (HOSPITAL_COMMUNITY): Payer: Medicare PPO

## 2019-04-03 DIAGNOSIS — J9601 Acute respiratory failure with hypoxia: Secondary | ICD-10-CM

## 2019-04-03 DIAGNOSIS — R55 Syncope and collapse: Secondary | ICD-10-CM

## 2019-04-03 DIAGNOSIS — K5791 Diverticulosis of intestine, part unspecified, without perforation or abscess with bleeding: Principal | ICD-10-CM

## 2019-04-03 DIAGNOSIS — K625 Hemorrhage of anus and rectum: Secondary | ICD-10-CM

## 2019-04-03 DIAGNOSIS — I440 Atrioventricular block, first degree: Secondary | ICD-10-CM

## 2019-04-03 LAB — CBC
HCT: 30.3 % — ABNORMAL LOW (ref 39.0–52.0)
HCT: 30.5 % — ABNORMAL LOW (ref 39.0–52.0)
Hemoglobin: 10.7 g/dL — ABNORMAL LOW (ref 13.0–17.0)
Hemoglobin: 10.6 g/dL — ABNORMAL LOW (ref 13.0–17.0)
MCH: 32.4 pg (ref 26.0–34.0)
MCH: 32.7 pg (ref 26.0–34.0)
MCHC: 35.3 g/dL (ref 30.0–36.0)
MCHC: 34.8 g/dL (ref 30.0–36.0)
MCV: 91.8 fL (ref 80.0–100.0)
MCV: 94.1 fL (ref 80.0–100.0)
Platelets: 125 10*3/uL — ABNORMAL LOW (ref 150–400)
Platelets: 132 10*3/uL — ABNORMAL LOW (ref 150–400)
RBC: 3.3 MIL/uL — ABNORMAL LOW (ref 4.22–5.81)
RBC: 3.24 MIL/uL — ABNORMAL LOW (ref 4.22–5.81)
RDW: 12.4 % (ref 11.5–15.5)
RDW: 12.7 % (ref 11.5–15.5)
WBC: 5.9 10*3/uL (ref 4.0–10.5)
WBC: 6.8 10*3/uL (ref 4.0–10.5)
nRBC: 0 % (ref 0.0–0.2)
nRBC: 0 % (ref 0.0–0.2)

## 2019-04-03 LAB — RETICULOCYTES
Immature Retic Fract: 13.6 % (ref 2.3–15.9)
RBC.: 3.31 MIL/uL — ABNORMAL LOW (ref 4.22–5.81)
Retic Count, Absolute: 71.5 10*3/uL (ref 19.0–186.0)
Retic Ct Pct: 2.2 % (ref 0.4–3.1)

## 2019-04-03 LAB — TROPONIN I (HIGH SENSITIVITY)
Troponin I (High Sensitivity): 199 ng/L (ref <18)
Troponin I (High Sensitivity): 243 ng/L (ref <18)

## 2019-04-03 LAB — BASIC METABOLIC PANEL
Anion gap: 11 (ref 5–15)
BUN: 14 mg/dL (ref 8–23)
CO2: 22 mmol/L (ref 22–32)
Calcium: 8.8 mg/dL — ABNORMAL LOW (ref 8.9–10.3)
Chloride: 110 mmol/L (ref 98–111)
Creatinine, Ser: 1.23 mg/dL (ref 0.61–1.24)
GFR calc Af Amer: >60 mL/min (ref >60)
GFR calc non Af Amer: 54 mL/min — ABNORMAL LOW (ref >60)
Glucose, Bld: 122 mg/dL — ABNORMAL HIGH (ref 70–99)
Potassium: 3.9 mmol/L (ref 3.5–5.1)
Sodium: 143 mmol/L (ref 135–145)

## 2019-04-03 LAB — VITAMIN B12: Vitamin B-12: 885 pg/mL (ref 180–914)

## 2019-04-03 LAB — BLOOD GAS, ARTERIAL
Acid-base deficit: 0.7 mmol/L (ref 0.0–2.0)
Bicarbonate: 23.1 mmol/L (ref 20.0–28.0)
FIO2: 36
O2 Saturation: 92.5 %
Patient temperature: 37
pCO2 arterial: 35.9 mmHg (ref 32.0–48.0)
pH, Arterial: 7.424 (ref 7.350–7.450)
pO2, Arterial: 65.8 mmHg — ABNORMAL LOW (ref 83.0–108.0)

## 2019-04-03 LAB — HEMOGLOBIN AND HEMATOCRIT, BLOOD
HCT: 30 % — ABNORMAL LOW (ref 39.0–52.0)
Hemoglobin: 10.6 g/dL — ABNORMAL LOW (ref 13.0–17.0)

## 2019-04-03 LAB — IRON AND TIBC
Iron: 33 ug/dL — ABNORMAL LOW (ref 45–182)
Saturation Ratios: 11 % — ABNORMAL LOW (ref 17.9–39.5)
TIBC: 287 ug/dL (ref 250–450)
UIBC: 254 ug/dL

## 2019-04-03 LAB — GLUCOSE, CAPILLARY
Glucose-Capillary: 146 mg/dL — ABNORMAL HIGH (ref 70–99)
Glucose-Capillary: 123 mg/dL — ABNORMAL HIGH (ref 70–99)

## 2019-04-03 LAB — T4, FREE: Free T4: 1.53 ng/dL — ABNORMAL HIGH (ref 0.61–1.12)

## 2019-04-03 LAB — FOLATE: Folate: 35.9 ng/mL (ref >5.9)

## 2019-04-03 LAB — AMMONIA: Ammonia: 22 umol/L (ref 9–35)

## 2019-04-03 LAB — FERRITIN: Ferritin: 84 ng/mL (ref 24–336)

## 2019-04-03 LAB — BRAIN NATRIURETIC PEPTIDE: B Natriuretic Peptide: 849.2 pg/mL — ABNORMAL HIGH (ref 0.0–100.0)

## 2019-04-03 LAB — MAGNESIUM: Magnesium: 1.7 mg/dL (ref 1.7–2.4)

## 2019-04-03 MED ORDER — KCL IN DEXTROSE-NACL 10-5-0.45 MEQ/L-%-% IV SOLN
INTRAVENOUS | Status: DC
  Filled 2019-04-03 (×3): qty 1000

## 2019-04-03 MED ORDER — HALOPERIDOL LACTATE 5 MG/ML IJ SOLN: 5.0000 mg | INTRAMUSCULAR | Status: DC | PRN

## 2019-04-03 MED ORDER — QUETIAPINE FUMARATE 25 MG PO TABS
25.0000 mg | ORAL_TABLET | Freq: Every day | ORAL | Status: DC
  Filled 2019-04-03: qty 1

## 2019-04-03 MED ORDER — FUROSEMIDE 40 MG PO TABS: 40.0000 mg | ORAL_TABLET | Freq: Every day | ORAL | 12 refills | Status: AC | PRN

## 2019-04-03 MED ORDER — METOPROLOL TARTRATE 5 MG/5ML IV SOLN
2.5000 mg | Freq: Four times a day (QID) | INTRAVENOUS | Status: DC
  Administered 2019-04-03 – 2019-04-04 (×3): 2.5 mg via INTRAVENOUS
  Filled 2019-04-03 (×3): qty 5

## 2019-04-03 MED ORDER — MAGNESIUM SULFATE 2 GM/50ML IV SOLN
2.0000 g | Freq: Once | INTRAVENOUS | Status: AC
  Administered 2019-04-03: 2 g via INTRAVENOUS
  Filled 2019-04-03: qty 50

## 2019-04-03 MED ORDER — HALOPERIDOL 5 MG PO TABS
5.0000 mg | ORAL_TABLET | ORAL | Status: DC | PRN
  Filled 2019-04-03: qty 1

## 2019-04-03 NOTE — Evaluation (Signed)
Clinical/Bedside Swallow Evaluation Patient Details  Name: Vincent Sharp MRN: [id number] Date of Birth: 31-Jan-1933  Today's Date: 04/03/2019 Time: SLP Start Time (ACUTE ONLY): 1450 SLP Stop Time (ACUTE ONLY): 1509 SLP Time Calculation (min) (ACUTE ONLY): 19 min  Past Medical History:  Past Medical History:  Diagnosis Date  . Anxiety   . Arthritis   . CAD (coronary artery disease), native coronary artery 12/26/2013   CABG with LIMA to LAD SVG to OM, SVG to dx1 1992 Dr. Redmond Pulling Cath 2009 scattered irregularities Left main, occluded LAD, occluded CFX, 40% stenosis proximal RCA, widely patent OM 1 SVG, widely patent LAD LIMA graft and fills first diagonal branch but with stenosis between mammary insertion site and diagonal branch, occluded Diag 1 SVG   . CHF (congestive heart failure) ([place]) 03/2016  . Colon cancer ([place])    skin, colon  . Complex sleep apnea syndrome 05/18/2013  . Coronary artery disease   . Depression   . Essential hypertension 12/26/2013  . Headache   . History of colon cancer    Treated with partial colectomy   . Hyperlipidemia   . Hypertension   . Lazy eye, left   . Obesity (BMI 30-39.9)   . Obesity hypoventilation syndrome ([place])   . OSA on CPAP   . PAF (paroxysmal atrial fibrillation) ([place])   . Pneumonia    back in 2015  . Spinal stenosis in cervical region 12/11/2012   Past Surgical History:  Past Surgical History:  Procedure Laterality Date  . CARDIOVERSION N/A 04/13/2016   Procedure: CARDIOVERSION;  Surgeon: Jacolyn Reedy, MD;  Location: [facility] ENDOSCOPY;  Service: Cardiovascular;  Laterality: N/A;  . CARDIOVERSION N/A 05/17/2016   Procedure: CARDIOVERSION;  Surgeon: Jacolyn Reedy, MD;  Location: [place];  Service: Cardiovascular;  Laterality: N/A;  . CATARACT EXTRACTION Bilateral   . CORONARY ARTERY BYPASS GRAFT  1992   x3, Dr Redmond Pulling surgeon,  Dr Wynonia Lawman cardiologist  . EYE SURGERY    . HEMORRHOID SURGERY    . HERNIA REPAIR     right inguinal   . HYDROCELE EXCISION / REPAIR    . INGUINAL HERNIA REPAIR Left 10/08/2016   Procedure: LEFT INGUINAL HERNIA REPAIR WITH MESH;  Surgeon: Jackolyn Confer, MD;  Location: [place];  Service: General;  Laterality: Left;  TAP BLOCK  AND LMA  . INSERTION OF MESH Left 10/08/2016   Procedure: INSERTION OF MESH;  Surgeon: Jackolyn Confer, MD;  Location: [place];  Service: General;  Laterality: Left;  . KNEE ARTHROSCOPY W/ MENISCAL REPAIR Right   . PARTIAL COLECTOMY    . POLYPECTOMY  1989  . SHOULDER ARTHROSCOPY WITH ROTATOR CUFF REPAIR AND SUBACROMIAL DECOMPRESSION Right 12/27/2013   Procedure: RIGHT SHOULDER ARTHROSCOPY SUBACROMIAL DECOMPRESSION DISTAL CLAVICLE RESECTION AND ROTATOR CUFF REPAIR, BISCEPS TENOTOMY;  Surgeon: Marin Shutter, MD;  Location: [place];  Service: Orthopedics;  Laterality: Right;  . TONSILLECTOMY     HPI:  86 year old male with history of advanced dementia, CAD/CABG, chronic diastolic CHF, OSA not on CPAP, paroxysmal A. fib on Eliquis, colon cancer/polyp s/p polypectomy, internal hemorrhoids, HTN and HLD presenting with hematochezia and orthostatic lightheadedness. (2/9) patient had an episode of what looks like vasovagal syncope after he got off bedside commode; very lathargic following event.   Assessment / Plan / Recommendation Clinical Impression   Pt very drowsy and lethargic upon arrival, with daughter at bedside. Pt was difficult to arouse and keep awake t/o session, requiring Max cues. Daughter reported pt is not  typically this lethargic, and is able to stay awake for longer periods of time. Pt observed with wet oral swab, ice chips and thin liquids. Pt observed with decreased AP transit, multiple swallows, immediate and delayed throat clearing with ice chips and thin liquids. Pt also observed with x1 immediate cough following thin liquid trial. Considering drowsy state and s/sx of aspiration, recommend continued NPO, but may have swabs of water following oral care.   SLP Visit  Diagnosis: Dysphagia, oropharyngeal phase (R13.12)    Aspiration Risk  Moderate aspiration risk;Mild aspiration risk    Diet Recommendation NPO;Ice chips PRN after oral care   Medication Administration: Via alternative means Postural Changes: Seated upright at 90 degrees    Other  Recommendations Oral Care Recommendations: Oral care prior to ice chip/H20 Other Recommendations: Have oral suction available   Follow up Recommendations 24 hour supervision/assistance      Frequency and Duration min 2x/week  2 weeks       Prognosis Prognosis for Safe Diet Advancement: Fair Barriers to Reach Goals: Cognitive deficits      Swallow Study   General Date of Onset: 04/21/2019 HPI: 86 year old male with history of advanced dementia, CAD/CABG, chronic diastolic CHF, OSA not on CPAP, paroxysmal A. fib on Eliquis, colon cancer/polyp s/p polypectomy, internal hemorrhoids, HTN and HLD presenting with hematochezia and orthostatic lightheadedness. Patient had an episode of what looks like vasovagal syncope after he got off bedside commode.; very lathargic following event. Type of Study: Bedside Swallow Evaluation Previous Swallow Assessment: 2016; WFL Diet Prior to this Study: NPO Temperature Spikes Noted: No Respiratory Status: Nasal cannula History of Recent Intubation: No Behavior/Cognition: Lethargic/Drowsy Oral Cavity Assessment: Within Functional Limits Oral Care Completed by SLP: Recent completion by staff Oral Cavity - Dentition: Missing dentition Self-Feeding Abilities: Needs assist Patient Positioning: Upright in bed Baseline Vocal Quality: Normal Volitional Swallow: Able to elicit    Oral/Motor/Sensory Function Overall Oral Motor/Sensory Function: Within functional limits   Ice Chips Ice chips: Impaired Presentation: Spoon Oral Phase Functional Implications: Prolonged oral transit Pharyngeal Phase Impairments: Throat Clearing - Immediate;Multiple swallows   Thin Liquid Thin  Liquid: Impaired Presentation: Spoon Oral Phase Functional Implications: Prolonged oral transit Pharyngeal  Phase Impairments: Throat Clearing - Immediate;Cough - Delayed;Multiple swallows    Nectar Thick Nectar Thick Liquid: Not tested   Honey Thick Honey Thick Liquid: Not tested   Puree Puree: Not tested   Solid     Solid: Not tested     Aline August, Student SLP Office: [phone]  04/03/2019,3:23 PM

## 2019-04-03 NOTE — Progress Notes (Addendum)
Patient's daughter wanting to take patient home stating his confusion is only getting worse in the hospital.  Stated she would give medications at home.  Dr. Cyndia Skeeters notified, stated he would come see patient with plan of discharging him.

## 2019-04-03 NOTE — Progress Notes (Signed)
CRITICAL VALUE ALERT  Critical Value:  TROPONIN=199  Date & Time Notied:   04/03/2019 AT [id number]  Provider Notified:  DR Cyndia Skeeters  Orders Received/Actions taken:  NONE

## 2019-04-03 NOTE — Discharge Instructions (Signed)

## 2019-04-03 NOTE — Significant Event (Signed)
Rapid Response Event Note  Overview: Time Called: 1019 Arrival Time: 1025 Event Type: Other (Comment)(Pt up to bathroom and had bloody BM. Now snorous in chair, hard to arouse.) VS: 91/56 (68), HR 82, RR 14, SpO2 100% on NRB  Initial Focused Assessment: Pt lying in bed with loud, snoring respirations. Lung sounds are clear, diminished. Pt soiled with bloody BM. Pt awakens to painful stimuli. Oral mucous membranes are dry, gag reflex is weak. Pt is cool and clammy.   Interventions: Dr. Cyndia Skeeters at bedside, orders received for: -CBC, Ammonia, Mg levels -EKG completed and reviewed by Dr. Cyndia Skeeters -CBG 146  -Oxygen weaned down from NRB to [place] (if not transferred): -Continue to wean down supplemental oxygen. Keep sats >92%. -Follow-up with attending regarding results of imaging and labs ordered. -Bedrest, high fall risk -Oral care -Monitor VS for acute change r/t GIB/volume loss  Call rapid response for further needs.  Event Summary: Name of Physician Notified: Dr. Cyndia Skeeters at 1030  Outcome: Stayed in room and stabalized  Event End Time: 1105   Vincent Sharp's daughter is at bedside and updated on current plan of care and interventions.  Vincent Sharp

## 2019-04-03 NOTE — Progress Notes (Signed)
Patient was being processed for discharge.  He was helped to bathroom by Nay NT and daughter.  Patient helped to wheelchair.  Daughter states that patient's eyes rolled back in head and patient started drooling.  Patient assisted back to bed.  Patient unresponsive. Dr. Cyndia Skeeters and Rapid response notified.  Vitals obtained.  CBG=146 at 1017.    Telemetry reapplied.  2 IVs started.    Due to lethargy po medications held.  DCyndia Skeeters aware.

## 2019-04-03 NOTE — Progress Notes (Signed)
PROGRESS NOTE  Vincent Sharp [id number] DOB: [birth year]   PCP: Burnard Bunting, MD  Patient is from: Home.  Independently ambulates at baseline.  DOA: 04/21/2019 LOS: 1  Brief Narrative / Interim history: 86 year old male with history of advanced dementia, CAD/CABG, chronic diastolic CHF, OSA not on CPAP, paroxysmal A. fib on Eliquis, colon cancer/polyp s/p polypectomy, internal hemorrhoids, HTN and HLD presenting with hematochezia and orthostatic lightheadedness.  Denies history of NSAID use.  In ED, soft blood pressures.  Hgb 12.7.  Platelet 123.  Creatinine 1.84.  BUN 32.  INR 1.4.  Typed and screened.  GI consulted and admitted for hematochezia.  Eliquis held.  Hemoglobin dropped to 9.6 and improved to 10.7, and remained stable.  Patient was evaluated by GI.  Bleeding thought to be diverticular.   Hospital course complicated by agitation/delirium in the setting of advanced dementia.  Subjective: Patient had agitation and delirium overnight requiring as needed Ativan and Haldol.  He was somewhat drowsy and confused but not agitated.  Not able to recognize his daughter that he thinks she is his wife.  Patient's daughter concerned about his delirium and confusion are requested to take home and follow-up with his gastroenterologist outpatient.  She believes patient would do well if he is at home with familiar faces around.  Discharge order placed. However, patient had an episode of what looks like vasovagal syncope after he got off bedside commode.  Reportedly, there was a small smear of bloody stool as well.  He was off cardiac monitor at the time of the incident.  Vital signs are stable except for BP of 91/56.  EKG with first-degree AVB but no acute ischemic finding.  QTC 522 but with wide QRS.  Objective: Vitals:   04/02/19 1700 04/02/19 2032 04/02/19 2106 04/03/19 0638  BP: 105/67  (!) 143/67 122/89  Pulse:  75 85   Resp:  18 11 (!) 31  Temp: (!) 97.5 F (36.4 C)  (!) 97.5 F  (36.4 C)   TempSrc: Oral  Oral   SpO2:  94%    Weight:    103.9 kg  Height:        Intake/Output Summary (Last 24 hours) at 04/03/2019 1233 Last data filed at 04/03/2019 0640 Gross per 24 hour  Intake 903.07 ml  Output 2300 ml  Net -1396.93 ml   Filed Weights   03/31/2019 0735 04/02/19 0301 04/03/19 [id number]  Weight: 113.4 kg 104.6 kg 103.9 kg    Examination:  GENERAL: Drowsy and confused.  Follows commands. HEENT: MMM.  Vision and hearing grossly intact.  NECK: Supple.  No apparent JVD.  RESP:  No IWOB. Good air movement bilaterally. CVS:  RRR. Heart sounds normal.  ABD/GI/GU: Bowel sounds present. Soft. Non tender.  MSK/EXT:  Moves extremities. No apparent deformity. No edema.  SKIN: no apparent skin lesion or wound NEURO: Drowsy but follows commands. Confused.  Speech slurred.  No facial asymmetry.  Motor strength symmetric in all extremities. PSYCH: Confused.  Procedures:  None  Assessment & Plan: Acute blood loss anemia due to hematochezia: Likely from diverticulosis and hemorrhoids.  Hgb 12.7 (admit)>> 9.6>> 10.7> 10.6.  -Appreciate GI recommendations  -Outpatient colonoscopy with Dr. Earlean Shawl, primary GI  -CTA abdomen and pelvis if he acutely decompensates with hematochezia -Continue holding Eliquis-discussed risk and benefit with patient's daughter at bedside. -Monitor H&H  -Secure two PIV's -N.p.o. pending SLP eval -D5-1/2NS-KCl at 75 cc an hour while n.p.o.  Vasovagal syncope: patient had what looks like a syncopal  episode when he got off bedside commode and went back to bed.  Hemodynamically stable.  Was off telemetry monitoring during this event.  No apparent new focal neuro deficit.  CXR, EKG, CBC, ABG and ammonia reassuring.  EKG and troponin pending. -Continue monitoring on telemetry -Follow troponin and magnesium level -Recheck H&H in the afternoon  Acute metabolic encephalopathy/delirium in patient with advanced dementia: he is drowsy and confused.  Last  Haldol dose yesterday about 4:30 PM.  Received 1 mg IV Ativan about 9 PM last night.  ABG and ammonia level reassuring.  No signs of infection.  No focal neuro deficit.  Doubt seizure. -Frequent reorientation's and delirium precautions -Avoid sedating medications -Try Seroquel 25 mg at bedtime -As needed IM or p.o. Haldol -Discontinue Aricept in the setting of first-degree AV block.  Paroxysmal A. fib/first-degree AVB: He is on Bystolic and Aricept which could contribute to first-degree AVB. -Continue amiodarone and Bystolic when able to swallow -Discontinue Aricept-doubt benefit at this stage. -IV metoprolol 2.5 mg as needed HR >120 -Continue holding Eliquis-until follow up with primary GI outpatient-we discussed risk and benefits including the stroke  Chronic diastolic CHF: Echo in [id number] with EF of 55 to 60% and PAPP to 39.Marland Kitchen  Appears euvolemic.  On Lasix at home? -Hold Lasix. -Gentle IV fluid as above -Monitor fluid status  OSA not on CPAP: ABG reassuring. -Minimal oxygen to keep saturation greater than 90%.                   DVT prophylaxis: SCD in the setting of GI bleed Code Status: Full code Family Communication: Updated patient's daughter at bedside.  Discharge barrier: GI bleed, encephalopathy and syncope Patient is from: Home Final disposition: Home with family.  Consultants: GI   Microbiology summarized: Influenza PCR negative. COVID-19 negative.  Sch Meds:  Scheduled Meds: . amiodarone  100 mg Oral Daily  . atorvastatin  20 mg Oral Once per day on Sun Tue Thu Sat   And  . atorvastatin  40 mg Oral Once per day on Mon Wed Fri  . buPROPion  150 mg Oral Daily  . donepezil  10 mg Oral QHS  . sodium chloride flush  3 mL Intravenous Q12H   Continuous Infusions: . sodium chloride 50 mL/hr at 04/03/19 0050   PRN Meds:.acetaminophen **OR** acetaminophen, albuterol, ondansetron **OR** ondansetron (ZOFRAN) IV  Antimicrobials: Anti-infectives (From  admission, onward)   None       I have personally reviewed the following labs and images: CBC: Recent Labs  Lab 04/06/2019 0751 04/18/2019 1313 03/26/2019 2344 04/18/2019 2344 04/02/19 0257 04/02/19 1131 04/02/19 2152 04/03/19 0338 04/03/19 1123  WBC 4.3  --  3.5*  --   --  3.9*  --  5.9 6.8  NEUTROABS 2.7  --   --   --   --   --   --   --   --   HGB 12.7*   < > 9.9*   < > 9.6* 9.8* 11.2* 10.7* 10.6*  HCT 38.1*   < > 28.2*   < > 28.0* 28.3* 32.1* 30.3* 30.5*  MCV 97.2  --  92.5  --   --  94.3  --  91.8 94.1  PLT 123*  --  120*  --   --  129*  --  125* 132*   < > = values in this interval not displayed.   BMP &GFR Recent Labs  Lab 04/15/2019 0751 04/02/19 1131 04/03/19 0338  NA 143  138 143  K 3.7 3.6 3.9  CL 105 106 110  CO2 25 26 22   GLUCOSE 85 117* 122*  BUN 32* 21 14  CREATININE 1.84* 1.36* 1.23  CALCIUM 8.8* 8.2* 8.8*   Estimated Creatinine Clearance: 55.7 mL/min (by C-G formula based on SCr of 1.23 mg/dL). Liver & Pancreas: Recent Labs  Lab 03/30/2019 0751  AST 29  ALT 22  ALKPHOS 85  BILITOT 1.1  PROT 5.8*  ALBUMIN 3.4*   No results for input(s): LIPASE, AMYLASE in the last 168 hours. Recent Labs  Lab 04/03/19 1123  AMMONIA 22   Diabetic: No results for input(s): HGBA1C in the last 72 hours. Recent Labs  Lab 04/03/19 1017  GLUCAP 146*   Cardiac Enzymes: No results for input(s): CKTOTAL, CKMB, CKMBINDEX, TROPONINI in the last 168 hours. No results for input(s): PROBNP in the last 8760 hours. Coagulation Profile: Recent Labs  Lab 04/03/2019 0751  INR 1.4*   Thyroid Function Tests: Recent Labs    04/02/19 1131  TSH 0.334*   Lipid Profile: No results for input(s): CHOL, HDL, LDLCALC, TRIG, CHOLHDL, LDLDIRECT in the last 72 hours. Anemia Panel: Recent Labs    04/02/19 1131  VITAMINB12 506  FOLATE 35.6  FERRITIN 87  TIBC 242*  IRON 64  RETICCTPCT 1.6   Urine analysis:    Component Value Date/Time   COLORURINE YELLOW 04/11/2016 1236    APPEARANCEUR CLEAR 04/11/2016 1236   LABSPEC 1.013 04/11/2016 1236   PHURINE 5.0 04/11/2016 1236   GLUCOSEU NEGATIVE 04/11/2016 1236   HGBUR NEGATIVE 04/11/2016 1236   BILIRUBINUR NEGATIVE 04/11/2016 1236   KETONESUR NEGATIVE 04/11/2016 1236   PROTEINUR NEGATIVE 04/11/2016 1236   UROBILINOGEN 4.0 (H) 08/27/2014 0353   NITRITE NEGATIVE 04/11/2016 1236   LEUKOCYTESUR NEGATIVE 04/11/2016 1236   Sepsis Labs: Invalid input(s): PROCALCITONIN, [facility]  Microbiology: Recent Results (from the past 240 hour(s))  Respiratory Panel by RT PCR (Flu A&B, Covid) - Nasopharyngeal Swab     Status: None   Collection Time: 04/02/2019 11:28 AM   Specimen: Nasopharyngeal Swab  Result Value Ref Range Status   SARS Coronavirus 2 by RT PCR NEGATIVE NEGATIVE Final    Comment: (NOTE) SARS-CoV-2 target nucleic acids are NOT DETECTED. The SARS-CoV-2 RNA is generally detectable in upper respiratoy specimens during the acute phase of infection. The lowest concentration of SARS-CoV-2 viral copies this assay can detect is 131 copies/mL. A negative result does not preclude SARS-Cov-2 infection and should not be used as the sole basis for treatment or other patient management decisions. A negative result may occur with  improper specimen collection/handling, submission of specimen other than nasopharyngeal swab, presence of viral mutation(s) within the areas targeted by this assay, and inadequate number of viral copies (<131 copies/mL). A negative result must be combined with clinical observations, patient history, and epidemiological information. The expected result is Negative. Fact Sheet for Patients:  PinkCheek.be Fact Sheet for Healthcare Providers:  GravelBags.it This test is not yet ap proved or cleared by the [place] FDA and  has been authorized for detection and/or diagnosis of SARS-CoV-2 by FDA under an Emergency Use Authorization  (EUA). This EUA will remain  in effect (meaning this test can be used) for the duration of the COVID-19 declaration under Section 564(b)(1) of the Act, 21 U.S.C. section 360bbb-3(b)(1), unless the authorization is terminated or revoked sooner.    Influenza A by PCR NEGATIVE NEGATIVE Final   Influenza B by PCR NEGATIVE NEGATIVE Final    Comment: (  NOTE) The Xpert Xpress SARS-CoV-2/FLU/RSV assay is intended as an aid in  the diagnosis of influenza from Nasopharyngeal swab specimens and  should not be used as a sole basis for treatment. Nasal washings and  aspirates are unacceptable for Xpert Xpress SARS-CoV-2/FLU/RSV  testing. Fact Sheet for Patients: PinkCheek.be Fact Sheet for Healthcare Providers: GravelBags.it This test is not yet approved or cleared by the [place] FDA and  has been authorized for detection and/or diagnosis of SARS-CoV-2 by  FDA under an Emergency Use Authorization (EUA). This EUA will remain  in effect (meaning this test can be used) for the duration of the  Covid-19 declaration under Section 564(b)(1) of the Act, 21  U.S.C. section 360bbb-3(b)(1), unless the authorization is  terminated or revoked. Performed at [facility], [place] [address], [place], [place] [address]     Radiology Studies: DG Chest Port 1 View  Result Date: 04/03/2019 CLINICAL DATA:  SOB (shortness of breath),hx chf EXAM: PORTABLE CHEST - 1 VIEW COMPARISON:  04/02/2019 FINDINGS: Lungs are clear. Stable cardiomegaly. Previous CABG. Aortic Atherosclerosis (ICD10-170.0). No effusion. No pneumothorax. Sternotomy wires. IMPRESSION: Stable cardiomegaly. No acute disease. Electronically Signed   By: Lucrezia Europe M.D.   On: 04/03/2019 11:02   45 minutes with more than 50% spent in reviewing records, counseling patient/family and coordinating care.   Tarus Briski T. [place]  If 7PM-7AM, please contact  night-coverage [email or web address] Password [facility] 04/03/2019, 12:33 PM

## 2019-04-03 NOTE — Progress Notes (Signed)
Subjective: Agitated.  Objective: Vital signs in last 24 hours: Temp:  [97.5 F (36.4 C)-98.1 F (36.7 C)] 97.5 F (36.4 C) (02/08 2106) Pulse Rate:  [52-85] 85 (02/08 2106) Resp:  [11-31] 31 (02/09 [id number]) BP: (105-143)/(52-89) 122/89 (02/09 [id number]) SpO2:  [87 %-94 %] 94 % (02/08 2032) Weight:  [103.9 kg] 103.9 kg (02/09 0638) Last BM Date: 04/08/2019  Intake/Output from previous day: 02/08 0701 - 02/09 0700 In: 1473.9 [P.O.:600; I.V.:873.9] Out: 2300 [Urine:2300] Intake/Output this shift: No intake/output data recorded.  General appearance: agitated Extremities: thrashing about in mittens  Lab Results: Recent Labs    04/11/2019 2344 04/02/19 0257 04/02/19 1131 04/02/19 2152 04/03/19 0338  WBC 3.5*  --  3.9*  --  5.9  HGB 9.9*   < > 9.8* 11.2* 10.7*  HCT 28.2*   < > 28.3* 32.1* 30.3*  PLT 120*  --  129*  --  125*   < > = values in this interval not displayed.   BMET Recent Labs    04/22/2019 0751 04/02/19 1131 04/03/19 0338  NA 143 138 143  K 3.7 3.6 3.9  CL 105 106 110  CO2 25 26 22   GLUCOSE 85 117* 122*  BUN 32* 21 14  CREATININE 1.84* 1.36* 1.23  CALCIUM 8.8* 8.2* 8.8*   LFT Recent Labs    03/26/2019 0751  PROT 5.8*  ALBUMIN 3.4*  AST 29  ALT 22  ALKPHOS 85  BILITOT 1.1   PT/INR Recent Labs    04/22/2019 0751  LABPROT 16.6*  INR 1.4*   Hepatitis Panel No results for input(s): HEPBSAG, HCVAB, HEPAIGM, HEPBIGM in the last 72 hours. C-Diff No results for input(s): CDIFFTOX in the last 72 hours. Fecal Lactopherrin No results for input(s): FECLLACTOFRN in the last 72 hours.  Studies/Results: DG CHEST PORT 1 VIEW  Result Date: 04/02/2019 CLINICAL DATA:  Hypoxia. EXAM: PORTABLE CHEST 1 VIEW COMPARISON:  Chest x-ray dated May 16, 2018. FINDINGS: Stable cardiomegaly status post CABG. Atherosclerotic calcification of the aortic arch. Normal pulmonary vascularity. No focal consolidation, pleural effusion, or pneumothorax. No acute osseous abnormality.  IMPRESSION: No active disease. Electronically Signed   By: Titus Dubin M.D.   On: 04/02/2019 11:56    Medications:  Scheduled: . amiodarone  100 mg Oral Daily  . atorvastatin  20 mg Oral Once per day on Sun Tue Thu Sat   And  . atorvastatin  40 mg Oral Once per day on Mon Wed Fri  . buPROPion  150 mg Oral Daily  . donepezil  10 mg Oral QHS  . sodium chloride flush  3 mL Intravenous Q12H   Continuous: . sodium chloride 50 mL/hr at 04/03/19 0050    Assessment/Plan: 1) Hematochezia. 2) Anemia. 3) Dementia with severe agitation.   The patient's daughter reports that her father's agitation is even worse.  It typically worsens in the hospital.  There are no further reports of hematochezia and his HGB is relatively stable.  It is unlikely that he will be in a position to tolerate a prep and undergo a colonoscopy.  Presuming that this is a diverticular bleed, the hope is that it will stop spontaneously.  He can undergo an outpatient colonoscopy with Dr. Earlean Shawl, his primary GI.  His daughter feels that his mental status will greatly improve if he can be discharged home safely.  I agree with Dr. Collene Mares in that a CT angio is recommended if he has an acute decompensation with hematochezia.  LOS: 1 day  Vicy Medico D 04/03/2019, 7:19 AM

## 2019-04-04 ENCOUNTER — Encounter (HOSPITAL_COMMUNITY): Payer: Self-pay | Admitting: Family Medicine

## 2019-04-04 DIAGNOSIS — R946 Abnormal results of thyroid function studies: Secondary | ICD-10-CM

## 2019-04-04 DIAGNOSIS — R778 Other specified abnormalities of plasma proteins: Secondary | ICD-10-CM

## 2019-04-04 DIAGNOSIS — Z7189 Other specified counseling: Secondary | ICD-10-CM

## 2019-04-04 LAB — RENAL FUNCTION PANEL
Albumin: 3.3 g/dL — ABNORMAL LOW (ref 3.5–5.0)
Anion gap: 8 (ref 5–15)
BUN: 8 mg/dL (ref 8–23)
CO2: 26 mmol/L (ref 22–32)
Calcium: 8.7 mg/dL — ABNORMAL LOW (ref 8.9–10.3)
Chloride: 108 mmol/L (ref 98–111)
Creatinine, Ser: 1.07 mg/dL (ref 0.61–1.24)
GFR calc Af Amer: >60 mL/min (ref >60)
GFR calc non Af Amer: >60 mL/min (ref >60)
Glucose, Bld: 142 mg/dL — ABNORMAL HIGH (ref 70–99)
Phosphorus: 3.2 mg/dL (ref 2.5–4.6)
Potassium: 3.7 mmol/L (ref 3.5–5.1)
Sodium: 142 mmol/L (ref 135–145)

## 2019-04-04 LAB — HEMOGLOBIN AND HEMATOCRIT, BLOOD
HCT: 31.5 % — ABNORMAL LOW (ref 39.0–52.0)
Hemoglobin: 11.1 g/dL — ABNORMAL LOW (ref 13.0–17.0)

## 2019-04-04 LAB — MAGNESIUM: Magnesium: 1.8 mg/dL (ref 1.7–2.4)

## 2019-04-04 LAB — PROTIME-INR
INR: 1.1 (ref 0.8–1.2)
Prothrombin Time: 14.3 seconds (ref 11.4–15.2)

## 2019-04-04 LAB — TROPONIN I (HIGH SENSITIVITY)
Troponin I (High Sensitivity): 358 ng/L (ref <18)
Troponin I (High Sensitivity): 364 ng/L (ref <18)

## 2019-04-04 MED ORDER — HALOPERIDOL 0.5 MG PO TABS
0.5000 mg | ORAL_TABLET | ORAL | Status: DC | PRN
  Filled 2019-04-04: qty 1

## 2019-04-04 MED ORDER — SODIUM CHLORIDE 0.9 % IV SOLN
12.5000 mg | Freq: Four times a day (QID) | INTRAVENOUS | Status: DC | PRN
  Filled 2019-04-04: qty 0.5

## 2019-04-04 MED ORDER — ACETAMINOPHEN 650 MG RE SUPP: 650.0000 mg | Freq: Four times a day (QID) | RECTAL | Status: DC | PRN

## 2019-04-04 MED ORDER — LORAZEPAM 2 MG/ML IJ SOLN
1.0000 mg | Freq: Once | INTRAMUSCULAR | Status: AC
  Administered 2019-04-04: 16:00:00 1 mg via INTRAVENOUS
  Filled 2019-04-04: qty 1

## 2019-04-04 MED ORDER — ACETAMINOPHEN 325 MG PO TABS: 650.0000 mg | ORAL_TABLET | Freq: Four times a day (QID) | ORAL | Status: DC | PRN

## 2019-04-04 MED ORDER — LORAZEPAM 2 MG/ML PO CONC: 1.0000 mg | ORAL | Status: DC | PRN

## 2019-04-04 MED ORDER — LORAZEPAM 2 MG/ML IJ SOLN
1.0000 mg | INTRAMUSCULAR | Status: DC | PRN
  Administered 2019-04-05: 1 mg via INTRAVENOUS
  Filled 2019-04-04 (×2): qty 1

## 2019-04-04 MED ORDER — BENZTROPINE MESYLATE 1 MG PO TABS
1.0000 mg | ORAL_TABLET | Freq: Two times a day (BID) | ORAL | Status: DC
  Filled 2019-04-04 (×2): qty 1

## 2019-04-04 MED ORDER — LORAZEPAM 2 MG/ML IJ SOLN: 1.0000 mg | INTRAMUSCULAR | Status: DC | PRN

## 2019-04-04 MED ORDER — HALOPERIDOL LACTATE 5 MG/ML IJ SOLN: 0.5000 mg | INTRAMUSCULAR | Status: DC | PRN

## 2019-04-04 MED ORDER — HALOPERIDOL LACTATE 2 MG/ML PO CONC
0.5000 mg | ORAL | Status: DC | PRN
  Filled 2019-04-04: qty 0.3

## 2019-04-04 MED ORDER — BENZTROPINE MESYLATE 1 MG/ML IJ SOLN
1.0000 mg | Freq: Every day | INTRAMUSCULAR | Status: DC
  Administered 2019-04-04: 1 mg via INTRAMUSCULAR
  Filled 2019-04-04: qty 1

## 2019-04-04 MED ORDER — ONDANSETRON 4 MG PO TBDP
4.0000 mg | ORAL_TABLET | Freq: Four times a day (QID) | ORAL | Status: DC | PRN
  Filled 2019-04-04: qty 1

## 2019-04-04 MED ORDER — GLYCOPYRROLATE 1 MG PO TABS
1.0000 mg | ORAL_TABLET | ORAL | Status: DC | PRN
  Filled 2019-04-04: qty 1

## 2019-04-04 MED ORDER — ONDANSETRON HCL 4 MG/2ML IJ SOLN: 4.0000 mg | Freq: Four times a day (QID) | INTRAMUSCULAR | Status: DC | PRN

## 2019-04-04 MED ORDER — LORAZEPAM 1 MG PO TABS: 1.0000 mg | ORAL_TABLET | ORAL | Status: DC | PRN

## 2019-04-04 MED ORDER — GLYCOPYRROLATE 0.2 MG/ML IJ SOLN
0.2000 mg | INTRAMUSCULAR | Status: DC | PRN
  Administered 2019-04-04: 0.2 mg via INTRAVENOUS
  Filled 2019-04-04: qty 1

## 2019-04-04 MED ORDER — MORPHINE SULFATE (PF) 2 MG/ML IV SOLN
1.0000 mg | INTRAVENOUS | Status: DC | PRN
  Administered 2019-04-05: 1 mg via INTRAVENOUS
  Filled 2019-04-04: qty 1

## 2019-04-04 MED ORDER — POLYVINYL ALCOHOL 1.4 % OP SOLN
1.0000 [drp] | Freq: Four times a day (QID) | OPHTHALMIC | Status: DC | PRN
  Filled 2019-04-04: qty 15

## 2019-04-04 MED ORDER — GLYCOPYRROLATE 0.2 MG/ML IJ SOLN: 0.2000 mg | INTRAMUSCULAR | Status: DC | PRN

## 2019-04-04 NOTE — Consult Note (Signed)
  Spoke with Lesleigh Noe, RN (patients' nurse) this morning at [phone] reported that patient was really demented and was unsure if telepsych would be able to be done; but patient sleeping at that time.  Informed when patient woke and she was able to locate the [facility] ipad for assessment she would call back at [phone].  Awaiting call back from nurse to complete tele psych assessment (psychiatry consult)

## 2019-04-04 NOTE — Progress Notes (Signed)
PROGRESS NOTE  Vincent Sharp [id number] DOB: April 05, 1932   PCP: Burnard Bunting, MD  Patient is from: Home.  Independently ambulates at baseline.  DOA: 04/14/2019 LOS: 2  Brief Narrative / Interim history: 86 year old male with history of advanced dementia, CAD/CABG, chronic diastolic CHF, OSA not on CPAP, paroxysmal A. fib on Eliquis, colon cancer/polyp s/p polypectomy, internal hemorrhoids, HTN and HLD presenting with hematochezia and orthostatic lightheadedness.  Denies history of NSAID use.  In ED, soft blood pressures.  Hgb 12.7.  Platelet 123.  Creatinine 1.84.  BUN 32.  INR 1.4.  Typed and screened.  GI consulted and admitted for hematochezia.  Eliquis held.  Hemoglobin dropped to 9.6 and improved to 10.7, and remained stable.  Patient was evaluated by GI.  Bleeding thought to be diverticular.   Hospital course complicated by agitation/delirium in the setting of advanced dementia.  Received Haldol x2  Subjective: Was confused and agitated overnight.  Patient's daughter spent the night at the bedside.  Remains confused with random upper extremity movements, mouth breathing and snoring.  Had 1 bowel movement with some blood overnight.  Also had brief bradycardia with junctional rhythm overnight.  H&H remained stable.  Objective: Vitals:   04/03/19 2029 04/04/19 0027 04/04/19 0328 04/04/19 0757  BP: (!) 133/99 115/69 135/81 (!) 117/50  Pulse: 79 89 87   Resp: (!) 25 (!) 21 (!) 21   Temp: 97.8 F (36.6 C)   98.1 F (36.7 C)  TempSrc: Axillary   Oral  SpO2: 98% (!) 89% 92%   Weight:   102.1 kg   Height:        Intake/Output Summary (Last 24 hours) at 04/04/2019 1113 Last data filed at 04/04/2019 0758 Gross per 24 hour  Intake 650 ml  Output 1500 ml  Net -850 ml   Filed Weights   04/02/19 0301 04/03/19 0638 04/04/19 0328  Weight: 104.6 kg 103.9 kg 102.1 kg    Examination:  GENERAL: Confused and intermittently agitated. HEENT: MMM.  Vision and hearing grossly  intact.  NECK: Supple.  No apparent JVD.  RESP: Saturating in low 90s on 2 L by [place].  Snoring loud.  Fair aeration bilaterally. CVS:  RRR. Heart sounds normal.  ABD/GI/GU: Bowel sounds present. Soft. Non tender.  MSK/EXT:  Moves extremities. No apparent deformity. No edema.  Mittens on both arms. SKIN: no apparent skin lesion or wound NEURO: Confused. Random upper extremity movements PSYCH: Confused and agitated at times.   Procedures:  None  Assessment & Plan: Acute blood loss anemia due to hematochezia: Likely from diverticulosis and hemorrhoids.  Hgb 12.7 (admit)>> 9.6>> 10.7> 10.6>11.1.  -Appreciate GI recommendations  -Outpatient colonoscopy with Dr. Earlean Shawl, primary GI  -CTA abdomen and pelvis if he acutely decompensates with hematochezia -Continue holding Eliquis-discussed risk and benefit with patient's daughter at bedside. -Monitor H&H  -Secure two PIV's -Clear liquid diet -Continue D5-1/2NS-KCl at 75 cc an hour  Acute metabolic encephalopathy/delirium in patient with advanced dementia: Remains confused and agitated.  Also concern about extrapyramidal symptoms after Haldol injection.  Last dose was 2/8 at 4:30 PM.  ABG and ammonia level reassuring.  No signs of infection. Doubt seizure.   -Start Cogentin 1 mg twice daily -Psychiatry consulted -Frequent reorientation's and delirium precautions -IV Ativan as needed for agitation  Vasovagal syncope: patient had what looks like a syncopal episode the morning of 2/9.  Hemodynamically stable.  Was off telemetry monitoring during that event.  No apparent new focal neuro deficit.  CXR, CBC,  ABG and ammonia reassuring.  EKG with first-degree AVB but no acute finding.  High-sensitivity troponin marginally elevated, 199> 243 -Continue monitoring on telemetry -Recheck high-sensitivity troponin-if it continues to trend up, will curbside cardiology  Paroxysmal A. fib/first-degree AVB: on Bystolic and Aricept which could contribute to  first-degree AVB. -Continue amiodarone and Bystolic when able to swallow -Discontinue Aricept-doubt benefit at this stage. -IV metoprolol 2.5 mg as needed HR >120 while n.p.o. -Continue holding Eliquis  Chronic diastolic CHF: Echo in [id number] with EF of 55 to 60% and PAPP to 39. On Lasix at home?  BNP elevated to 850.  Appears euvolemic clinically.  CXR without pulmonary congestion.  1.1 L UOP/24 hours. -Continue holding Lasix. -Gentle IV fluid as above due to poor p.o. intake and high insensible loss from mouth breathing. -Monitor fluid status -Repeat echocardiogram  Elevated troponin: suspect demand ischemia.  No acute ischemic finding on EKG. -Check troponin today -Echocardiogram  OSA not on CPAP: ABG reassuring. -Minimal oxygen to keep saturation greater than 90%.  Abnormal thyroid panel: Hyperthyroidism?  Free T4 1.53.  TSH 0.334.  Could be due to amiodarone -Beta-blocker as above                 DVT prophylaxis: SCD in the setting of GI bleed Code Status: Full code Family Communication: Updated patient's daughter at bedside.  Discharge barrier: GI bleed and encephalopathy Patient is from: Home Final disposition: Home with family when medically stable.  Consultants: GI   Microbiology summarized: Influenza PCR negative. COVID-19 negative.  Sch Meds:  Scheduled Meds:  amiodarone  100 mg Oral Daily   atorvastatin  20 mg Oral Once per day on Sun Tue Thu Sat   And   atorvastatin  40 mg Oral Once per day on Mon Wed Fri   benztropine mesylate  1 mg Intramuscular Daily   buPROPion  150 mg Oral Daily   metoprolol tartrate  2.5 mg Intravenous Q6H   QUEtiapine  25 mg Oral QHS   sodium chloride flush  3 mL Intravenous Q12H   Continuous Infusions:  dextrose 5 % and 0.45 % NaCl with KCl 10 mEq/L 75 mL/hr at 04/04/19 0620   PRN Meds:.acetaminophen **OR** acetaminophen, albuterol, ondansetron **OR** ondansetron (ZOFRAN) IV  Antimicrobials: Anti-infectives  (From admission, onward)   None       I have personally reviewed the following labs and images: CBC: Recent Labs  Lab 04/11/2019 0751 04/20/2019 1313 04/14/2019 2344 04/02/19 0257 04/02/19 1131 04/02/19 1131 04/02/19 2152 04/03/19 0338 04/03/19 1123 04/03/19 1807 04/04/19 0805  WBC 4.3  --  3.5*  --  3.9*  --   --  5.9 6.8  --   --   NEUTROABS 2.7  --   --   --   --   --   --   --   --   --   --   HGB 12.7*   < > 9.9*   < > 9.8*   < > 11.2* 10.7* 10.6* 10.6* 11.1*  HCT 38.1*   < > 28.2*   < > 28.3*   < > 32.1* 30.3* 30.5* 30.0* 31.5*  MCV 97.2  --  92.5  --  94.3  --   --  91.8 94.1  --   --   PLT 123*  --  120*  --  129*  --   --  125* 132*  --   --    < > = values in this interval not displayed.  BMP &GFR Recent Labs  Lab 04/20/2019 0751 04/02/19 1131 04/03/19 0338 04/03/19 1123 04/04/19 0805  NA 143 138 143  --  142  K 3.7 3.6 3.9  --  3.7  CL 105 106 110  --  108  CO2 25 26 22   --  26  GLUCOSE 85 117* 122*  --  142*  BUN 32* 21 14  --  8  CREATININE 1.84* 1.36* 1.23  --  1.07  CALCIUM 8.8* 8.2* 8.8*  --  8.7*  MG  --   --   --  1.7 1.8  PHOS  --   --   --   --  3.2   Estimated Creatinine Clearance: 63.5 mL/min (by C-G formula based on SCr of 1.07 mg/dL). Liver & Pancreas: Recent Labs  Lab 04/02/2019 0751 04/04/19 0805  AST 29  --   ALT 22  --   ALKPHOS 85  --   BILITOT 1.1  --   PROT 5.8*  --   ALBUMIN 3.4* 3.3*   No results for input(s): LIPASE, AMYLASE in the last 168 hours. Recent Labs  Lab 04/03/19 1123  AMMONIA 22   Diabetic: No results for input(s): HGBA1C in the last 72 hours. Recent Labs  Lab 04/03/19 1017 04/03/19 1749  GLUCAP 146* 123*   Cardiac Enzymes: No results for input(s): CKTOTAL, CKMB, CKMBINDEX, TROPONINI in the last 168 hours. No results for input(s): PROBNP in the last 8760 hours. Coagulation Profile: Recent Labs  Lab 04/03/2019 0751 04/04/19 0805  INR 1.4* 1.1   Thyroid Function Tests: Recent Labs    04/02/19 1131  04/03/19 1807  TSH 0.334*  --   FREET4  --  1.53*   Lipid Profile: No results for input(s): CHOL, HDL, LDLCALC, TRIG, CHOLHDL, LDLDIRECT in the last 72 hours. Anemia Panel: Recent Labs    04/02/19 1131 04/03/19 1435  VITAMINB12 506 885  FOLATE 35.6 35.9  FERRITIN 87 84  TIBC 242* 287  IRON 64 33*  RETICCTPCT 1.6 2.2   Urine analysis:    Component Value Date/Time   COLORURINE YELLOW 04/11/2016 1236   APPEARANCEUR CLEAR 04/11/2016 1236   LABSPEC 1.013 04/11/2016 1236   PHURINE 5.0 04/11/2016 1236   GLUCOSEU NEGATIVE 04/11/2016 1236   [place] 04/11/2016 1236   [place] 04/11/2016 1236   [place] 04/11/2016 1236   PROTEINUR NEGATIVE 04/11/2016 1236   UROBILINOGEN 4.0 (H) 08/27/2014 0353   NITRITE NEGATIVE 04/11/2016 1236   LEUKOCYTESUR NEGATIVE 04/11/2016 1236   Sepsis Labs: Invalid input(s): PROCALCITONIN, [place]  Microbiology: Recent Results (from the past 240 hour(s))  Respiratory Panel by RT PCR (Flu A&B, Covid) - Nasopharyngeal Swab     Status: None   Collection Time: 04/13/2019 11:28 AM   Specimen: Nasopharyngeal Swab  Result Value Ref Range Status   SARS Coronavirus 2 by RT PCR NEGATIVE NEGATIVE Final    Comment: (NOTE) SARS-CoV-2 target nucleic acids are NOT DETECTED. The SARS-CoV-2 RNA is generally detectable in upper respiratoy specimens during the acute phase of infection. The lowest concentration of SARS-CoV-2 viral copies this assay can detect is 131 copies/mL. A negative result does not preclude SARS-Cov-2 infection and should not be used as the sole basis for treatment or other patient management decisions. A negative result may occur with  improper specimen collection/handling, submission of specimen other than nasopharyngeal swab, presence of viral mutation(s) within the areas targeted by this assay, and inadequate number of viral copies (<131 copies/mL). A negative result must be  combined with  clinical observations, patient history, and epidemiological information. The expected result is Negative. Fact Sheet for Patients:  PinkCheek.be Fact Sheet for Healthcare Providers:  GravelBags.it This test is not yet ap proved or cleared by the [place] FDA and  has been authorized for detection and/or diagnosis of SARS-CoV-2 by FDA under an Emergency Use Authorization (EUA). This EUA will remain  in effect (meaning this test can be used) for the duration of the COVID-19 declaration under Section 564(b)(1) of the Act, 21 U.S.C. section 360bbb-3(b)(1), unless the authorization is terminated or revoked sooner.    Influenza A by PCR NEGATIVE NEGATIVE Final   Influenza B by PCR NEGATIVE NEGATIVE Final    Comment: (NOTE) The Xpert Xpress SARS-CoV-2/FLU/RSV assay is intended as an aid in  the diagnosis of influenza from Nasopharyngeal swab specimens and  should not be used as a sole basis for treatment. Nasal washings and  aspirates are unacceptable for Xpert Xpress SARS-CoV-2/FLU/RSV  testing. Fact Sheet for Patients: PinkCheek.be Fact Sheet for Healthcare Providers: GravelBags.it This test is not yet approved or cleared by the [place] FDA and  has been authorized for detection and/or diagnosis of SARS-CoV-2 by  FDA under an Emergency Use Authorization (EUA). This EUA will remain  in effect (meaning this test can be used) for the duration of the  Covid-19 declaration under Section 564(b)(1) of the Act, 21  U.S.C. section 360bbb-3(b)(1), unless the authorization is  terminated or revoked. Performed at [facility], [place] [address] [place], [place] [address]     Radiology Studies: No results found.  Bird Swetz T. Waldo  If 7PM-7AM, please contact night-coverage [email or web address] Password [email or web address] 04/04/2019, 11:13 AM

## 2019-04-04 NOTE — Progress Notes (Signed)
Patient remains combative and stuporous.  No improvement with benztropine.  Troponin I 199> 243>> 358> 364.  Discussed with psychiatry nurse practitioner, Delphia Grates about possible extrapyramidal symptoms. Shuvon recommended trying Ativan.  Discussed about his troponin with cardiology, Dr. Percival Spanish.  Per Dr. Percival Spanish, this is nonspecific in the setting of no acute ischemic change on EKG.  I have discussed those expert opinions with patient's daughter at bedside.  At this point, we have agreed to proceed with comfort measures only.  We will try IV Ativan 1 mg but doubt this will change the course.  We will discontinue monitors, labs and frequent vitals.  I have requested patient's nurse and charge nurse to allow family members to visit the patient.   Daughter is not interested in residential hospice.  He may need morphine drip after visitation by family.

## 2019-04-04 NOTE — Progress Notes (Signed)
  Speech Language Pathology Treatment: Dysphagia  Patient Details Name: Vincent Sharp MRN: [id number] DOB: 12/18/1932 Today's Date: 04/04/2019 Time: [id number] SLP Time Calculation (min) (ACUTE ONLY): 20 min  Assessment / Plan / Recommendation Clinical Impression  Pt very drowsy and unable to be aroused this session. Pt's daughter was present for session, stating pt was awake/agitated the majority of the night, which is why he may be so drowsy this am. Provided Max cues to arouse/stimulate the pt, unfortunately unable to. Pt's daughter also reported she provided pt with wet oral swabs overnight and x1 bite of jello this morning when he was alert, with no coughing noted. Discussed concerns with MD about ability to adequately assess aspiration risk, as pt is only awake overnight. Per MD, as the pt is more awake/alert, family/staff may provide pt with oral care, and continue with clear liquid diet with caution in order to help pt maintain adequate nutrition.    HPI HPI: 86 year old male with history of advanced dementia, CAD/CABG, chronic diastolic CHF, OSA not on CPAP, paroxysmal A. fib on Eliquis, colon cancer/polyp s/p polypectomy, internal hemorrhoids, HTN and HLD presenting with hematochezia and orthostatic lightheadedness. Patient had an episode of what looks like vasovagal syncope after he got off bedside commode.; very lathargic following event.      SLP Plan  Continue with current plan of care       Recommendations  Diet recommendations: Other(comment)(clear liquids) Liquids provided via: Teaspoon Medication Administration: Via alternative means Supervision: Full supervision/cueing for compensatory strategies Compensations: Minimize environmental distractions;Slow rate;Small sips/bites Postural Changes and/or Swallow Maneuvers: Seated upright 90 degrees                Oral Care Recommendations: Oral care prior to ice chip/H20 Follow up Recommendations: 24 hour  supervision/assistance SLP Visit Diagnosis: Dysphagia, oropharyngeal phase (R13.12) Plan: Continue with current plan of care       [place], Student SLP Office: [phone]  04/04/2019, 9:24 AM

## 2019-04-04 NOTE — Progress Notes (Addendum)
Patient started to have junctional rhythm tonight and a pause of 1.81 sec. Starting to have SB episodes at times, which were noted yesterday as well.    MD on call has been notified.

## 2019-04-04 NOTE — Progress Notes (Signed)
Goal of care discussion  Extensive discussion with patient's daughter.  Patient with history of advanced dementia, CAD/CABG, chronic diastolic CHF, OSA not on CPAP, paroxysmal A. fib on Eliquis, colon cancer/polyp s/p polypectomy, internal hemorrhoids, HTN and HLD presenting with hematochezia and orthostatic lightheadedness. At baseline, he ambulates independently and able to manage his ADLs.  Patient seems to be stable from hematochezia standpoint.  Bleeding seems to have subsided.  H&H relatively stable.  However, patient developed acute agitation with this hospitalization and required IM Haldol and IV Ativan.  Since he received IM Haldol on 2/8, patient has been stuporous, confused and restless.  He has been mouth breathing with random right upper extremity movements.  Intermittently gasping for breath even on oxygen.  Patient has been n.p.o. out of concern for aspiration.    Patient also had what looks like vasovagal syncope on 2/9.  Work-up revealed mildly elevated troponin and first-degree AV block on EKG.  There is no acute ischemic finding on EKG.  QTC prolonged but in the setting of wide QRS.  He is chest x-ray, ABG, electrolytes and CBC were reassuring.  I discussed about treatment options ranging from full scope of care to comfort measures.  Patient's daughter is concerned about his current mental status, agitation, restlessness, work of breathing and distress.  She understand that he might not be a candidate for aggressive procedures such as heart catheterization.  She also understand that CPR and intubation would pose more harm than benefit.  We both agreed DNR and DNI would be appropriate at this time.   I have started patient on benztropine this morning out of concern for extrapyramidal symptoms from Haldol.  I have also consulted psych this morning.  If no additional insight or recommendation from psych, patient's daughter would like to focus on patient's comfort by initiating full comfort  measures this afternoon.

## 2019-04-04 NOTE — Consult Note (Signed)
  Spoke with Dr. Cyndia Skeeters related to concerns of patient abnormal movement of arms, restlessness, and agitation after patient was given Haldol 3 days ago.  Informed patient was given Cogentin IM this morning but no change.  Discussed that Cogentin can be given to decrease/prevent EPS and Ativan can be given also that would work with akathisia.  States he will try Ativan which will also help with patient anxiety.  Will discontinue psychiatry consult and call back if have any other questions.      Akathisia: Propranolol, Benztropine, Benzodiazepines work well with treatment.

## 2019-04-05 DIAGNOSIS — Z515 Encounter for palliative care: Secondary | ICD-10-CM

## 2019-04-05 DIAGNOSIS — Z66 Do not resuscitate: Secondary | ICD-10-CM

## 2019-04-05 DIAGNOSIS — Z789 Other specified health status: Secondary | ICD-10-CM

## 2019-04-05 MED ORDER — MORPHINE 100MG IN NS 100ML (1MG/ML) PREMIX INFUSION
2.0000 mg/h | INTRAVENOUS | Status: DC
  Administered 2019-04-05: 2 mg/h via INTRAVENOUS
  Filled 2019-04-05: qty 100

## 2019-04-05 MED ORDER — MORPHINE SULFATE (PF) 2 MG/ML IV SOLN: 2.0000 mg | INTRAVENOUS | Status: DC | PRN

## 2019-04-23 NOTE — Death Summary Note (Signed)
DEATH SUMMARY   Patient Details  Name: Vincent Sharp MRN: [id number] DOB: [birth year]  Admission/Discharge Information   Admit Date:  2019/04/23  Date of Death: Date of Death: 2019-04-27  Time of Death: Time of Death: 08-Jun-1814  Length of Stay: 3  Referring Physician: Burnard Bunting, MD   Reason(s) for Hospitalization  Gastrointestinal bleed  Diagnoses  Preliminary cause of death: Acute metabolic encephalopathy Secondary Diagnoses (including complications and co-morbidities):  End-of-life care/comfort measures only/DNR/DNI  Acute blood loss anemia due to hematochezia: Likely from diverticulosis and hemorrhoids.  H&H stable.  Acute metabolic encephalopathy/delirium in patient with advanced dementia/akathisia  Vasovagal syncope/elevated troponin: patient had what looks like a syncopal episode the morning of 2/9.    Paroxysmal A. fib/first-degree AVB:   Chronic diastolic CHF: Echo in 99991111 with EF of 55 to 60% and PAPP to 39.   OSA not on CPAP:  Abnormal thyroid panel: Hyperthyroidism?  Free T4 1.53.  TSH 0.334.   Brief Hospital Course (including significant findings, care, treatment, and services provided and events leading to death)  Vincent Sharp is a 86 y.o. year old male with history of advanced dementia, CAD/CABG, chronic diastolic CHF, OSA not on CPAP, paroxysmal A. fib on Eliquis, colon cancer/polyp s/p polypectomy, internal hemorrhoids, HTN and HLD presenting with hematochezia and orthostatic lightheadedness.  Denies history of NSAID use.  In ED, soft blood pressures.  Hgb 12.7.  Platelet 123.  Creatinine 1.84.  BUN 32.  INR 1.4.  Typed and screened.  GI consulted and admitted for hematochezia.  Eliquis held.  Hemoglobin dropped to 9.6 and improved to 10.7.   Patient was evaluated by GI.  Bleeding thought to be diverticular.  H&H remained stable.  GI recommended outpatient follow-up with his primary GI.   Hospital course complicated by agitation/delirium in  the setting of advanced dementia.  Received Haldol x2 and ativan on 2/8 without improvement in his agitation/delirium.  He became stuporous with what looks like akathisia.  Started on benztropine and IV Ativan per psych recommendation without significant improvement.    Patient was eventually transitioned to full comfort care on 04/04/2019 after extensive discussion with family.   He passed away on 2019/04/27 at 6:18 PM with family at bedside.   Pertinent Labs and Studies  Significant Diagnostic Studies DG Chest Port 1 View  Result Date: 04/03/2019 CLINICAL DATA:  SOB (shortness of breath),hx chf EXAM: PORTABLE CHEST - 1 VIEW COMPARISON:  04/02/2019 FINDINGS: Lungs are clear. Stable cardiomegaly. Previous CABG. Aortic Atherosclerosis (ICD10-170.0). No effusion. No pneumothorax. Sternotomy wires. IMPRESSION: Stable cardiomegaly. No acute disease. Electronically Signed   By: Lucrezia Europe M.D.   On: 04/03/2019 11:02   DG CHEST PORT 1 VIEW  Result Date: 04/02/2019 CLINICAL DATA:  Hypoxia. EXAM: PORTABLE CHEST 1 VIEW COMPARISON:  Chest x-ray dated May 16, 2018. FINDINGS: Stable cardiomegaly status post CABG. Atherosclerotic calcification of the aortic arch. Normal pulmonary vascularity. No focal consolidation, pleural effusion, or pneumothorax. No acute osseous abnormality. IMPRESSION: No active disease. Electronically Signed   By: Titus Dubin M.D.   On: 04/02/2019 11:56    Microbiology Recent Results (from the past 240 hour(s))  Respiratory Panel by RT PCR (Flu A&B, Covid) - Nasopharyngeal Swab     Status: None   Collection Time: 2019/04/23 11:28 AM   Specimen: Nasopharyngeal Swab  Result Value Ref Range Status   SARS Coronavirus 2 by RT PCR NEGATIVE NEGATIVE Final    Comment: (NOTE) SARS-CoV-2 target nucleic acids are NOT DETECTED. The SARS-CoV-2  RNA is generally detectable in upper respiratoy specimens during the acute phase of infection. The lowest concentration of SARS-CoV-2 viral copies  this assay can detect is 131 copies/mL. A negative result does not preclude SARS-Cov-2 infection and should not be used as the sole basis for treatment or other patient management decisions. A negative result may occur with  improper specimen collection/handling, submission of specimen other than nasopharyngeal swab, presence of viral mutation(s) within the areas targeted by this assay, and inadequate number of viral copies (<131 copies/mL). A negative result must be combined with clinical observations, patient history, and epidemiological information. The expected result is Negative. Fact Sheet for Patients:  PinkCheek.be Fact Sheet for Healthcare Providers:  GravelBags.it This test is not yet ap proved or cleared by the [place] FDA and  has been authorized for detection and/or diagnosis of SARS-CoV-2 by FDA under an Emergency Use Authorization (EUA). This EUA will remain  in effect (meaning this test can be used) for the duration of the COVID-19 declaration under Section 564(b)(1) of the Act, 21 U.S.C. section 360bbb-3(b)(1), unless the authorization is terminated or revoked sooner.    Influenza A by PCR NEGATIVE NEGATIVE Final   Influenza B by PCR NEGATIVE NEGATIVE Final    Comment: (NOTE) The Xpert Xpress SARS-CoV-2/FLU/RSV assay is intended as an aid in  the diagnosis of influenza from Nasopharyngeal swab specimens and  should not be used as a sole basis for treatment. Nasal washings and  aspirates are unacceptable for Xpert Xpress SARS-CoV-2/FLU/RSV  testing. Fact Sheet for Patients: PinkCheek.be Fact Sheet for Healthcare Providers: GravelBags.it This test is not yet approved or cleared by the [place] FDA and  has been authorized for detection and/or diagnosis of SARS-CoV-2 by  FDA under an Emergency Use Authorization (EUA). This EUA will remain  in  effect (meaning this test can be used) for the duration of the  Covid-19 declaration under Section 564(b)(1) of the Act, 21  U.S.C. section 360bbb-3(b)(1), unless the authorization is  terminated or revoked. Performed at [facility], [place] [address], [place], [place] [address]     Lab Basic Metabolic Panel: Recent Labs  Lab 04/10/2019 [phone] 04/02/19 1131 04/03/19 0338 04/03/19 1123 04/04/19 0805  NA 143 138 143  --  142  K 3.7 3.6 3.9  --  3.7  CL 105 106 110  --  108  CO2 25 26 22   --  26  GLUCOSE 85 117* 122*  --  142*  BUN 32* 21 14  --  8  CREATININE 1.84* 1.36* 1.23  --  1.07  CALCIUM 8.8* 8.2* 8.8*  --  8.7*  MG  --   --   --  1.7 1.8  PHOS  --   --   --   --  3.2   Liver Function Tests: Recent Labs  Lab 04/22/2019 0751 04/04/19 0805  AST 29  --   ALT 22  --   ALKPHOS 85  --   BILITOT 1.1  --   PROT 5.8*  --   ALBUMIN 3.4* 3.3*   No results for input(s): LIPASE, AMYLASE in the last 168 hours. Recent Labs  Lab 04/03/19 1123  AMMONIA 22   CBC: Recent Labs  Lab 04/13/2019 0751  1313 04/16/2019 2344 04/02/19 0257 04/02/19 1131 04/02/19 1131 04/02/19 2152 04/03/19 0338 04/03/19 1123 04/03/19 1807 04/04/19 0805  WBC 4.3  --  3.5*  --  3.9*  --   --  5.9 6.8  --   --  NEUTROABS 2.7  --   --   --   --   --   --   --   --   --   --   HGB 12.7*   < > 9.9*   < > 9.8*   < > 11.2* 10.7* 10.6* 10.6* 11.1*  HCT 38.1*   < > 28.2*   < > 28.3*   < > 32.1* 30.3* 30.5* 30.0* 31.5*  MCV 97.2  --  92.5  --  94.3  --   --  91.8 94.1  --   --   PLT 123*  --  120*  --  129*  --   --  125* 132*  --   --    < > = values in this interval not displayed.   Cardiac Enzymes: No results for input(s): CKTOTAL, CKMB, CKMBINDEX, TROPONINI in the last 168 hours. Sepsis Labs: Recent Labs  Lab 03/26/2019 2344 04/02/19 1131 04/03/19 0338 04/03/19 1123  WBC 3.5* 3.9* 5.9 6.8    Procedures/Operations  None    T  04-29-2019, 7:26 PM

## 2019-04-23 NOTE — Progress Notes (Signed)
PROGRESS NOTE  Vincent Sharp [id number] DOB: [birth year]   PCP: Burnard Bunting, MD  Patient is from: Home.  Independently ambulates at baseline.  DOA: 03/31/2019 LOS: 3  Brief Narrative / Interim history: 86 year old male with history of advanced dementia, CAD/CABG, chronic diastolic CHF, OSA not on CPAP, paroxysmal A. fib on Eliquis, colon cancer/polyp s/p polypectomy, internal hemorrhoids, HTN and HLD presenting with hematochezia and orthostatic lightheadedness.  Denies history of NSAID use.  In ED, soft blood pressures.  Hgb 12.7.  Platelet 123.  Creatinine 1.84.  BUN 32.  INR 1.4.  Typed and screened.  GI consulted and admitted for hematochezia.  Eliquis held.  Hemoglobin dropped to 9.6 and improved to 10.7.   Patient was evaluated by GI.  Bleeding thought to be diverticular.  H&H remained stable.  GI recommended outpatient follow-up with his primary GI.   Hospital course complicated by agitation/delirium in the setting of advanced dementia.  Received Haldol x2 and ativan on 2/8 without improvement in his agitation/delirium.  He became stuporous with what looks like akathisia.  Started on benztropine and IV Ativan per psych recommendation without significant improvement.    Patient was eventually transitioned to full comfort care on 04/03/2018 after extensive discussion with family.   Subjective: No major events overnight or this morning.  Remains stuporous with significant work of breathing.   Objective: Vitals:   04/04/19 0027 04/04/19 0328 04/04/19 0757 04-14-19 0417  BP: 115/69 135/81 (!) 117/50   Pulse: 89 87    Resp: (!) 21 (!) 21  (!) 24  Temp:   98.1 F (36.7 C) 99.1 F (37.3 C)  TempSrc:   Oral Oral  SpO2: (!) 89% 92%  98%  Weight:  102.1 kg    Height:        Intake/Output Summary (Last 24 hours) at 2019-04-14 1623 Last data filed at 2019/04/14 0655 Gross per 24 hour  Intake 0 ml  Output 110 ml  Net -110 ml   Filed Weights   04/02/19 0301 04/03/19 0638  04/04/19 0328  Weight: 104.6 kg 103.9 kg 102.1 kg    Examination:  GENERAL: Confused, stuporous RESP: Increased work of breathing and snoring. CVS:  RRR. Heart sounds normal.  MSK/EXT:  Moves extremities randomly SKIN: no apparent skin lesion or wound NEURO: Drowsy, confused PSYCH: Appears to be in distress.  Restlessness and stuporous.  Procedures:  None  Assessment & Plan: End-of-life care/comfort measures only/DNR/DNI -Start morphine infusion-RN to titrate -As needed Ativan, Haldol, glycopyrrolate and Zofran -Discontinue oxygen monitors. -Anticipate in-hospital death  Acute blood loss anemia due to hematochezia: Likely from diverticulosis and hemorrhoids.  H&H stable.  Acute metabolic encephalopathy/delirium in patient with advanced dementia/akathisia  Vasovagal syncope/elevated troponin: patient had what looks like a syncopal episode the morning of 2/9.    Paroxysmal A. fib/first-degree AVB:   Chronic diastolic CHF: Echo in 99991111 with EF of 55 to 60% and PAPP to 39.   OSA not on CPAP:  Abnormal thyroid panel: Hyperthyroidism?  Free T4 1.53.  TSH 0.334.       DVT prophylaxis: None Code Status: DNR/DNI Family Communication: Updated patient's wife and 2 daughters at bedside.  Discharge barrier: Actively dying. Final disposition: Anticipate in-hospital death  Consultants: GI   Microbiology summarized: Influenza PCR negative. COVID-19 negative.  Sch Meds:  Scheduled Meds:  Continuous Infusions: . chlorproMAZINE (THORAZINE) IV    . morphine 8 mg/hr (2019-04-14 1550)   PRN Meds:.acetaminophen **OR** acetaminophen, chlorproMAZINE (THORAZINE) IV, glycopyrrolate **OR** glycopyrrolate **OR** glycopyrrolate,  haloperidol **OR** haloperidol **OR** haloperidol lactate, LORazepam **OR** LORazepam **OR** LORazepam, LORazepam, morphine injection, ondansetron **OR** ondansetron (ZOFRAN) IV, polyvinyl alcohol  Antimicrobials: Anti-infectives (From admission, onward)    None       I have personally reviewed the following labs and images: CBC: Recent Labs  Lab 03/31/2019 0751 04/13/2019 1313 04/08/2019 2344 04/02/19 0257 04/02/19 1131 04/02/19 1131 04/02/19 2152 04/03/19 0338 04/03/19 1123 04/03/19 1807 04/04/19 0805  WBC 4.3  --  3.5*  --  3.9*  --   --  5.9 6.8  --   --   NEUTROABS 2.7  --   --   --   --   --   --   --   --   --   --   HGB 12.7*   < > 9.9*   < > 9.8*   < > 11.2* 10.7* 10.6* 10.6* 11.1*  HCT 38.1*   < > 28.2*   < > 28.3*   < > 32.1* 30.3* 30.5* 30.0* 31.5*  MCV 97.2  --  92.5  --  94.3  --   --  91.8 94.1  --   --   PLT 123*  --  120*  --  129*  --   --  125* 132*  --   --    < > = values in this interval not displayed.   BMP &GFR Recent Labs  Lab 04/12/2019 0751 04/02/19 1131 04/03/19 0338 04/03/19 1123 04/04/19 0805  NA 143 138 143  --  142  K 3.7 3.6 3.9  --  3.7  CL 105 106 110  --  108  CO2 25 26 22   --  26  GLUCOSE 85 117* 122*  --  142*  BUN 32* 21 14  --  8  CREATININE 1.84* 1.36* 1.23  --  1.07  CALCIUM 8.8* 8.2* 8.8*  --  8.7*  MG  --   --   --  1.7 1.8  PHOS  --   --   --   --  3.2   Estimated Creatinine Clearance: 63.5 mL/min (by C-G formula based on SCr of 1.07 mg/dL). Liver & Pancreas: Recent Labs  Lab 04/04/2019 0751 04/04/19 0805  AST 29  --   ALT 22  --   ALKPHOS 85  --   BILITOT 1.1  --   PROT 5.8*  --   ALBUMIN 3.4* 3.3*   No results for input(s): LIPASE, AMYLASE in the last 168 hours. Recent Labs  Lab 04/03/19 1123  AMMONIA 22   Diabetic: No results for input(s): HGBA1C in the last 72 hours. Recent Labs  Lab 04/03/19 1017 04/03/19 1749  GLUCAP 146* 123*   Cardiac Enzymes: No results for input(s): CKTOTAL, CKMB, CKMBINDEX, TROPONINI in the last 168 hours. No results for input(s): PROBNP in the last 8760 hours. Coagulation Profile: Recent Labs  Lab 03/31/2019 0751 04/04/19 0805  INR 1.4* 1.1   Thyroid Function Tests: Recent Labs    04/03/19 1807  FREET4 1.53*   Lipid  Profile: No results for input(s): CHOL, HDL, LDLCALC, TRIG, CHOLHDL, LDLDIRECT in the last 72 hours. Anemia Panel: Recent Labs    04/03/19 1435  VITAMINB12 885  FOLATE 35.9  FERRITIN 84  TIBC 287  IRON 33*  RETICCTPCT 2.2   Urine analysis:    Component Value Date/Time   COLORURINE YELLOW 04/11/2016 1236   APPEARANCEUR CLEAR 04/11/2016 1236   LABSPEC 1.013 04/11/2016 1236   PHURINE 5.0 04/11/2016 1236  GLUCOSEU NEGATIVE 04/11/2016 1236   [place] 04/11/2016 1236   [place] 04/11/2016 1236   [place] 04/11/2016 1236   PROTEINUR NEGATIVE 04/11/2016 1236   UROBILINOGEN 4.0 (H) 08/27/2014 0353   NITRITE NEGATIVE 04/11/2016 1236   LEUKOCYTESUR NEGATIVE 04/11/2016 1236   Sepsis Labs: Invalid input(s): PROCALCITONIN, [place]  Microbiology: Recent Results (from the past 240 hour(s))  Respiratory Panel by RT PCR (Flu A&B, Covid) - Nasopharyngeal Swab     Status: None   Collection Time: 03/31/2019 11:28 AM   Specimen: Nasopharyngeal Swab  Result Value Ref Range Status   SARS Coronavirus 2 by RT PCR NEGATIVE NEGATIVE Final    Comment: (NOTE) SARS-CoV-2 target nucleic acids are NOT DETECTED. The SARS-CoV-2 RNA is generally detectable in upper respiratoy specimens during the acute phase of infection. The lowest concentration of SARS-CoV-2 viral copies this assay can detect is 131 copies/mL. A negative result does not preclude SARS-Cov-2 infection and should not be used as the sole basis for treatment or other patient management decisions. A negative result may occur with  improper specimen collection/handling, submission of specimen other than nasopharyngeal swab, presence of viral mutation(s) within the areas targeted by this assay, and inadequate number of viral copies (<131 copies/mL). A negative result must be combined with clinical observations, patient history, and epidemiological information. The expected result is Negative. Fact Sheet for  Patients:  PinkCheek.be Fact Sheet for Healthcare Providers:  GravelBags.it This test is not yet ap proved or cleared by the [place] FDA and  has been authorized for detection and/or diagnosis of SARS-CoV-2 by FDA under an Emergency Use Authorization (EUA). This EUA will remain  in effect (meaning this test can be used) for the duration of the COVID-19 declaration under Section 564(b)(1) of the Act, 21 U.S.C. section 360bbb-3(b)(1), unless the authorization is terminated or revoked sooner.    Influenza A by PCR NEGATIVE NEGATIVE Final   Influenza B by PCR NEGATIVE NEGATIVE Final    Comment: (NOTE) The Xpert Xpress SARS-CoV-2/FLU/RSV assay is intended as an aid in  the diagnosis of influenza from Nasopharyngeal swab specimens and  should not be used as a sole basis for treatment. Nasal washings and  aspirates are unacceptable for Xpert Xpress SARS-CoV-2/FLU/RSV  testing. Fact Sheet for Patients: PinkCheek.be Fact Sheet for Healthcare Providers: GravelBags.it This test is not yet approved or cleared by the [place] FDA and  has been authorized for detection and/or diagnosis of SARS-CoV-2 by  FDA under an Emergency Use Authorization (EUA). This EUA will remain  in effect (meaning this test can be used) for the duration of the  Covid-19 declaration under Section 564(b)(1) of the Act, 21  U.S.C. section 360bbb-3(b)(1), unless the authorization is  terminated or revoked. Performed at [facility], [place] [address], [place], [place] [address]     Radiology Studies: No results found.  Casyn Becvar T. [place]  If 7PM-7AM, please contact night-coverage [email or web address] Password [facility] 04/10/19, 4:23 PM

## 2019-04-23 DEATH — deceased

## 2019-04-24 ENCOUNTER — Ambulatory Visit: Payer: Medicare Other | Admitting: Cardiology
